# Patient Record
Sex: Female | Born: 1970 | Race: White | Hispanic: No | Marital: Married | Smoking: Never smoker
Health system: Southern US, Community
[De-identification: ages and names within clinical notes are randomized; demographics above are authoritative.]

## PROBLEM LIST (undated history)

## (undated) DIAGNOSIS — I10 Essential (primary) hypertension: Secondary | ICD-10-CM

## (undated) DIAGNOSIS — F32A Depression, unspecified: Secondary | ICD-10-CM

## (undated) DIAGNOSIS — E119 Type 2 diabetes mellitus without complications: Secondary | ICD-10-CM

## (undated) DIAGNOSIS — R112 Nausea with vomiting, unspecified: Secondary | ICD-10-CM

## (undated) DIAGNOSIS — Z9889 Other specified postprocedural states: Secondary | ICD-10-CM

## (undated) DIAGNOSIS — K219 Gastro-esophageal reflux disease without esophagitis: Secondary | ICD-10-CM

## (undated) HISTORY — DX: Gastro-esophageal reflux disease without esophagitis: K21.9

## (undated) HISTORY — PX: DILATION AND CURETTAGE OF UTERUS: SHX78

## (undated) HISTORY — DX: Depression, unspecified: F32.A

## (undated) HISTORY — PX: CHOLECYSTECTOMY: SHX55

---

## 2010-05-22 HISTORY — PX: KNEE ARTHROSCOPY W/ MENISCAL REPAIR: SHX1877

## 2018-01-10 MED FILL — UNIFINE PENTIPS 32GX5/32: 32G X 4 MM | 90 days supply | Qty: 300 | Fill #0

## 2018-01-10 MED FILL — METOPROLOL TARTRATE 50 MG T: 50 | 90 days supply | Qty: 180 | Fill #0

## 2018-01-10 MED FILL — HYDROCHLOROTHIAZIDE 12.5 MG: 12.5 | 90 days supply | Qty: 90 | Fill #0

## 2018-01-10 MED FILL — TELMISARTAN 20 MG TABS: 20 | 90 days supply | Qty: 90 | Fill #0

## 2018-01-10 MED FILL — UNIFINE PENTIPS 32GX5/32": 32G X 4 MM | 90 days supply | Qty: 300 | Fill #0

## 2018-02-11 MED FILL — ATORVASTATIN CALCIUM 20 MG: 20 | 90 days supply | Qty: 90 | Fill #0

## 2018-02-25 MED FILL — TRULICITY 1.5 MG/0.5 ML PEN: 1.5 | 28 days supply | Qty: 2 | Fill #0

## 2018-03-03 MED FILL — INVOKANA 300 MG TABLET: 300 | 90 days supply | Qty: 90 | Fill #0

## 2018-03-28 MED FILL — metFORMIN HCL 1000 MG TABS: 1000 | 90 days supply | Qty: 180 | Fill #0

## 2018-03-28 MED FILL — TRULICITY 1.5 MG/0.5 ML PEN: 1.5 | 28 days supply | Qty: 2 | Fill #1

## 2018-03-29 MED FILL — LANTUS SOLOSTAR 100 UNITS/M: 100 | 30 days supply | Qty: 30 | Fill #0

## 2018-03-29 MED FILL — GLIMEPIRIDE 4 MG TABLET: 4 | 90 days supply | Qty: 180 | Fill #0

## 2018-03-29 MED FILL — METOPROLOL TARTRATE 50 MG T: 50 | 30 days supply | Qty: 60 | Fill #0

## 2018-04-28 ENCOUNTER — Other Ambulatory Visit: Payer: Self-pay

## 2018-04-28 ENCOUNTER — Ambulatory Visit (INDEPENDENT_AMBULATORY_CARE_PROVIDER_SITE_OTHER): Payer: No Typology Code available for payment source | Admitting: Internal Medicine

## 2018-04-28 ENCOUNTER — Encounter: Payer: Self-pay | Admitting: Internal Medicine

## 2018-04-28 VITALS — BP 116/61 | HR 66 | Temp 98.2°F | Wt 245.0 lb

## 2018-04-28 DIAGNOSIS — E1159 Type 2 diabetes mellitus with other circulatory complications: Secondary | ICD-10-CM | POA: Insufficient documentation

## 2018-04-28 DIAGNOSIS — Z6839 Body mass index (BMI) 39.0-39.9, adult: Secondary | ICD-10-CM

## 2018-04-28 DIAGNOSIS — I10 Essential (primary) hypertension: Secondary | ICD-10-CM

## 2018-04-28 DIAGNOSIS — M7918 Myalgia, other site: Secondary | ICD-10-CM

## 2018-04-28 DIAGNOSIS — E785 Hyperlipidemia, unspecified: Secondary | ICD-10-CM | POA: Diagnosis not present

## 2018-04-28 DIAGNOSIS — E119 Type 2 diabetes mellitus without complications: Secondary | ICD-10-CM

## 2018-04-28 DIAGNOSIS — E1142 Type 2 diabetes mellitus with diabetic polyneuropathy: Secondary | ICD-10-CM | POA: Diagnosis not present

## 2018-04-28 DIAGNOSIS — F411 Generalized anxiety disorder: Secondary | ICD-10-CM

## 2018-04-28 DIAGNOSIS — Z0000 Encounter for general adult medical examination without abnormal findings: Secondary | ICD-10-CM

## 2018-04-28 DIAGNOSIS — E1149 Type 2 diabetes mellitus with other diabetic neurological complication: Secondary | ICD-10-CM | POA: Insufficient documentation

## 2018-04-28 DIAGNOSIS — I152 Hypertension secondary to endocrine disorders: Secondary | ICD-10-CM | POA: Insufficient documentation

## 2018-04-28 DIAGNOSIS — E538 Deficiency of other specified B group vitamins: Secondary | ICD-10-CM

## 2018-04-28 DIAGNOSIS — K219 Gastro-esophageal reflux disease without esophagitis: Secondary | ICD-10-CM

## 2018-04-28 DIAGNOSIS — Z794 Long term (current) use of insulin: Secondary | ICD-10-CM

## 2018-04-28 DIAGNOSIS — J45909 Unspecified asthma, uncomplicated: Secondary | ICD-10-CM

## 2018-04-28 DIAGNOSIS — Z975 Presence of (intrauterine) contraceptive device: Secondary | ICD-10-CM

## 2018-04-28 DIAGNOSIS — E1169 Type 2 diabetes mellitus with other specified complication: Secondary | ICD-10-CM | POA: Insufficient documentation

## 2018-04-28 LAB — POCT GLYCOSYLATED HEMOGLOBIN (HGB A1C): Hemoglobin A1C: 6.7 % — AB (ref 4.0–5.6)

## 2018-04-28 LAB — GLUCOSE, CAPILLARY: Glucose-Capillary: 93 mg/dL (ref 70–99)

## 2018-04-28 MED ORDER — MELOXICAM 15 MG PO TABS: 15.0000 mg | ORAL_TABLET | Freq: Every day | ORAL | 1 refills | Status: DC | PRN

## 2018-04-28 MED ORDER — ATORVASTATIN CALCIUM 20 MG PO TABS: 20.0000 mg | ORAL_TABLET | Freq: Every day | ORAL | 3 refills | Status: DC

## 2018-04-28 MED ORDER — METOPROLOL TARTRATE 50 MG PO TABS: 50.0000 mg | ORAL_TABLET | Freq: Two times a day (BID) | ORAL | 3 refills | Status: DC

## 2018-04-28 MED ORDER — OLMESARTAN MEDOXOMIL 20 MG PO TABS: 20.0000 mg | ORAL_TABLET | Freq: Every day | ORAL | 3 refills | Status: DC

## 2018-04-28 MED ORDER — GLIMEPIRIDE 4 MG PO TABS: 4.0000 mg | ORAL_TABLET | Freq: Every day | ORAL | 3 refills | Status: DC

## 2018-04-28 MED ORDER — INSULIN GLARGINE 100 UNIT/ML SOLOSTAR PEN: PEN_INJECTOR | SUBCUTANEOUS | 3 refills | Status: DC

## 2018-04-28 MED ORDER — HYDROCHLOROTHIAZIDE 25 MG PO TABS: 25.0000 mg | ORAL_TABLET | Freq: Every day | ORAL | 3 refills | Status: DC

## 2018-04-28 MED ORDER — ALBUTEROL SULFATE 108 (90 BASE) MCG/ACT IN AEPB: 2.0000 | INHALATION_SPRAY | RESPIRATORY_TRACT | 4 refills | Status: DC | PRN

## 2018-04-28 MED ORDER — DULAGLUTIDE 1.5 MG/0.5ML ~~LOC~~ SOAJ: 1.5000 mg | SUBCUTANEOUS | 3 refills | Status: DC

## 2018-04-28 MED ORDER — BETAMETHASONE DIPROPIONATE 0.05 % EX CREA: TOPICAL_CREAM | Freq: Two times a day (BID) | CUTANEOUS | 0 refills | Status: DC

## 2018-04-28 MED ORDER — OMEPRAZOLE 20 MG PO CPDR: 20.0000 mg | DELAYED_RELEASE_CAPSULE | Freq: Every day | ORAL | 3 refills | Status: DC

## 2018-04-28 MED ORDER — METFORMIN HCL 1000 MG PO TABS: 1000.0000 mg | ORAL_TABLET | Freq: Two times a day (BID) | ORAL | 3 refills | Status: DC

## 2018-04-28 MED FILL — ATORVASTATIN CALCIUM 20 MG: 20 | 90 days supply | Qty: 90 | Fill #0

## 2018-04-28 MED FILL — OLMESARTAN MEDOXOMIL 20 MG: 20 | 90 days supply | Qty: 90 | Fill #0

## 2018-04-28 MED FILL — OMEPRAZOLE 20 MG CPDR: 20 | 90 days supply | Qty: 90 | Fill #0

## 2018-04-28 MED FILL — VENTOLIN HFA 90 MCG INHALER: 108 (90 BAS | 30 days supply | Qty: 18 | Fill #0

## 2018-04-28 MED FILL — HYDROCHLOROTHIAZIDE 25 MG T: 25 | 90 days supply | Qty: 90 | Fill #0

## 2018-04-28 MED FILL — BETAMETHASONE DP 0.05% CRM: 0.05 | 10 days supply | Qty: 30 | Fill #0

## 2018-04-28 MED FILL — METOPROLOL TARTRATE 50 MG T: 50 | 90 days supply | Qty: 180 | Fill #0

## 2018-04-28 MED FILL — TRULICITY 1.5 MG/0.5 ML PEN: 1.5 | 84 days supply | Qty: 6 | Fill #0

## 2018-04-28 MED FILL — LANTUS SOLOSTAR 100 UNITS/M: 100 | 28 days supply | Qty: 30 | Fill #0

## 2018-04-28 MED FILL — MELOXICAM 15 MG TABLET: 15 | 90 days supply | Qty: 90 | Fill #0
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
## 2018-04-28 NOTE — Assessment & Plan Note (Signed)
This problem is chronic and stable.  She is having no issues with the IUD but would like to get back into gynecological care for routine care.  PLAN : refer to gyn

## 2018-04-28 NOTE — Assessment & Plan Note (Signed)
This problem is chronic and stable.  Her B12 level was 227 in March 2019.  Subsequently, she started taking oral over-the-counter B12 supplements.  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  PLAN : Continue B12 supplements Check B12 level next appointment

## 2018-04-28 NOTE — Assessment & Plan Note (Signed)
This problem is chronic and stable.  She does not have a history of asthma or COPD.  However, she states anytime she gets a URI that it goes into her lower pulmonary tract and causes a pneumonia.  The last time she thinks was about 5 years ago but she has had other episodes of pneumonia.  She likes to keep the albuterol inhaler to help whenever she gets a URI to help with the wheezing and the symptoms.  PLAN : albuterol MDI PRN

## 2018-04-28 NOTE — Assessment & Plan Note (Signed)
This problem is chronic and stable.  She is taking omeprazole 20 mg daily over-the-counter and request that it be switched to prescription.  She is having no side effects to this medication and it is controlling her symptoms well.  PLAN : omeprazole 20 QD

## 2018-04-28 NOTE — Assessment & Plan Note (Addendum)
This problem is chronic and stable.  Her BMI is about 39.  Associated comorbidities include diabetes, hypertension, and hyperlipidemia.  She did mention that she knows and wants to lose weight but we were not able to get into the specifics today.  PLAN : follow

## 2018-04-28 NOTE — Patient Instructions (Addendum)
1. Think about continuous glucose monitoring 2. I referred you to Gynecology and optometry 3. I will refill your medicines this afternoon 4. Let me know when you need a xanax refill 5. Use the steroid cream only on the lesion - not on normal skin - for 2 weeks 6. Let me know if you need a new meter

## 2018-04-29 DIAGNOSIS — M7918 Myalgia, other site: Secondary | ICD-10-CM | POA: Insufficient documentation

## 2018-04-29 DIAGNOSIS — Z0000 Encounter for general adult medical examination without abnormal findings: Secondary | ICD-10-CM | POA: Insufficient documentation

## 2018-04-29 NOTE — Assessment & Plan Note (Signed)
This problem is chronic and stable.  She is on atorvastatin 20 in the morning, a moderate intensity statin for primary prevention.  Her 10-year cardiovascular risk is 7.26%, just below the cutoff of 7.5 for a high intensity statin.  She is having no myalgias to her atorvastatin.  PLAN:  Cont current meds

## 2018-04-29 NOTE — Assessment & Plan Note (Signed)
This problem is chronic and stable.  She is on olmesartan 20 daily, HCTZ 25 daily, and metoprolol 50 twice daily.  Her blood pressure is at goal.  She has no side effects to these medications.  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  I would like to discuss changing her metoprolol to daily dosing to simplify dosing regimen.  PLAN:  Cont current meds   BP Readings from Last 3 Encounters:  04/28/18 116/61

## 2018-04-29 NOTE — Assessment & Plan Note (Signed)
This problem is chronic and stable.  She uses Mobic 15 mg as needed for joint pain and requesting refill.  She is on a PPI and her creatinine is normal.  She had right knee arthroscopic for a meniscus repair in the past but now it is her left knee that is hurting.  She also has other arthralgias and myalgias and uses a heating pad to her lower back at time which helps with the back pain.  She has been getting massage therapy which helps with the myalgias, arthralgias, neuropathy, and stress and requests a prescription so that she can use her HSA to cover the cost of the sessions which I am providing.  PLAN : mobic prn Prescription for massage therapy

## 2018-04-29 NOTE — Assessment & Plan Note (Signed)
She is gotten 1 mammogram 2 years ago that required additional detail images but overall was normal.  She is being referred to gynecology.  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  She has injection site reactions across her abdomen and I am going to use a medium potency topical corticosteroid to treat those.

## 2018-04-29 NOTE — Assessment & Plan Note (Signed)
This problem is chronic and stable.  Her previous provider prescribed alprazolam 0.5 and she still has 15 pills left over.  Her anxiety has recently increased due to several reasons.  Her anxiety has primarily affected her sleep.  She uses the alprazolam very infrequently.  She has started counseling and has found that useful.  We discussed that I would be willing to refill the alprazolam over the phone if she needs a refill before her next appointment.  There is no need to get a contract or UDS at this point unless the usage increases.  She had tried Effexor in the past but states that it caused symptomatic elevated serotonin levels which she described as being manic and having increased brain activity.  I will need to keep this in mind if treatment needs to be escalated.  I did enter it into the allergy section.  PLAN : follow alprazolam usage I will refill alprazolam 0.5 if needed before her next appointment

## 2018-04-29 NOTE — Assessment & Plan Note (Signed)
This problem is chronic and stable.  Her regimen includes Trulicity (GLP) 1.5 but she has been out of this 2 months, metformin 1000 BID, and glimepiride 4 mg BID.  She also uses Lantus 50 QHS.  Her endocrinologist had her take an additional 50 units in the morning if her CBG was greater than 150.  She checks her CBGs infrequently and they range from 128 - 163.  These are a mix of fasting and postprandial.  Her A1c today is 6.7 and she was thrilled with the results.  She notices slight hypoglycemic symptoms anytime her CBG is less than 90.  We did not make any changes to her regimen today but I would consider stopping the glimepiride due to the associated weight gain and the fact that she already needs a higher dose of insulin.  I could substitute an SGLT2 but she does not have a history of frequent UTIs / genital infections.  I also do not like as needed Lantus especially I had a high dose.  We discussed CGM as I think that would help to fine-tune her regimen and she is going to think about it.  PLAN : Optometry referral Discuss stopping glimepiride at next appointment Discuss treatment options that do not include PRN morning insulin Consider SGLT2 Follow-up in 3 months

## 2018-05-06 ENCOUNTER — Ambulatory Visit (INDEPENDENT_AMBULATORY_CARE_PROVIDER_SITE_OTHER): Payer: Self-pay | Admitting: Nurse Practitioner

## 2018-05-06 VITALS — BP 110/75 | HR 75 | Temp 98.3°F | Resp 16 | Wt 244.0 lb

## 2018-05-06 DIAGNOSIS — J029 Acute pharyngitis, unspecified: Secondary | ICD-10-CM

## 2018-05-06 DIAGNOSIS — H938X3 Other specified disorders of ear, bilateral: Secondary | ICD-10-CM

## 2018-05-06 MED ORDER — FLUTICASONE PROPIONATE 50 MCG/ACT NA SUSP: 2.0000 | Freq: Every day | NASAL | 0 refills | Status: DC

## 2018-05-06 NOTE — Patient Instructions (Signed)
Sore Throat -Use medication as prescribed. -Ibuprofen or Tylenol for pain, fever, or general discomfort. -Increase fluids. -Get plenty of rest. -Take Vitamin C or zinc to build immune system. -Warm saltwater gargles 3-4 times daily to help with throat discomfort. -May use a teaspoon of honey (use sparingly to prevent hyperglycemia) or over-the-counter cough drops to help with sore throat. -Follow-up if symptoms worsen.   A sore throat is pain, burning, irritation, or scratchiness in the throat. When you have a sore throat, you may feel pain or tenderness in your throat when you swallow or talk. Many things can cause a sore throat, including:  An infection.  Seasonal allergies.  Dryness in the air.  Irritants, such as smoke or pollution.  Radiation treatment to the area.  Gastroesophageal reflux disease (GERD).  A tumor. A sore throat is often the first sign of another sickness. It may happen with other symptoms, such as coughing, sneezing, fever, and swollen neck glands. Most sore throats go away without medical treatment. Follow these instructions at home:      Take over-the-counter medicines only as told by your health care provider. ? If your child has a sore throat, do not give your child aspirin because of the association with Reye syndrome.  Drink enough fluids to keep your urine pale yellow.  Rest as needed.  To help with pain, try: ? Sipping warm liquids, such as broth, herbal tea, or warm water. ? Eating or drinking cold or frozen liquids, such as frozen ice pops. ? Gargling with a salt-water mixture 3-4 times a day or as needed. To make a salt-water mixture, completely dissolve -1 tsp (3-6 g) of salt in 1 cup (237 mL) of warm water. ? Sucking on hard candy or throat lozenges. ? Putting a cool-mist humidifier in your bedroom at night to moisten the air. ? Sitting in the bathroom with the door closed for 5-10 minutes while you run hot water in the shower.  Do  not use any products that contain nicotine or tobacco, such as cigarettes, e-cigarettes, and chewing tobacco. If you need help quitting, ask your health care provider.  Wash your hands well and often with soap and water. If soap and water are not available, use hand sanitizer. Contact a health care provider if:  You have a fever for more than 2-3 days.  You have symptoms that last (are persistent) for more than 2-3 days.  Your throat does not get better within 7 days.  You have a fever and your symptoms suddenly get worse.  Your child who is 3 months to 84 years old has a temperature of 102.61F (39C) or higher. Get help right away if:  You have difficulty breathing.  You cannot swallow fluids, soft foods, or your saliva.  You have increased swelling in your throat or neck.  You have persistent nausea and vomiting. Summary  A sore throat is pain, burning, irritation, or scratchiness in the throat. Many things can cause a sore throat.  Take over-the-counter medicines only as told by your health care provider. Do not give your child aspirin.  Drink plenty of fluids, and rest as needed.  Contact a health care provider if your symptoms worsen or your sore throat does not get better within 7 days. This information is not intended to replace advice given to you by your health care provider. Make sure you discuss any questions you have with your health care provider. Document Released: 04/16/2004 Document Revised: 08/09/2017 Document Reviewed: 08/09/2017 Elsevier  Interactive Patient Education  2019 [employer]  Ear Fullness  Ear fullness and pressure can be caused by a condition in which a blockage develops in the narrow passage that connects the middle ear to the back of the nose (eustachian tube). The eustachian tube regulates air pressure in the middle ear by letting air move between the ear and nose. It also helps to drain fluid from the middle ear space. Eustachian tube  dysfunction can affect one or both ears. When the eustachian tube does not function properly, air pressure, fluid, or both can build up in the middle ear. What are the causes? This condition occurs when the eustachian tube becomes blocked or cannot open normally. Common causes of this condition include:  Ear infections.  Colds and other infections that affect the nose, mouth, and throat (upper respiratory tract).  Allergies.  Irritation from cigarette smoke.  Irritation from stomach acid coming up into the esophagus (gastroesophageal reflux). The esophagus is the tube that carries food from the mouth to the stomach.  Sudden changes in air pressure, such as from descending in an airplane or scuba diving.  Abnormal growths in the nose or throat, such as: ? Growths that line the nose (nasal polyps). ? Abnormal growth of cells (tumors). ? Enlarged tissue at the back of the throat (adenoids). What increases the risk? You are more likely to develop this condition if:  You smoke.  You are overweight.  You are a child who has: ? Certain birth defects of the mouth, such as cleft palate. ? Large tonsils or adenoids. What are the signs or symptoms? Common symptoms of this condition include:  A feeling of fullness in the ear.  Ear pain.  Clicking or popping noises in the ear.  Ringing in the ear.  Hearing loss.  Loss of balance.  Dizziness. Symptoms may get worse when the air pressure around you changes, such as when you travel to an area of high elevation, fly on an airplane, or go scuba diving. How is this diagnosed? This condition may be diagnosed based on:  Your symptoms.  A physical exam of your ears, nose, and throat.  Tests, such as those that measure: ? The movement of your eardrum (tympanogram). ? Your hearing (audiometry). How is this treated? Treatment depends on the cause and severity of your condition.  In mild cases, you may relieve your symptoms by  moving air into your ears. This is called "popping the ears."  In more severe cases, or if you have symptoms of fluid in your ears, treatment may include: ? Medicines to relieve congestion (decongestants). ? Medicines that treat allergies (antihistamines). ? Nasal sprays or ear drops that contain medicines that reduce swelling (steroids). ? A procedure to drain the fluid in your eardrum (myringotomy). In this procedure, a small tube is placed in the eardrum to:  Drain the fluid.  Restore the air in the middle ear space. ? A procedure to insert a balloon device through the nose to inflate the opening of the eustachian tube (balloon dilation). Follow these instructions at home: Lifestyle  Do not do any of the following until your health care provider approves: ? Travel to high altitudes. ? Fly in airplanes. ? Work in a Estate agentpressurized cabin or room. ? Scuba dive.  Do not use any products that contain nicotine or tobacco, such as cigarettes and e-cigarettes. If you need help quitting, ask your health care provider.  Keep your ears dry. Wear fitted earplugs during showering and  bathing. Dry your ears completely after. General instructions  Take over-the-counter and prescription medicines only as told by your health care provider.  Use techniques to help pop your ears as recommended by your health care provider. These may include: ? Chewing gum. ? Yawning. ? Frequent, forceful swallowing. ? Closing your mouth, holding your nose closed, and gently blowing as if you are trying to blow air out of your nose.  Keep all follow-up visits as told by your health care provider. This is important. Contact a health care provider if:  Your symptoms do not go away after treatment.  Your symptoms come back after treatment.  You are unable to pop your ears.  You have: ? A fever. ? Pain in your ear. ? Pain in your head or neck. ? Fluid draining from your ear.  Your hearing suddenly  changes.  You become very dizzy.  You lose your balance. Summary  Eustachian tube dysfunction refers to a condition in which a blockage develops in the eustachian tube.  It can be caused by ear infections, allergies, inhaled irritants, or abnormal growths in the nose or throat.  Symptoms include ear pain, hearing loss, or ringing in the ears.  Mild cases are treated with maneuvers to unblock the ears, such as yawning or ear popping.  Severe cases are treated with medicines. Surgery may also be done (rare). This information is not intended to replace advice given to you by your health care provider. Make sure you discuss any questions you have with your health care provider. Document Released: 04/05/2015 Document Revised: 06/29/2017 Document Reviewed: 06/29/2017 Elsevier Interactive Patient Education  2019 [employer].

## 2018-05-06 NOTE — Progress Notes (Signed)
Subjective:     Kelly Gallagher is a 48 y.o. female who presents for evaluation of sore throat. Associated symptoms include sore throat and "ears popped earlier today" . Onset of symptoms was approximately 30 min to one hour ago, and have been unchanged since that time. She is drinking plenty of fluids. She has not had a recent close exposure to someone with proven streptococcal pharyngitis.  The following portions of the patient's history were reviewed and updated as appropriate: allergies, current medications and past medical history.  Review of Systems Constitutional: negative Eyes: negative Ears, nose, mouth, throat, and face: positive for sore throat and ear fullness, bilaterally, negative for ear drainage, earaches, hoarseness and nasal congestion Respiratory: negative Cardiovascular: negative Gastrointestinal: negative Neurological: negative Allergic/Immunologic: negative    Objective:    BP 110/75 (BP Location: Right Arm, Patient Position: Sitting, Cuff Size: Large)   Pulse 75   Temp 98.3 F (36.8 C) (Oral)   Resp 16   Wt 244 lb (110.7 kg)   SpO2 97%  Physical Exam Vitals signs reviewed.  Constitutional:      General: She is not in acute distress. HENT:     Head: Normocephalic.     Right Ear: Tympanic membrane and ear canal normal. No middle ear effusion. Tympanic membrane is not erythematous.     Left Ear: Ear canal normal.  No middle ear effusion. Tympanic membrane is not erythematous.     Nose: No congestion or rhinorrhea.     Right Sinus: Maxillary sinus tenderness (mild) and frontal sinus tenderness (mild) present.     Left Sinus: Maxillary sinus tenderness (mild) and frontal sinus tenderness (mild) present.     Mouth/Throat:     Mouth: Mucous membranes are moist.     Pharynx: Posterior oropharyngeal erythema present. No pharyngeal swelling, oropharyngeal exudate or uvula swelling.     Tonsils: No tonsillar exudate or tonsillar abscesses. Swelling: 0 on the right. 0  on the left.  Eyes:     Conjunctiva/sclera: Conjunctivae normal.     Pupils: Pupils are equal, round, and reactive to light.  Neck:     Musculoskeletal: Normal range of motion and neck supple.  Cardiovascular:     Rate and Rhythm: Normal rate and regular rhythm.     Heart sounds: Normal heart sounds.  Pulmonary:     Effort: Pulmonary effort is normal.     Breath sounds: Normal breath sounds.  Abdominal:     General: Bowel sounds are normal.     Palpations: Abdomen is soft.     Tenderness: There is no abdominal tenderness.  Lymphadenopathy:     Cervical: No cervical adenopathy.  Skin:    General: Skin is warm and dry.     Capillary Refill: Capillary refill takes less than 2 seconds.  Neurological:     General: No focal deficit present.     Mental Status: She is alert and oriented to person, place, and time.  Psychiatric:        Mood and Affect: Mood normal.        Behavior: Behavior normal.    Laboratory Strep test not done. Results:n/a.    Assessment:  Sore Throat Bilateral Ear Fullness   Plan:   Exam findings, diagnosis etiology and medication use and indications reviewed with patient. Follow- Up and discharge instructions provided. No emergent/urgent issues found on exam.  Based on the patient's clinical presentation, symptoms, duration of symptoms, and physical exam, patient's findings are congruent with early stages of  a viral etiology.  Discussed with patient how viruses work.  Informed her that right now her physical exam is unremarkable, however she could worsen over the next 48 to 72 hours.  Informed patient that if symptoms worsen or more symptoms develop, will like her to return to our office for possible testing to include strep or influenza.  Will provide the patient with symptomatic treatment for her mild sinus pressure/tenderness and ear fullness which will include fluticasone.  Discussed with patient to use sparingly and to also be aware of her blood sugars when  on this medication.  Patient will also increase fluids, get plenty rest, also recommended vitamin C or zinc to help build immunity.  Patient education was provided. Patient verbalized understanding of information provided and agrees with plan of care (POC), all questions answered. The patient is advised to call or return to clinic if condition does not see an improvement in symptoms, or to seek the care of the closest emergency department if condition worsens with the above plan.   1. Sore throat  -Use medication as prescribed. -Ibuprofen or Tylenol for pain, fever, or general discomfort. -Increase fluids. -Get plenty of rest. -Take Vitamin C or zinc to build immune system. -Warm saltwater gargles 3-4 times daily to help with throat discomfort. -May use a teaspoon of honey (use sparingly to prevent hyperglycemia) or over-the-counter cough drops to help with sore throat. -Follow-up if symptoms worsen.   2. Ear fullness, bilateral  - fluticasone (FLONASE) 50 MCG/ACT nasal spray; Place 2 sprays into both nostrils daily for 10 days.  Dispense: 16 g; Refill: 0

## 2018-06-03 ENCOUNTER — Other Ambulatory Visit: Payer: Self-pay

## 2018-06-03 MED ORDER — INVOKANA 300 MG PO TABS: 300.0000 mg | ORAL_TABLET | Freq: Every day | ORAL | 3 refills | Status: DC

## 2018-06-03 MED FILL — INVOKANA 300 MG TABLET: 300 | 90 days supply | Qty: 90 | Fill #0

## 2018-06-03 NOTE — Telephone Encounter (Signed)
INVOKANA 300 MG TABS tablet, refill request @  [facility] [place], [place] - [address] Cinda Quest [phone] (Phone) [phone] (Fax)   Pt would like this med by today, states she will be out of town @ 2:00pm.

## 2018-06-04 ENCOUNTER — Other Ambulatory Visit: Payer: Self-pay | Admitting: Nurse Practitioner

## 2018-06-04 DIAGNOSIS — H938X3 Other specified disorders of ear, bilateral: Secondary | ICD-10-CM

## 2018-06-10 ENCOUNTER — Encounter: Payer: No Typology Code available for payment source | Admitting: Family Medicine

## 2018-07-07 MED FILL — LANTUS SOLOSTAR 100 UNITS/M: 100 | 28 days supply | Qty: 30 | Fill #1

## 2018-07-08 MED FILL — UNIFINE PENTIPS 32GX5/32: 32G X 4 MM | 90 days supply | Qty: 300 | Fill #0

## 2018-07-08 MED FILL — UNIFINE PENTIPS 32GX5/32": 32G X 4 MM | 90 days supply | Qty: 300 | Fill #0

## 2018-07-15 ENCOUNTER — Encounter: Payer: No Typology Code available for payment source | Admitting: Family Medicine

## 2018-07-18 MED FILL — METOPROLOL TARTRATE 50 MG T: 50 | 90 days supply | Qty: 180 | Fill #1

## 2018-07-18 MED FILL — GLIMEPIRIDE 4 MG TABLET: 4 | 90 days supply | Qty: 90 | Fill #0

## 2018-07-18 MED FILL — HYDROCHLOROTHIAZIDE 25 MG T: 25 | 90 days supply | Qty: 90 | Fill #1

## 2018-07-18 MED FILL — TRULICITY 1.5 MG/0.5 ML PEN: 1.5 | 84 days supply | Qty: 6 | Fill #1

## 2018-07-18 MED FILL — OMEPRAZOLE 20 MG CPDR: 20 | 90 days supply | Qty: 90 | Fill #1

## 2018-07-18 MED FILL — ATORVASTATIN 20 MG TABLET: 20 | 90 days supply | Qty: 90 | Fill #1

## 2018-07-18 MED FILL — MELOXICAM 15 MG TABLET: 15 | 90 days supply | Qty: 90 | Fill #1

## 2018-07-18 MED FILL — OLMESARTAN MEDOXOMIL 20 MG: 20 | 30 days supply | Qty: 30 | Fill #1

## 2018-07-27 ENCOUNTER — Encounter: Payer: Self-pay | Admitting: *Deleted

## 2018-08-02 ENCOUNTER — Other Ambulatory Visit: Payer: Self-pay | Admitting: Internal Medicine

## 2018-08-02 ENCOUNTER — Encounter: Payer: Self-pay | Admitting: Nurse Practitioner

## 2018-08-02 ENCOUNTER — Encounter: Payer: Self-pay | Admitting: Internal Medicine

## 2018-08-02 MED ORDER — GLIMEPIRIDE 4 MG PO TABS: 4.0000 mg | ORAL_TABLET | Freq: Two times a day (BID) | ORAL | 3 refills | Status: DC

## 2018-08-04 MED FILL — LANTUS SOLOSTAR 100 UNITS/M: 100 | 28 days supply | Qty: 30 | Fill #2

## 2018-08-05 MED FILL — metFORMIN HCL 1000 MG TABS: 1000 | 90 days supply | Qty: 180 | Fill #0

## 2018-08-17 MED FILL — OLMESARTAN MEDOXOMIL 20 MG: 20 | 30 days supply | Qty: 30 | Fill #2

## 2018-08-24 ENCOUNTER — Ambulatory Visit: Payer: No Typology Code available for payment source | Admitting: Obstetrics & Gynecology

## 2018-08-24 ENCOUNTER — Ambulatory Visit (INDEPENDENT_AMBULATORY_CARE_PROVIDER_SITE_OTHER): Payer: No Typology Code available for payment source | Admitting: Obstetrics & Gynecology

## 2018-08-24 ENCOUNTER — Encounter: Payer: Self-pay | Admitting: Obstetrics & Gynecology

## 2018-08-24 ENCOUNTER — Other Ambulatory Visit: Payer: Self-pay

## 2018-08-24 VITALS — BP 112/70 | HR 65 | Ht 65.0 in | Wt 246.0 lb

## 2018-08-24 DIAGNOSIS — Z01419 Encounter for gynecological examination (general) (routine) without abnormal findings: Secondary | ICD-10-CM | POA: Diagnosis not present

## 2018-08-24 DIAGNOSIS — N952 Postmenopausal atrophic vaginitis: Secondary | ICD-10-CM

## 2018-08-24 NOTE — Progress Notes (Signed)
Subjective:     Kelly Gallagher is a 48 y.o. female here for a routine exam.  Current complaints: Pt reports pain with intercourse. Requires lubrication which spouse does not prefer. The sx are getting worse. She has not attempted tx outside of lubrication.       Gynecologic History No LMP recorded. Contraception: IUD Last Pap:due Last mammogram: 2016. Results were: normal  Obstetric History OB History  Gravida Para Term Preterm AB Living  1       1    SAB TAB Ectopic Multiple Live Births  1            # Outcome Date GA Lbr Len/2nd Weight Sex Delivery Anes PTL Lv  1 SAB             Obstetric Comments  "chemical pregnancy"     The following portions of the patient's history were reviewed and updated as appropriate: allergies, current medications, past family history, past medical history, past social history, past surgical history and problem list.  Review of Systems Pertinent items are noted in HPI.    Objective:  BP 112/70   Pulse 65   Ht 5\' 5"  (1.651 m)   Wt 246 lb (111.6 kg)   BMI 40.94 kg/m  General Appearance:    Alert, cooperative, no distress, appears stated age  Head:    Normocephalic, without obvious abnormality, atraumatic  Eyes:    conjunctiva/corneas clear, EOM's intact, both eyes  Ears:    Normal external ear canals, both ears  Nose:   Nares normal, septum midline, mucosa normal, no drainage    or sinus tenderness  Throat:   Lips, mucosa, and tongue normal; teeth and gums normal  Neck:   Supple, symmetrical, trachea midline, no adenopathy;    thyroid:  no enlargement/tenderness/nodules  Back:     Symmetric, no curvature, ROM normal, no CVA tenderness  Lungs:      respirations unlabored  Chest Wall:    No tenderness or deformity   Heart:    Regular rate and rhythm  Breast Exam:    No tenderness, masses, or nipple abnormality  Abdomen:     Soft, non-tender, bowel sounds active all four quadrants,    no masses, no organomegaly  Genitalia:    Normal female  without lesion, discharge or tenderness; no significant atrophy noted. Decreased lubrication       Extremities:   Extremities normal, atraumatic, no cyanosis or edema  Pulses:   2+ and symmetric all extremities  Skin:   Skin color, texture, turgor normal, no rashes or lesions      Assessment:    Healthy female exam.   Atrophic vaginitis.    Plan:  F/u PAP Screening mammogram  F/u in 1 year or sooner prn Coconut oil to vaginal introitus bid  F/u via virtual visit in 3 months to discuss progress with atrophy   Myrella Fahs L. Harraway-Smith, M.D., [place] [place]

## 2018-08-24 NOTE — Progress Notes (Signed)
Has had Mirena IUD since March 2016. Armandina Stammer RN

## 2018-08-25 ENCOUNTER — Encounter: Payer: Self-pay | Admitting: Obstetrics & Gynecology

## 2018-08-26 LAB — CYTOLOGY - PAP
Diagnosis: NEGATIVE
HPV: NOT DETECTED

## 2018-08-29 NOTE — Addendum Note (Signed)
Addended by: Hulan Fray on: 08/29/2018 08:47 AM   Modules accepted: Orders

## 2018-09-05 MED FILL — INVOKANA 300 MG TABLET: 300 | 90 days supply | Qty: 90 | Fill #1

## 2018-09-08 ENCOUNTER — Encounter: Payer: Self-pay | Admitting: Internal Medicine

## 2018-09-08 ENCOUNTER — Ambulatory Visit: Payer: No Typology Code available for payment source | Admitting: Pharmacist

## 2018-09-08 ENCOUNTER — Telehealth: Payer: Self-pay | Admitting: *Deleted

## 2018-09-08 ENCOUNTER — Other Ambulatory Visit: Payer: Self-pay

## 2018-09-08 ENCOUNTER — Ambulatory Visit (INDEPENDENT_AMBULATORY_CARE_PROVIDER_SITE_OTHER): Payer: No Typology Code available for payment source | Admitting: Internal Medicine

## 2018-09-08 VITALS — BP 105/65 | HR 58 | Temp 98.8°F | Ht 65.0 in | Wt 247.1 lb

## 2018-09-08 DIAGNOSIS — E119 Type 2 diabetes mellitus without complications: Secondary | ICD-10-CM

## 2018-09-08 DIAGNOSIS — M7918 Myalgia, other site: Secondary | ICD-10-CM | POA: Diagnosis not present

## 2018-09-08 DIAGNOSIS — G479 Sleep disorder, unspecified: Secondary | ICD-10-CM

## 2018-09-08 DIAGNOSIS — E785 Hyperlipidemia, unspecified: Secondary | ICD-10-CM

## 2018-09-08 DIAGNOSIS — I10 Essential (primary) hypertension: Secondary | ICD-10-CM | POA: Diagnosis not present

## 2018-09-08 DIAGNOSIS — Z794 Long term (current) use of insulin: Secondary | ICD-10-CM

## 2018-09-08 DIAGNOSIS — G8929 Other chronic pain: Secondary | ICD-10-CM | POA: Diagnosis not present

## 2018-09-08 DIAGNOSIS — E1142 Type 2 diabetes mellitus with diabetic polyneuropathy: Secondary | ICD-10-CM

## 2018-09-08 DIAGNOSIS — E538 Deficiency of other specified B group vitamins: Secondary | ICD-10-CM

## 2018-09-08 DIAGNOSIS — Z0000 Encounter for general adult medical examination without abnormal findings: Secondary | ICD-10-CM

## 2018-09-08 DIAGNOSIS — Z791 Long term (current) use of non-steroidal anti-inflammatories (NSAID): Secondary | ICD-10-CM

## 2018-09-08 DIAGNOSIS — E1149 Type 2 diabetes mellitus with other diabetic neurological complication: Secondary | ICD-10-CM

## 2018-09-08 DIAGNOSIS — R42 Dizziness and giddiness: Secondary | ICD-10-CM

## 2018-09-08 DIAGNOSIS — F411 Generalized anxiety disorder: Secondary | ICD-10-CM

## 2018-09-08 DIAGNOSIS — K219 Gastro-esophageal reflux disease without esophagitis: Secondary | ICD-10-CM

## 2018-09-08 DIAGNOSIS — Z79899 Other long term (current) drug therapy: Secondary | ICD-10-CM

## 2018-09-08 LAB — POCT GLYCOSYLATED HEMOGLOBIN (HGB A1C): Hemoglobin A1C: 7.5 % — AB (ref 4.0–5.6)

## 2018-09-08 LAB — GLUCOSE, CAPILLARY: Glucose-Capillary: 137 mg/dL — ABNORMAL HIGH (ref 70–99)

## 2018-09-08 MED ORDER — METOPROLOL SUCCINATE ER 100 MG PO TB24: 100.0000 mg | ORAL_TABLET | Freq: Every day | ORAL | 3 refills | Status: DC

## 2018-09-08 MED ORDER — METFORMIN HCL ER (OSM) 1000 MG PO TB24: 2000.0000 mg | ORAL_TABLET | Freq: Every day | ORAL | 3 refills | Status: DC

## 2018-09-08 MED FILL — METOPROLOL SUCCINATE ER 100: 100 | 90 days supply | Qty: 90 | Fill #0

## 2018-09-08 NOTE — Telephone Encounter (Signed)
I called [facility] in [place] stated pt only received a flu vaccine and will fax record to the office.

## 2018-09-08 NOTE — Progress Notes (Signed)
Documentation for Freestyle Libre Pro Continuous glucose monitoring Freestyle Libre Pro CGM sensor placed today. Patient was educated about wearing sensor, keeping food, activity and medication log and when to call office. Patient was educated about how to care for the sensor and not to have an MRI, CT or Diathermy while wearing the sensor. Follow up was arranged with the patient for 1 week.   Lot #: [id number] Serial #: [id number] Expiration Date: 11/21/2018  Kelly Gallagher, PharmD 09/08/2018 10:31 AM.

## 2018-09-08 NOTE — Progress Notes (Signed)
Patient was seen today in a co-visit. See documentation under Dr. Zenovia Jarred visit for details.

## 2018-09-08 NOTE — Progress Notes (Signed)
   Subjective:    Patient ID: Kelly Gallagher, female    DOB: October 11, 1970, 48 y.o.   MRN: [id number]  HPI  Kelly Gallagher is here for insurance prevenative exam. Please see the A&P for the status of the pt's chronic medical problems.  ROS : per ROS section and in problem oriented charting. All other systems are negative.  PMHx, Soc hx, and / or Fam hx : Her sister, who has lupus, lives with her and recently became ill with a concern that it was COVID.  Kelly Gallagher was busy caring for her sister, trying to keep her father seperate to decrease risk to him.  Her sister eventually tested negative for COVID.  But this was a very stressful period for her.  Review of Systems  Constitutional: Negative for appetite change and unexpected weight change.  HENT: Negative for rhinorrhea and sneezing.   Eyes: Negative for pain and itching.  Respiratory: Negative for cough and shortness of breath.   Cardiovascular: Negative for chest pain and leg swelling.  Gastrointestinal: Negative for abdominal pain and constipation.  Genitourinary: Negative for dysuria and hematuria.  Musculoskeletal: Positive for arthralgias. Negative for myalgias.  Skin: Negative for wound.  Neurological: Positive for dizziness.  Hematological: Bruises/bleeds easily.  Psychiatric/Behavioral: Positive for sleep disturbance.       Objective:   Physical Exam Constitutional:      General: She is not in acute distress.    Appearance: Normal appearance. She is obese. She is not ill-appearing.  HENT:     Head: Normocephalic and atraumatic.     Right Ear: External ear normal.     Left Ear: External ear normal.     Nose: Nose normal. No congestion or rhinorrhea.  Eyes:     General: No scleral icterus.       Right eye: No discharge.        Left eye: No discharge.     Extraocular Movements: Extraocular movements intact.     Conjunctiva/sclera: Conjunctivae normal.  Neck:     Musculoskeletal: Normal range of motion and neck supple.   Cardiovascular:     Rate and Rhythm: Normal rate and regular rhythm.     Heart sounds: No murmur.  Pulmonary:     Effort: Pulmonary effort is normal. No respiratory distress.     Breath sounds: Normal breath sounds. No wheezing.  Abdominal:     General: Bowel sounds are normal.  Musculoskeletal: Normal range of motion.        General: No swelling or tenderness.     Comments: On the right and third finger DIP joint there is a 1 mm freely movable nodule in the skin  Skin:    General: Skin is warm and dry.     Comments: Scattered broken blood vessels lower extremities  Neurological:     General: No focal deficit present.     Mental Status: She is alert. Mental status is at baseline.  Psychiatric:        Mood and Affect: Mood normal.        Behavior: Behavior normal.        Thought Content: Thought content normal.        Judgment: Judgment normal.           Assessment & Plan:

## 2018-09-08 NOTE — Assessment & Plan Note (Addendum)
This problem is chronic and slightly worsened. A1C trend 6.7 - 7.5 today. She is on  Metformin 1000 BID Glimepiride 4 BID Lantus 50 BID Dulaglutide / Trulicity  Canagliflozin / Invokana  Her fasting CBGs are ranging mostly 150-200.  She has recently been under a lot of stress and additionally, her eating patterns changed a bit with stay-at-home recommendations.  Ideally, I would like to get her off glimepiride as I doubt it is helping much since she is requiring 100 units of Lantus a day.  We talked about options.  She is on maximum metformin, canagliflozin, and dulaglutide.  Her goal is to stay off prandial insulin which I agree with.  She had been previously doing much better in regards to her diabetes control so we elected to leave medicines as is and trial a continuous glucose monitor -the professional [employer].  She had been previously told that fasting hyperglycemia could be reactive to nocturnal hypoglycemia so this might be helpful to elucidate that phenomenon.  She will consider a personal CGM.  The only additional medicine I could think to add would be to Actos but will consider that in 3 months when she returns for her A1c.  PLAN : cont meds CGM RTC 3 months Change metformin to extended release 2000 QD if her insurance covers and there is a Scientist, research (life sciences) available

## 2018-09-08 NOTE — Patient Instructions (Addendum)
1. I will send in prescriptions for Metformin XR and Toprol later today. 2. We placed a continuous glucose monitor today. Please return in 7 days with Dr Maudie Mercury for [facility] #1.  3. See me in 3 months.   Please record the time, amount and what food drinks and activities you have while wearing the continuous glucose monitor(CGM) in the folder provided.  Bring the folder with you to follow up appointments  Do not have a CT or an MRI while wearing the CGM.   Please make an appointment for 1 week with me and a doctor for the first of two CGM downloads..   You will also return in 2 weeks to have your second download and the CGM removed.

## 2018-09-08 NOTE — Telephone Encounter (Signed)
-----   Message from Bartholomew Crews, MD sent at 09/08/2018  4:00 PM EDT ----- Holley Raring Would you pls request vaccine records [address] in [place]?Thanks

## 2018-09-09 ENCOUNTER — Encounter: Payer: Self-pay | Admitting: Internal Medicine

## 2018-09-09 NOTE — Assessment & Plan Note (Signed)
This problem is chronic and stable.  She remains on atorvastatin 20 mg a moderate intensity statin for primary prevention.  I have calculated her 10-year cardiovascular risk at her last appointment which is 7.26.  She is right at the cutoff for a high intensity statin recommendation.  PLAN : Consider changing to high intensity at a future appointment

## 2018-09-09 NOTE — Assessment & Plan Note (Signed)
This problem is chronic and stable.  She remains on omeprazole 20 without any side effects.  Occasionally, she might miss a dose and does fairly okay but prefers to remain on it to prevent symptoms.  PLAN:  Cont current meds

## 2018-09-09 NOTE — Assessment & Plan Note (Signed)
This problem is chronic and controlled.  Her stress level has recently increased due to the stay at home orders, illness in her sister, and trying to make sure her father does not get infected.  During the evenings sometimes she is exhausted and will get 10 to 11 hours of sleep at night.  Her energy level is okay during the day except an afternoon slump.  We have talked via [place] but shortly thereafter, things started to settle down and she did not feel the need to progress to pharmaceuticals or counseling.  She has been doing counseling but after a couple of wonderful sessions, the counselor she worked with left and she would have had to start the whole intake process over again.  We discussed that our clinic has [place] and also [facility] for counseling without a co-pay.  She will let me know if she wants referral to counseling.  She remains on Xanax 0.5 as needed.  30 pills usually last about 3 months but she has had a slight increase in her usage over these difficult times.  PLAN:  Cont current meds

## 2018-09-09 NOTE — Assessment & Plan Note (Signed)
This problem is chronic and stable.  She takes Mobic 20 mg on most days at bedtime.  Most of her pain is in her knees hips and shoulder.  There is very little elbow pain.  There is less than 30 minutes of morning stiffness.  She has no hand pain or stiffness.  PLAN:  Cont current meds Follow renal function yearly

## 2018-09-09 NOTE — Assessment & Plan Note (Signed)
This problem is chronic and controlled.  She remains on olmesartan 20, HCTZ 25, metoprolol 50 twice daily.  We discussed switching from metoprolol tartrate to metoprolol succinate for once a day dosing and she agreed.  PLAN : Change to metoprolol succinate 100 daily Continue other medicines

## 2018-09-09 NOTE — Assessment & Plan Note (Signed)
This problem is chronic and stable.  She takes oral B12 supplementation and at some point, we will need to check a B12 level to ensure adequate supplementation.  There are no prior vitamin B-12 levels in epic.  PLAN:  Cont current meds

## 2018-09-09 NOTE — Assessment & Plan Note (Signed)
The injection site skin changes are a little better, the lesions are gone, but it remains a little itchy.  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  As it is causing her issues, we can observe it and only intervene if it causes issues.  She gets her vaccinations at [facility].  She states that she would have gotten a Pneumovax at [facility] on [address] at [employer].  [facility] called but she is only gotten an influenza vaccine there.  We discussed pneumonia vaccination at her next appointment.

## 2018-09-13 ENCOUNTER — Telehealth: Payer: Self-pay | Admitting: *Deleted

## 2018-09-13 NOTE — Telephone Encounter (Signed)
Information was sent to CoverMyMeds for PA for Metformin HCL ER 1000 mg.  Awaiting determination from [place].  Sander Nephew, RN 09/12/2018 2:58 PM.

## 2018-09-14 ENCOUNTER — Other Ambulatory Visit: Payer: Self-pay | Admitting: Internal Medicine

## 2018-09-14 MED ORDER — CANAGLIFLOZIN-METFORMIN HCL ER 150-1000 MG PO TB24: 2.0000 | ORAL_TABLET | Freq: Every day | ORAL | 5 refills | Status: DC

## 2018-09-15 ENCOUNTER — Other Ambulatory Visit: Payer: Self-pay

## 2018-09-15 ENCOUNTER — Encounter: Payer: Self-pay | Admitting: Internal Medicine

## 2018-09-15 ENCOUNTER — Ambulatory Visit: Payer: No Typology Code available for payment source | Admitting: Pharmacist

## 2018-09-15 DIAGNOSIS — E1142 Type 2 diabetes mellitus with diabetic polyneuropathy: Secondary | ICD-10-CM

## 2018-09-15 NOTE — Progress Notes (Signed)
Diabetes Management Follow Up Kelly Gallagher is a 48 y.o. female who reported to clinic for DM CGM management. Identity was verified using date of birth and address.  Diabetes medications: canagliflozin 300 mg daily, metformin 1000 mg BID, insulin glargine (Lantus 50 units BID), glimepiride 4 mg daily, and dulaglutide 1.5 mg weekly. Patient correctly reports DM regimen and adherence  Freestyle Libre Results Downloaded and Reviewed  Freestyle Libre sensor placed 09/08/2018  Week #1 of monitoring, sensor fell off after 3 days.  3-day BG average 136, within target range 90% of the time  She had 2 episodes of hypoglycemia, one at 12am and one at 12pm. Patient recalls the hypoglycemia incident at 12pm on 09/09/2018, states she discussed with PCP possibility of switching from glimepiride to pioglitazone---will follow up with PCP.  She also had 2 episodes of BG in the 200s, but none > 300s. Patient attributes this to dietary changes while being on vacation.   A/P Blood glucose control: good, will follow up with PCP regarding possible switch from glimepiride to pioglitazone, as well as possibly switching from dulaglutide to semaglutide for potential increased efficacy for weight lowering. I also discussed with patient the option to combine canagliflozin with metformin.  A new CGM sensor was placed today and replacement sensor was ordered.  Documentation for Freestyle Libre Pro Continuous glucose monitoring Freestyle Libre Pro CGM sensor placed today. Patient was educated about wearing sensor, keeping food, activity and medication log and when to call office. Patient was educated about how to care for the sensor and not to have an MRI, CT or Diathermy while wearing the sensor. Follow up was arranged with the patient for 1 week.   Lot #: [id number] Serial #: [id number] Expiration Date: 01/21/2019  Flossie Dibble, PharmD 09/15/2018 9:39 AM.  Durward Fortes to contact clinic or seek medical attention if  concerns or symptoms arise. Will notify PCP of findings  The patient verbalized understanding of information provided by repeating back concepts discussed.  Follow-up 09/22/2018  Flossie Dibble

## 2018-09-15 NOTE — Addendum Note (Signed)
Addended by: Forde Dandy on: 09/15/2018 09:52 AM   Modules accepted: Orders

## 2018-09-16 MED FILL — OLMESARTAN MEDOXOMIL 20 MG: 20 | 30 days supply | Qty: 30 | Fill #3

## 2018-09-19 ENCOUNTER — Other Ambulatory Visit: Payer: Self-pay | Admitting: Internal Medicine

## 2018-09-19 DIAGNOSIS — E1142 Type 2 diabetes mellitus with diabetic polyneuropathy: Secondary | ICD-10-CM

## 2018-09-19 MED ORDER — CANAGLIFLOZIN 300 MG PO TABS: 300.0000 mg | ORAL_TABLET | Freq: Every day | ORAL | 3 refills | Status: DC

## 2018-09-19 MED ORDER — METFORMIN HCL 1000 MG PO TABS: 1000.0000 mg | ORAL_TABLET | Freq: Two times a day (BID) | ORAL | 3 refills | Status: DC

## 2018-09-19 NOTE — Progress Notes (Signed)
Hi Dr Lynnae January,  I will just stay on the regular metformin 1000 mg twice a day. Thank you for all your work trying to get this taken care of.     I appreciate you,

## 2018-09-20 MED ORDER — FREESTYLE LIBRE 14 DAY SENSOR MISC: 1.0000 "application " | 11 refills | Status: DC

## 2018-09-20 MED ORDER — FREESTYLE LIBRE 14 DAY READER DEVI: 1.0000 | Freq: Once | 0 refills | Status: AC

## 2018-09-20 NOTE — Addendum Note (Signed)
Addended by: Forde Dandy on: 09/20/2018 04:49 PM   Modules accepted: Orders

## 2018-09-21 ENCOUNTER — Telehealth: Payer: Self-pay | Admitting: *Deleted

## 2018-09-21 NOTE — Telephone Encounter (Addendum)
Information was called to [place] for PA for Freestyle Libre 14 day Reader.  Awaiting decision within 24-72 hours.  Sander Nephew, RN 09/21/2018 10:41 AM.  Joylene Igo from [employer] 14 Day Reader approved for 1 per year.  10/28/2018 thru 8/6/200.  FrrStyle Libre 14 Day Sensor approved 12 refills per year. 2 per 28 days.  An additional authorization was approved FreeStyle Libre 10 day sensors Reference # [id number] and is effective for 12 fills from 10/28/2018 to 10/27/2019 and limited to 3 sensors per 30 days from 10/28/2018 to 10/27/2018.  The healthcare provider must submit an additional PA request upon expiration of this approval.  Sander Nephew, RN 11/01/2018 9:19 AM.

## 2018-09-22 ENCOUNTER — Ambulatory Visit: Payer: No Typology Code available for payment source | Admitting: Pharmacist

## 2018-09-22 ENCOUNTER — Ambulatory Visit: Payer: No Typology Code available for payment source | Admitting: Internal Medicine

## 2018-09-22 ENCOUNTER — Other Ambulatory Visit: Payer: Self-pay

## 2018-09-22 DIAGNOSIS — E1142 Type 2 diabetes mellitus with diabetic polyneuropathy: Secondary | ICD-10-CM

## 2018-09-22 MED ORDER — XULTOPHY 100-3.6 UNIT-MG/ML ~~LOC~~ SOPN: 50.0000 [IU] | PEN_INJECTOR | Freq: Every day | SUBCUTANEOUS | 11 refills | Status: DC

## 2018-09-22 MED ORDER — GLIMEPIRIDE 4 MG PO TABS: 4.0000 mg | ORAL_TABLET | Freq: Two times a day (BID) | ORAL | 3 refills | Status: DC

## 2018-09-22 MED ORDER — LANTUS SOLOSTAR 100 UNIT/ML ~~LOC~~ SOPN: 50.0000 [IU] | PEN_INJECTOR | Freq: Every evening | SUBCUTANEOUS | 3 refills | Status: DC

## 2018-09-22 MED FILL — XULTOPHY 100 UNIT-3.6MG/ML: 100-3.6 | 30 days supply | Qty: 15 | Fill #0

## 2018-09-22 MED FILL — LANTUS SOLOSTAR 100 UNITS/M: 100 | 30 days supply | Qty: 15 | Fill #0

## 2018-09-22 NOTE — Progress Notes (Addendum)
S: Kelly Gallagher is a 48 y.o. female reports to clinical pharmacist appointment for CGM DM management.  Allergies  Allergen Reactions  . Ace Inhibitors Anaphylaxis  . Effexor Xr [Venlafaxine Hcl]     Patient states caused symptomatic elevated serotonin levels    Current Outpatient Medications:  .  Albuterol Sulfate 108 (90 Base) MCG/ACT AEPB, Inhale 2 puffs into the lungs every 4 (four) hours as needed., Disp: 1 each, Rfl: 4 .  ALPRAZolam (XANAX) 0.5 MG tablet, Take by mouth., Disp: , Rfl:  .  atorvastatin (LIPITOR) 20 MG tablet, Take 1 tablet (20 mg total) by mouth daily., Disp: 90 tablet, Rfl: 3 .  betamethasone dipropionate (DIPROLENE) 0.05 % cream, Apply topically 2 (two) times daily., Disp: 30 g, Rfl: 0 .  canagliflozin (INVOKANA) 300 MG TABS tablet, Take 1 tablet (300 mg total) by mouth daily before breakfast., Disp: 90 tablet, Rfl: 3 .  Continuous Blood Gluc Sensor (FREESTYLE LIBRE 14 DAY SENSOR) MISC, 1 application by Does not apply route every 14 (fourteen) days., Disp: 2 each, Rfl: 11 .  fluticasone (FLONASE) 50 MCG/ACT nasal spray, Place 2 sprays into both nostrils daily for 10 days., Disp: 16 g, Rfl: 0 .  glimepiride (AMARYL) 4 MG tablet, Take 1 tablet (4 mg total) by mouth 2 (two) times a day., Disp: 180 tablet, Rfl: 3 .  hydrochlorothiazide (HYDRODIURIL) 25 MG tablet, Take 1 tablet (25 mg total) by mouth daily., Disp: 90 tablet, Rfl: 3 .  Insulin Degludec-Liraglutide (XULTOPHY) 100-3.6 UNIT-MG/ML SOPN, Inject 50 Units into the skin daily., Disp: 5 pen, Rfl: 11 .  Insulin Glargine (LANTUS SOLOSTAR) 100 UNIT/ML Solostar Pen, Inject 50 Units into the skin every evening., Disp: 5 pen, Rfl: 3 .  meloxicam (MOBIC) 15 MG tablet, Take 1 tablet (15 mg total) by mouth daily as needed for pain., Disp: 90 tablet, Rfl: 1 .  metFORMIN (GLUCOPHAGE) 1000 MG tablet, Take 1 tablet (1,000 mg total) by mouth 2 (two) times daily with a meal., Disp: 180 tablet, Rfl: 3 .  metoprolol succinate (TOPROL  XL) 100 MG 24 hr tablet, Take 1 tablet (100 mg total) by mouth daily. Take with or immediately following a meal., Disp: 90 tablet, Rfl: 3 .  Multiple Vitamin (MULTIVITAMIN) tablet, Take 1 tablet by mouth daily., Disp: , Rfl:  .  olmesartan (BENICAR) 20 MG tablet, Take 1 tablet (20 mg total) by mouth daily., Disp: 90 tablet, Rfl: 3 .  Omega-3 Fatty Acids (FISH OIL) 1000 MG CAPS, Take by mouth., Disp: , Rfl:  .  omeprazole (PRILOSEC) 20 MG capsule, Take 1 capsule (20 mg total) by mouth daily., Disp: 90 capsule, Rfl: 3 .  Probiotic Product (PROBIOTIC-10 PO), Take by mouth., Disp: , Rfl:  .  vitamin B-12 (CYANOCOBALAMIN) 1000 MCG tablet, Take by mouth., Disp: , Rfl:  No past medical history on file. Social History   Socioeconomic History  . Marital status: Married    Spouse name: Not on file  . Number of children: Not on file  . Years of education: Not on file  . Highest education level: Not on file  Occupational History  . Not on file  Social Needs  . Financial resource strain: Not very hard  . Food insecurity    Worry: Never true    Inability: Never true  . Transportation needs    Medical: No    Non-medical: No  Tobacco Use  . Smoking status: Never Smoker  . Smokeless tobacco: Never Used  . Tobacco  comment: lives with smokers   Substance and Sexual Activity  . Alcohol use: Not Currently    Comment: Rarely.  . Drug use: Not on file  . Sexual activity: Not on file  Lifestyle  . Physical activity    Days per week: Not on file    Minutes per session: Not on file  . Stress: Not on file  Relationships  . Social Musicianconnections    Talks on phone: Not on file    Gets together: Not on file    Attends religious service: Not on file    Active member of club or organization: Not on file    Attends meetings of clubs or organizations: Not on file    Relationship status: Not on file  Other Topics Concern  . Not on file  Social History Narrative  . Not on file   Family History   Problem Relation Age of Onset  . Cancer Father        Head and neck cancer. Has PEG  . Lupus Sister   . Breast cancer Other   . Breast cancer Other    O:    Component Value Date/Time   HGBA1C 7.5 (A) 09/08/2018 0856   Ht Readings from Last 2 Encounters:  09/08/18 5\' 5"  (1.651 m)  08/24/18 5\' 5"  (1.651 m)   Wt Readings from Last 2 Encounters:  09/08/18 247 lb 1.6 oz (112.1 kg)  08/24/18 246 lb (111.6 kg)   There is no height or weight on file to calculate BMI. BP Readings from Last 3 Encounters:  09/08/18 105/65  08/24/18 112/70  05/06/18 110/75   A/P: Freestyle Libre Results Downloaded and Reviewed  7-day BG average 149 (correlates with A1C of 7%), within target range 83% of the time  Above 180 mg/dL 16%17% of the time, highest around meals  Patient reports no symptoms of concern today. We reviewed hypoglycemia management. No major medication changes are recommended at this time. We discussed possibly switching from Trulicity to [facility] for more efficacy. Patient reports favorable response to Victoza in the past which has similar efficacy to Ozempic. Considered Xultophy which contains Victoza with basal insulin. This would also help with regimen simplification. Price check for Xultophy was  $14 copay. Discussed with patient and she agrees on this change.  Summary (contacted pharmacy to notify them of these changes) Stop Trulicity (dulaglutide) Start Xultophy (insulin glargine + liraglutide, which is Victoza) 50 units every morning Change Lantus (insulin glargine) from twice daily to once daily (now 50 units every evening) Continue Amaryl (glimepiride) 4 mg twice daily Continue Invokana (canagliflozin) 300 mg once daily  An after visit summary was provided and patient advised to follow up if any changes in condition or questions regarding medications arise.   The patient verbalized understanding of information provided by repeating back concepts discussed.

## 2018-09-28 MED FILL — GLIMEPIRIDE 4 MG TABS: 4 | 90 days supply | Qty: 180 | Fill #0

## 2018-09-29 ENCOUNTER — Other Ambulatory Visit: Payer: Self-pay

## 2018-09-29 ENCOUNTER — Ambulatory Visit: Payer: No Typology Code available for payment source | Admitting: Pharmacist

## 2018-09-29 DIAGNOSIS — E1142 Type 2 diabetes mellitus with diabetic polyneuropathy: Secondary | ICD-10-CM

## 2018-09-29 DIAGNOSIS — Z794 Long term (current) use of insulin: Secondary | ICD-10-CM

## 2018-09-30 NOTE — Progress Notes (Addendum)
S: Kelly Gallagher is a 48 y.o. female reports to clinical pharmacist appointment for CGM follow up.  Allergies  Allergen Reactions  . Ace Inhibitors Anaphylaxis  . Effexor Xr [Venlafaxine Hcl]     Patient states caused symptomatic elevated serotonin levels    Current Outpatient Medications:  .  Albuterol Sulfate 108 (90 Base) MCG/ACT AEPB, Inhale 2 puffs into the lungs every 4 (four) hours as needed., Disp: 1 each, Rfl: 4 .  ALPRAZolam (XANAX) 0.5 MG tablet, Take by mouth., Disp: , Rfl:  .  atorvastatin (LIPITOR) 20 MG tablet, Take 1 tablet (20 mg total) by mouth daily., Disp: 90 tablet, Rfl: 3 .  betamethasone dipropionate (DIPROLENE) 0.05 % cream, Apply topically 2 (two) times daily., Disp: 30 g, Rfl: 0 .  canagliflozin (INVOKANA) 300 MG TABS tablet, Take 1 tablet (300 mg total) by mouth daily before breakfast., Disp: 90 tablet, Rfl: 3 .  Continuous Blood Gluc Sensor (FREESTYLE LIBRE 14 DAY SENSOR) MISC, 1 application by Does not apply route every 14 (fourteen) days., Disp: 2 each, Rfl: 11 .  fluticasone (FLONASE) 50 MCG/ACT nasal spray, Place 2 sprays into both nostrils daily for 10 days., Disp: 16 g, Rfl: 0 .  glimepiride (AMARYL) 4 MG tablet, Take 1 tablet (4 mg total) by mouth 2 (two) times a day., Disp: 180 tablet, Rfl: 3 .  hydrochlorothiazide (HYDRODIURIL) 25 MG tablet, Take 1 tablet (25 mg total) by mouth daily., Disp: 90 tablet, Rfl: 3 .  Insulin Degludec-Liraglutide (XULTOPHY) 100-3.6 UNIT-MG/ML SOPN, Inject 50 Units into the skin daily., Disp: 5 pen, Rfl: 11 .  Insulin Glargine (LANTUS SOLOSTAR) 100 UNIT/ML Solostar Pen, Inject 50 Units into the skin every evening., Disp: 5 pen, Rfl: 3 .  meloxicam (MOBIC) 15 MG tablet, Take 1 tablet (15 mg total) by mouth daily as needed for pain., Disp: 90 tablet, Rfl: 1 .  metFORMIN (GLUCOPHAGE) 1000 MG tablet, Take 1 tablet (1,000 mg total) by mouth 2 (two) times daily with a meal., Disp: 180 tablet, Rfl: 3 .  metoprolol succinate (TOPROL XL)  100 MG 24 hr tablet, Take 1 tablet (100 mg total) by mouth daily. Take with or immediately following a meal., Disp: 90 tablet, Rfl: 3 .  Multiple Vitamin (MULTIVITAMIN) tablet, Take 1 tablet by mouth daily., Disp: , Rfl:  .  olmesartan (BENICAR) 20 MG tablet, Take 1 tablet (20 mg total) by mouth daily., Disp: 90 tablet, Rfl: 3 .  Omega-3 Fatty Acids (FISH OIL) 1000 MG CAPS, Take by mouth., Disp: , Rfl:  .  omeprazole (PRILOSEC) 20 MG capsule, Take 1 capsule (20 mg total) by mouth daily., Disp: 90 capsule, Rfl: 3 .  Probiotic Product (PROBIOTIC-10 PO), Take by mouth., Disp: , Rfl:  .  vitamin B-12 (CYANOCOBALAMIN) 1000 MCG tablet, Take by mouth., Disp: , Rfl:  No past medical history on file. Social History   Socioeconomic History  . Marital status: Married    Spouse name: Not on file  . Number of children: Not on file  . Years of education: Not on file  . Highest education level: Not on file  Occupational History  . Not on file  Social Needs  . Financial resource strain: Not very hard  . Food insecurity    Worry: Never true    Inability: Never true  . Transportation needs    Medical: No    Non-medical: No  Tobacco Use  . Smoking status: Never Smoker  . Smokeless tobacco: Never Used  . Tobacco  comment: lives with smokers   Substance and Sexual Activity  . Alcohol use: Not Currently    Comment: Rarely.  . Drug use: Not on file  . Sexual activity: Not on file  Lifestyle  . Physical activity    Days per week: Not on file    Minutes per session: Not on file  . Stress: Not on file  Relationships  . Social Herbalist on phone: Not on file    Gets together: Not on file    Attends religious service: Not on file    Active member of club or organization: Not on file    Attends meetings of clubs or organizations: Not on file    Relationship status: Not on file  Other Topics Concern  . Not on file  Social History Narrative  . Not on file   Family History  Problem  Relation Age of Onset  . Cancer Father        Head and neck cancer. Has PEG  . Lupus Sister   . Breast cancer Other   . Breast cancer Other    O:    Component Value Date/Time   HGBA1C 7.5 (A) 09/08/2018 0856   Ht Readings from Last 2 Encounters:  09/08/18 5\' 5"  (1.651 m)  08/24/18 5\' 5"  (1.651 m)   Wt Readings from Last 2 Encounters:  09/08/18 247 lb 1.6 oz (112.1 kg)  08/24/18 246 lb (111.6 kg)   There is no height or weight on file to calculate BMI. BP Readings from Last 3 Encounters:  09/08/18 105/65  08/24/18 112/70  05/06/18 110/75   A/P: Freestyle Libre Results Downloaded and Reviewed  Freestyle Libre sensor placed  Week #2 of monitoring  14-day BG average 154, within target range 82% of the time  Above 180 mg/dL 18% of the time, highest around evening  Below 70 mg/dL 0% of the time, lowest around afternoon  Patient states she has a few more doses of dulaglutide before switching to Xultophy (insulin degludec-liraglutide). Anticipate improved BG-lowering efficacy with this switch.  Of note, patient states she ran out of glimepiride for a few days. Average BG on those days was 153. Would consider discontinuing glimepiride due to the wearing off effect seen with sulfonylureas after 10+ years of therapy (patient reports taking for 10+ years). Will discuss with PCP but will not change until patient is able to obtain personal CGM and has been stable on the Xultophy. Patient can possibly discontinue or decrease glimepiride dose.  An after visit summary was provided and patient advised to follow up if any changes in condition or questions regarding medications arise. Plan to follow up with patient in 2 weeks remotely for further support once she obtains personal CGM.   The patient verbalized understanding of information provided by repeating back concepts discussed.

## 2018-10-12 ENCOUNTER — Ambulatory Visit
Admission: RE | Admit: 2018-10-12 | Discharge: 2018-10-12 | Disposition: A | Discharge status: Home / Self Care | Payer: No Typology Code available for payment source | Source: Ambulatory Visit | Attending: Obstetrics & Gynecology | Admitting: Obstetrics & Gynecology

## 2018-10-12 ENCOUNTER — Other Ambulatory Visit: Payer: Self-pay

## 2018-10-12 DIAGNOSIS — Z01419 Encounter for gynecological examination (general) (routine) without abnormal findings: Secondary | ICD-10-CM

## 2018-10-16 MED FILL — OMEPRAZOLE 20 MG CPDR: 20 | 90 days supply | Qty: 90 | Fill #2

## 2018-10-16 MED FILL — ATORVASTATIN 20 MG TABLET: 20 | 90 days supply | Qty: 90 | Fill #2

## 2018-10-16 MED FILL — HYDROCHLOROTHIAZIDE 25 MG T: 25 | 90 days supply | Qty: 90 | Fill #2

## 2018-10-16 MED FILL — OLMESARTAN MEDOXOMIL 20 MG: 20 | 30 days supply | Qty: 30 | Fill #4

## 2018-10-17 ENCOUNTER — Other Ambulatory Visit: Payer: Self-pay | Admitting: Internal Medicine

## 2018-10-17 MED FILL — XULTOPHY 100 UNIT-3.6MG/ML: 100-3.6 | 30 days supply | Qty: 15 | Fill #1

## 2018-10-17 MED FILL — LANTUS SOLOSTAR 100 UNITS/M: 100 | 30 days supply | Qty: 15 | Fill #1

## 2018-10-17 MED FILL — METOPROLOL TARTRATE 50 MG T: 50 | 90 days supply | Qty: 180 | Fill #2

## 2018-10-17 MED FILL — MELOXICAM 15 MG TABLET: 15 | 90 days supply | Qty: 90 | Fill #0

## 2018-10-27 ENCOUNTER — Ambulatory Visit: Payer: No Typology Code available for payment source | Admitting: Internal Medicine

## 2018-10-28 ENCOUNTER — Telehealth: Payer: Self-pay | Admitting: Pharmacist

## 2018-10-28 NOTE — Progress Notes (Signed)
Contacted patient for DM medication management follow up. Pt reports ave BG 150s, recent BG elevations due to stress and abscessed tooth. Patient reports no symptoms of concern today, no hypoglycemia, but she states she sometimes has symptoms of hypoglycemia.   Overall, reports doing well but awaiting PA approval of personal Freestyle Libre and further DM med management at follow up (if possible, d/c glimepiride due to diminished efficacy, can titrate insulin if needed).  No changes made today while awaiting Freestyle Libre. Will also plan to combine Invokana and Metformin when patient is due for next refill. Will follow up with patient in 2 weeks.

## 2018-11-01 ENCOUNTER — Other Ambulatory Visit: Payer: Self-pay | Admitting: Dietician

## 2018-11-01 ENCOUNTER — Telehealth: Payer: Self-pay | Admitting: *Deleted

## 2018-11-01 DIAGNOSIS — E1142 Type 2 diabetes mellitus with diabetic polyneuropathy: Secondary | ICD-10-CM

## 2018-11-01 MED ORDER — FREESTYLE LIBRE 2 SENSOR SYSTM MISC: 1.0000 | Freq: Four times a day (QID) | 11 refills | Status: DC

## 2018-11-01 MED ORDER — FREESTYLE LIBRE 2 READER SYSTM DEVI: 1.0000 | Freq: Four times a day (QID) | 0 refills | Status: DC

## 2018-11-01 NOTE — Telephone Encounter (Addendum)
Call to [place] [facility] for PA for Free Style Libre 2 .  Information was given to representative.  Awaiting decision within 24-48 hours.  Sander Nephew, RN 11/01/2018 4:21 PM.  Fax from [place] 2 Reader approved for 1 11/01/2018 to 10/31/2019.  Free [facility] [employer] 2 Sensors per 28 days for 12 fills.  Effective 11/01/2018 thru 10/31/2019.  Sander Nephew, RN 11/04/2018 9:10 AM.

## 2018-11-01 NOTE — Telephone Encounter (Signed)
Kelly Gallagher pharmacy calls and says they can order the [employer] 2, but her insurance is asking for a PA.

## 2018-11-01 NOTE — Telephone Encounter (Signed)
The PA was approved for the Freestyle Libre CGM reader and sensors. I called [facility] and they will need a new prescription to see if this will cover the Freestyle Libre 2 CGM that has alarms and alerts and is more accurate at lower values. Suggest having patient schedule an appointment and bring CGM reader and sensors in for training once she receives it.

## 2018-11-07 ENCOUNTER — Other Ambulatory Visit: Payer: Self-pay | Admitting: Dietician

## 2018-11-07 DIAGNOSIS — E1142 Type 2 diabetes mellitus with diabetic polyneuropathy: Secondary | ICD-10-CM

## 2018-11-07 MED FILL — FREESTYLE LIBRE 2 READER SY: 1 days supply | Qty: 1 | Fill #0

## 2018-11-07 MED FILL — FREESTYLE LIBRE 2 SENSOR SY: 28 days supply | Qty: 2 | Fill #0

## 2018-11-07 NOTE — Telephone Encounter (Signed)
Notified Ms. Liao that her Elenor Legato 2 CGM was approved and should be ready for pick up. She asked questions and decided because the reader of the [facility] 2 is more expensive that she'll use the Librelink app on her smartphone. She also set up an appointment for diabetes education and training for 11/21/18. request referral

## 2018-11-07 NOTE — Progress Notes (Signed)
Request referral for diabetes self management education and support per patient request.

## 2018-11-15 MED FILL — LANTUS SOLOSTAR 100 UNITS/M: 100 | 30 days supply | Qty: 15 | Fill #2

## 2018-11-15 MED FILL — OLMESARTAN MEDOXOMIL 20 MG: 20 | 30 days supply | Qty: 30 | Fill #5

## 2018-11-15 MED FILL — XULTOPHY 100 UNIT-3.6MG/ML: 100-3.6 | 30 days supply | Qty: 15 | Fill #2

## 2018-11-17 LAB — HM DIABETES EYE EXAM

## 2018-11-21 ENCOUNTER — Ambulatory Visit: Payer: No Typology Code available for payment source | Admitting: Dietician

## 2018-11-21 ENCOUNTER — Encounter: Payer: Self-pay | Admitting: *Deleted

## 2018-11-29 MED FILL — INVOKANA 300 MG TABLET: 300 | 90 days supply | Qty: 90 | Fill #2

## 2018-12-01 ENCOUNTER — Telehealth: Payer: Self-pay | Admitting: Pharmacist

## 2018-12-01 DIAGNOSIS — E1142 Type 2 diabetes mellitus with diabetic polyneuropathy: Secondary | ICD-10-CM

## 2018-12-01 DIAGNOSIS — Z794 Long term (current) use of insulin: Secondary | ICD-10-CM

## 2018-12-01 MED ORDER — INVOKAMET XR 150-1000 MG PO TB24: 1.0000 | ORAL_TABLET | Freq: Two times a day (BID) | ORAL | 3 refills | Status: DC

## 2018-12-01 MED FILL — UNIFINE PENTIPS 32GX5/32: 32G X 4 MM | 90 days supply | Qty: 300 | Fill #1

## 2018-12-01 MED FILL — INVOKAMET XR 150-1,000 MG T: 150-1000 | 90 days supply | Qty: 180 | Fill #0

## 2018-12-01 NOTE — Progress Notes (Addendum)
Spoke to patient for DM med management follow up. Recent changes to regimen include medication simplification to combination insulin/GLP-1a, and SGLT-2i/metformin. Glimepiride taper per PCP approval due to glycemic variability and polypharmacy per ADA guidelines. Patient is also on metoprolol which may mask symptoms of hypoglycemia.   Patient reports she will reduce glimepiride dose by 50% tomorrow and monitor closely using CGM with plan to eventually d/c glimepiride as tolerated (can titrate insulin or consider other agents such as pioglitazone if further medication is needed in the future).  Patient states she will be meeting with Debera Lat, CDE and dietician tomorrow to review BG and dietary recommendations as well to help minimize pharmacotherapy.  Advised patient to contact me if any questions arise, will continue to follow along in her care and provide support as needed. Patient verbalized understanding.

## 2018-12-02 ENCOUNTER — Ambulatory Visit: Payer: No Typology Code available for payment source | Admitting: Dietician

## 2018-12-02 ENCOUNTER — Other Ambulatory Visit: Payer: Self-pay

## 2018-12-02 NOTE — Telephone Encounter (Signed)
Wee might have talked about this - I cant remember. The # pills she has to take won't chenage but if she wants the combo, I have no objection. thanks!

## 2018-12-02 NOTE — Progress Notes (Signed)
Called patient to reschedule. Will see on same day she sees the doctor if not before. Debera Lat, RD 12/02/2018 9:35 AM.

## 2018-12-08 ENCOUNTER — Encounter: Payer: Self-pay | Admitting: Internal Medicine

## 2018-12-08 ENCOUNTER — Encounter: Payer: Self-pay | Admitting: Dietician

## 2018-12-08 ENCOUNTER — Ambulatory Visit (INDEPENDENT_AMBULATORY_CARE_PROVIDER_SITE_OTHER): Payer: No Typology Code available for payment source | Admitting: Dietician

## 2018-12-08 ENCOUNTER — Other Ambulatory Visit: Payer: Self-pay

## 2018-12-08 ENCOUNTER — Ambulatory Visit (INDEPENDENT_AMBULATORY_CARE_PROVIDER_SITE_OTHER): Payer: No Typology Code available for payment source | Admitting: Internal Medicine

## 2018-12-08 VITALS — BP 116/65 | HR 68 | Temp 98.7°F | Wt 250.0 lb

## 2018-12-08 DIAGNOSIS — Z794 Long term (current) use of insulin: Secondary | ICD-10-CM | POA: Diagnosis not present

## 2018-12-08 DIAGNOSIS — E1142 Type 2 diabetes mellitus with diabetic polyneuropathy: Secondary | ICD-10-CM

## 2018-12-08 DIAGNOSIS — Z6841 Body Mass Index (BMI) 40.0 and over, adult: Secondary | ICD-10-CM | POA: Diagnosis not present

## 2018-12-08 DIAGNOSIS — E1149 Type 2 diabetes mellitus with other diabetic neurological complication: Secondary | ICD-10-CM | POA: Diagnosis not present

## 2018-12-08 DIAGNOSIS — Z713 Dietary counseling and surveillance: Secondary | ICD-10-CM | POA: Diagnosis not present

## 2018-12-08 DIAGNOSIS — R252 Cramp and spasm: Secondary | ICD-10-CM

## 2018-12-08 DIAGNOSIS — Z23 Encounter for immunization: Secondary | ICD-10-CM | POA: Diagnosis not present

## 2018-12-08 DIAGNOSIS — I10 Essential (primary) hypertension: Secondary | ICD-10-CM

## 2018-12-08 DIAGNOSIS — Z0000 Encounter for general adult medical examination without abnormal findings: Secondary | ICD-10-CM

## 2018-12-08 DIAGNOSIS — Z79899 Other long term (current) drug therapy: Secondary | ICD-10-CM

## 2018-12-08 LAB — POCT GLYCOSYLATED HEMOGLOBIN (HGB A1C): Hemoglobin A1C: 7.9 % — AB (ref 4.0–5.6)

## 2018-12-08 LAB — GLUCOSE, CAPILLARY: Glucose-Capillary: 162 mg/dL — ABNORMAL HIGH (ref 70–99)

## 2018-12-08 NOTE — Assessment & Plan Note (Addendum)
This problem is chronic and stable.  She is on metformin 1000 twice daily for exercise, Lantus 50 QAM, Xultophy (lantus + lraglutide) 50 QPM, and canagliflozin 300 QD, glimepiride 2 mg BID which is being titrated down to off.  She now has a Optometrist and loves it.  Her 7-day average showed 80% and target and 20% high.  Her download showed that the highs are most frequently occurring about 9 AM ish.  There is also a lot of variability at this time.  She has no hypoglycemia and has her low alarm set 90 so that she can intervene if needed.  Our goal will be to decrease the hyperglycemia and variability around 9 AM and then shift her entire curve downwards to achieve better overall glycemic control.  She has started weight watchers and is also trying to walk.  Her new house does not have a fenced yard so she has to walk her dog which is increasing her exercise.  She is working with [name], our diabetes educator, to achieve these goals.  Her A1c today was 7.9, up from 7.5 3 months ago but I am confident this can be lowered.  PLAN:  Cont current meds except Titrate glimepiride to off Continue working with [name] Self enrolled in weight watchers Independently increased exercise regimen

## 2018-12-08 NOTE — Progress Notes (Signed)
   Subjective:    Patient ID: Kelly Gallagher, female    DOB: 01/16/1971, 48 y.o.   MRN: [id number]  HPI  Kelly Gallagher is here for DM F/U. Please see the A&P for the status of the pt's chronic medical problems.  ROS : per ROS section and in problem oriented charting. All other systems are negative.  PMHx, Soc hx, and / or Fam hx : Married, employed, sister and father live with her  Review of Systems  Gastrointestinal: Negative for abdominal pain and nausea.  Musculoskeletal:       + leg cramps  Neurological: Negative for dizziness and light-headedness.       Objective:   Physical Exam Constitutional:      General: She is not in acute distress.    Appearance: Normal appearance. She is not ill-appearing, toxic-appearing or diaphoretic.  HENT:     Head: Normocephalic and atraumatic.     Right Ear: External ear normal.     Left Ear: External ear normal.  Eyes:     Extraocular Movements: Extraocular movements intact.     Conjunctiva/sclera: Conjunctivae normal.  Neurological:     General: No focal deficit present.     Mental Status: She is alert. Mental status is at baseline.  Psychiatric:        Mood and Affect: Mood normal.        Behavior: Behavior normal.        Thought Content: Thought content normal.        Judgment: Judgment normal.           Assessment & Plan:

## 2018-12-08 NOTE — Patient Instructions (Addendum)
Hi Kelly Gallagher,   Good to meet with you!!!  I thought since you asked for an update about what has changed since 2007 as far as meal planning and I responded "the types of carbs, fats and protein recommended". I would try to explain what I meant by that.   It is no longer thought that a calorie is a calorie, all carbs fats or proteins are equal- some foods and the nutrients in them are thought to change what fatty acids are produced that then alters hormone expression inside of [place]... we know that diabetes is a disease where the hormones insulin, glucagon and GLP-1s are not working like they should.   Here is a summary of foods to try to add and foods to try to keep at a minimum:   Eat more... 1. Whole grains: whole wheat cereal, crackers, bread, pasta, old fashioned oats & brown rice, quinoa,bulgur,spelt, etc.. 2. Whole fruits-1-2 cups a day 3. Minimally processed Vegetables- 2-3 cups a day 4. Lowfat Protein: Chicken, [place], lean cuts of beef and pork, fish 2-3 times a week, tofu, tempeh, beans, go meatless at least one time per week 5. Nuts, Seeds and Beans- :add walnuts to cereal, peanut butter sandwich; beans instead of meat 6- Healthy Fats- nuts, seeds, chia seeds, flax seeds, avocados, olives, peanut butter,  almond butter...  Eat Less... 1. Saturated & Trans fats- limit sat fat to 15 grams per day, best for zero trans fats (found mostly in red meat, high fat dairy like milk, ice cream, coffee creamer, snack foods like chips, pork rind, fried foods) 2. Added Sugar: limit to 6 teaspoons added sugar per day 3. Sodium:Use spices instead of salt, avoid salty foods like soup, crackers, chips, lunch meat, sausage, hot dogs  Avoid Endocrine Disrupting chemicals as much as possible: cooking in plastics, use of plastics, non-stick pans, etc...   Happy to review your food records along with your CGM results or answer questions anytime.  [name] [phone]

## 2018-12-08 NOTE — Progress Notes (Signed)
Diabetes Self Management Training:  Appt start time: 0915 end time:  1015. Total time: 60 Visit # 1  12/08/2018 Kelly Gallagher [id number]  Assessment:  Primary concerns today: meal planning for weight loss and diabetes Kelly Gallagher is here for support of her efforts to use meal planning for weight loss and control of her diabetes. She asked for updates to her last information about diabetes meal planning that was the last time she saw a diabetes educator in 2007. She recently started Weight watchers which she has done in the past with successful weight loss. With this she has also started recording/self monitoring her food intake. She also participates in [facility] mostly using the medicine discounts it provides but getting feedback from the nurse who monitors the program. She wears a Garmin to monitor her activity and her average is about 5000 steps a day. She works full time, cares for family members and a dog, is married and plays music. She is working with a Social worker to learn to set healthy boundaries.   Preferred Learning Style: No preference indicated  Learning Readiness: Change in progress  ANTHROPOMETRICS:Estimated body mass index is 41.6 kg/m as calculated from the following:   Height as of 09/08/18: 5\' 5"  (1.651 m).   Weight as of an earlier encounter on 12/08/18: 250 lb (113.4 kg).  WEIGHT HISTORY: reports 85# weight loss  Wt Readings from Last 5 Encounters:  12/08/18 250 lb (113.4 kg)  09/08/18 247 lb 1.6 oz (112.1 kg)  08/24/18 246 lb (111.6 kg)  05/06/18 244 lb (110.7 kg)  04/28/18 245 lb (111.1 kg)    SLEEP:not addressed today  MEDICATIONS:  Current Outpatient Medications on File Prior to Visit  Medication Sig Dispense Refill  . Albuterol Sulfate 108 (90 Base) MCG/ACT AEPB Inhale 2 puffs into the lungs every 4 (four) hours as needed. 1 each 4  . ALPRAZolam (XANAX) 0.5 MG tablet Take by mouth.    Marland Kitchen atorvastatin (LIPITOR) 20 MG tablet Take 1 tablet (20 mg total) by mouth  daily. 90 tablet 3  . betamethasone dipropionate (DIPROLENE) 0.05 % cream Apply topically 2 (two) times daily. 30 g 0  . Canagliflozin-metFORMIN HCl ER (INVOKAMET XR) [phone] MG TB24 Take 1 tablet by mouth 2 (two) times daily with a meal. (Patient not taking: Reported on 12/08/2018) 180 tablet 3  . Continuous Blood Gluc Receiver (FREESTYLE LIBRE 2 READER SYSTM) DEVI 1 each by Does not apply route 4 (four) times daily. 1 each 0  . Continuous Blood Gluc Sensor (FREESTYLE LIBRE 2 SENSOR SYSTM) MISC 1 each by Does not apply route 4 (four) times daily. 2 each 11  . fluticasone (FLONASE) 50 MCG/ACT nasal spray Place 2 sprays into both nostrils daily for 10 days. 16 g 0  . glimepiride (AMARYL) 4 MG tablet Take 1 tablet (4 mg total) by mouth 2 (two) times a day. (Patient taking differently: Take 2 mg by mouth 2 (two) times a day. ) 180 tablet 3  . hydrochlorothiazide (HYDRODIURIL) 25 MG tablet Take 1 tablet (25 mg total) by mouth daily. 90 tablet 3  . Insulin Degludec-Liraglutide (XULTOPHY) 100-3.6 UNIT-MG/ML SOPN Inject 50 Units into the skin daily. 5 pen 11  . Insulin Glargine (LANTUS SOLOSTAR) 100 UNIT/ML Solostar Pen Inject 50 Units into the skin every evening. 5 pen 3  . meloxicam (MOBIC) 15 MG tablet TAKE 1 TABLET BY MOUTH ONCE A DAY AS NEEDED FOR PAIN 90 tablet 1  . metoprolol succinate (TOPROL XL) 100 MG 24 hr  tablet Take 1 tablet (100 mg total) by mouth daily. Take with or immediately following a meal. 90 tablet 3  . Multiple Vitamin (MULTIVITAMIN) tablet Take 1 tablet by mouth daily.    Marland Kitchen. olmesartan (BENICAR) 20 MG tablet Take 1 tablet (20 mg total) by mouth daily. 90 tablet 3  . Omega-3 Fatty Acids (FISH OIL) 1000 MG CAPS Take by mouth.    Marland Kitchen. omeprazole (PRILOSEC) 20 MG capsule Take 1 capsule (20 mg total) by mouth daily. 90 capsule 3  . Probiotic Product (PROBIOTIC-10 PO) Take by mouth.    . vitamin B-12 (CYANOCOBALAMIN) 1000 MCG tablet Take by mouth.     No current facility-administered  medications on file prior to visit.     BLOOD SUGAR: Lab Results  Component Value Date   HGBA1C 7.9 (A) 12/08/2018   HGBA1C 7.5 (A) 09/08/2018   HGBA1C 6.7 (A) 04/28/2018   CGM Results:  Average is  161  for 14 days, % time CGM is active is 95% Glucose management indicator 7.2 % Time in range (70-180 mg/dL): 76 % (Goal >16%>70%) Time High (181-250 mg/dL) 23 % (Goal < 10%25%) Time Very High (>250 mg/dl) 1 % (Goal < 5%) Time Low (54-69 mg/dL): 0 % (Goal is <9%<4%) Time Very Low (<54) 0%  (Goal <1%)  Coefficient of variation:18.1% (Goal is <36%)  DIETARY INTAKE: Usual eating pattern, everyday foods and avoided foods to be covered at future visit as appropriate.  Dining Out (times/week): reports seldom, mostly takes her food with her to work  Usual physical activity: walks dog, gets ~ 5000 steps a day  Estimated energy needs:2000- 2500 calories, so less should provide weight loss carbs suggested was a range from 40-50 for 3 meals and If she snacks 10-20 grams for a total of 120 grams to 210 grams per day mif[address]in st jeor:  Daily calorie needs based on activity level Activity Level    Calorie Sedentary: little or no exercise 2,130 Exercise 1-3 times/week  2,440 Exercise 4-5 times/week  2,600 Daily exercise or intense exercise 3-4 times/week 2,751 Intense exercise 6-7 times/week 3,062 Very intense exercise daily, or physical job 3,372 Exercise: 15-30 minutes of elevated heart rate activity. Intense exercise: 45-120 minutes of elevated heart rate activity. Very intense exercise: 2+ hours of elevated heart rate activity.  Progress Towards Goal(s):  In progress.   Diagnosis:  NB-2.1 Physical inactivity As related to competing values and possibly lack of kowledge about ther diabetes pathophysiology.  As evidenced by her report and questions.    Intervention:  Nutrition education about physical activity needed for weight loss and weight loss maintenance, diabetes meal planning reveiw Action  Goal:keep food records with weight watchers, considier sending to me with CGM fior feedback  Outcome goal: lower weight and blood sugar, improved nutritional and physical status Coordination of care: none   Teaching Method Utilized: Visual, Auditory,Hands on Handouts given during visit include:avs, diabetes meal planning Barriers to learning/adherence to lifestyle change: competing values Demonstrated degree of understanding via:  Teach Back   Monitoring:  Dietary intake, exercise, cgm, food records, and body weight prn.  Norm Parcelonna , RD 12/08/2018 3:19 PM.

## 2018-12-08 NOTE — Patient Instructions (Signed)
1. Stop metoprolol 2. Monitor your blood pressure, let me know if increasing (130/80) and we will increase yourolmarsartan 3. See me in 3 months

## 2018-12-09 ENCOUNTER — Ambulatory Visit (INDEPENDENT_AMBULATORY_CARE_PROVIDER_SITE_OTHER): Payer: No Typology Code available for payment source | Admitting: Obstetrics & Gynecology

## 2018-12-09 ENCOUNTER — Encounter: Payer: Self-pay | Admitting: Obstetrics & Gynecology

## 2018-12-09 DIAGNOSIS — N898 Other specified noninflammatory disorders of vagina: Secondary | ICD-10-CM | POA: Diagnosis not present

## 2018-12-09 MED ORDER — ESTRADIOL 0.1 MG/GM VA CREA: 1.0000 | TOPICAL_CREAM | VAGINAL | 3 refills | Status: DC

## 2018-12-09 NOTE — Progress Notes (Signed)
TELEHEALTH GYNECOLOGY VIRTUAL VIDEO VISIT ENCOUNTER NOTE  [facility]   I connected with Kelly AdaJerri L Wendland on 12/09/18 at  8:15 AM EDT by WebEx Video Encounter at home and verified that I am speaking with the correct person using two identifiers.   I discussed the limitations, risks, security and privacy concerns of performing an evaluation and management service virtually and the availability of in person appointments. I also discussed with the patient that there may be a patient responsible charge related to this service. The patient expressed understanding and agreed to proceed.   History:  Kelly Gallagher is a 48 y.o. [id number] female being evaluated today for vaginal dryness. Pt tried the coconut oil but she did not like it as she does not like the smell of coconut. She wants to consider other options. She reports that she feels like some of the dryness might bve from her plethora of meds and she is working with her primary care provider to decrease some of her meds. She denies any abnormal vaginal discharge, bleeding, pelvic pain or other concerns.       No past medical history on file. Past Surgical History:  Procedure Laterality Date  . DILATION AND CURETTAGE OF UTERUS     The following portions of the patient's history were reviewed and updated as appropriate: allergies, current medications, past family history, past medical history, past social history, past surgical history and problem list.   Health Maintenance:  Normal pap and negative HRHPV on 09/20/2018  Normal mammogram on 10/12/2018.   Review of Systems:  Pertinent items noted in HPI and remainder of comprehensive ROS otherwise negative.  Physical Exam:   General:  Alert, oriented and cooperative. Patient appears to be in no acute distress.  Mental Status: Normal mood and affect. Normal behavior. Normal judgment and thought content.   Respiratory: Normal respiratory  effort, no problems with respiration noted  Rest of physical exam deferred due to type of encounter  Labs and Imaging Results for orders placed or performed in visit on 12/08/18 (from the past 336 hour(s))  Glucose, capillary   Collection Time: 12/08/18  9:17 AM  Result Value Ref Range   Glucose-Capillary 162 (H) 70 - 99 mg/dL  Results for orders placed or performed in visit on 12/08/18 (from the past 336 hour(s))  POC Hbg A1C   Collection Time: 12/08/18  9:26 AM  Result Value Ref Range   Hemoglobin A1C 7.9 (A) 4.0 - 5.6 %   HbA1c POC (<> result, manual entry)     HbA1c, POC (prediabetic range)     HbA1c, POC (controlled diabetic range)     No results found.     Assessment and Plan:     Vaginal dryness- discussed options with pt. She wants to proceed with topical EES. We reviewed the risks vs benefits.   Estrace cream apply small amount daily for 2 weeks followed by 3-4 times per week.        I discussed the assessment and treatment plan with the patient. The patient was provided an opportunity to ask questions and all were answered. The patient agreed with the plan and demonstrated an understanding of the instructions.   The patient was advised to call back or seek an in-person evaluation/go to the ED if the symptoms worsen or if the condition fails to improve as anticipated.  I provided 10 minutes of face-to-face time during this encounter.   Willodean Rosenthalarolyn Harraway-Smith,  MD [facility] [place]

## 2018-12-09 NOTE — Assessment & Plan Note (Signed)
Received PCV 23 today Will get flu shot through her work

## 2018-12-09 NOTE — Assessment & Plan Note (Signed)
This problem is chronic and controlled.  She is on olmesartan 20, HCTZ 25, and metoprolol 50 twice daily.  Our goal has been to decrease the number of pills she has to take daily.  Olmesartan and HCTZ do not come in a combo pill with her current dosages.  We discussed decreasing her HCTZ to 12.5 to a lower a combo pill but she is concerned about increasing lower extremity edema with a lower diuretic dose.  Therefore, we are going to stop her metoprolol and follow her blood pressure since it is currently low normal.  She has no need for a beta-blocker as she has not had a heart attack and has no heart failure. She is able to check her blood pressure at home.  If it increases steadily above 130/80, I will start a combo pill of olmesartan 40 and HCTZ 25.    PLAN : On losartan 20, HCTZ 25 Stop metoprolol Monitor home blood pressure

## 2018-12-09 NOTE — Progress Notes (Signed)
Patient agrees to Web[facility] visit. Jennifer Howard RN  

## 2018-12-12 ENCOUNTER — Encounter: Payer: Self-pay | Admitting: Internal Medicine

## 2018-12-16 MED FILL — OLMESARTAN MEDOXOMIL 20 MG: 20 | 30 days supply | Qty: 30 | Fill #6

## 2018-12-16 MED FILL — XULTOPHY 100 UNIT-3.6MG/ML: 100-3.6 | 30 days supply | Qty: 15 | Fill #3

## 2018-12-16 MED FILL — LANTUS SOLOSTAR 100 UNITS/M: 100 | 30 days supply | Qty: 15 | Fill #3

## 2018-12-21 MED FILL — GLIMEPIRIDE 4 MG TABLET: 4 | 90 days supply | Qty: 180 | Fill #1

## 2018-12-23 ENCOUNTER — Encounter: Payer: Self-pay | Admitting: Internal Medicine

## 2018-12-30 ENCOUNTER — Telehealth: Payer: Self-pay | Admitting: Pharmacist

## 2018-12-31 MED FILL — ESTRADIOL 0.1 MG/GM CRM: 0.1 | 84 days supply | Qty: 43 | Fill #1

## 2019-01-03 ENCOUNTER — Other Ambulatory Visit: Payer: Self-pay | Admitting: Internal Medicine

## 2019-01-03 DIAGNOSIS — I10 Essential (primary) hypertension: Secondary | ICD-10-CM

## 2019-01-03 MED ORDER — BETAMETHASONE DIPROPIONATE 0.05 % EX CREA: TOPICAL_CREAM | Freq: Two times a day (BID) | CUTANEOUS | 0 refills | Status: DC

## 2019-01-03 MED FILL — BETAMETHASONE DIPROPIONATE: 0.05 | 20 days supply | Qty: 30 | Fill #0

## 2019-01-05 MED ORDER — OLMESARTAN MEDOXOMIL-HCTZ 20-12.5 MG PO TABS: 1.0000 | ORAL_TABLET | Freq: Every day | ORAL | 3 refills | Status: DC

## 2019-01-05 MED FILL — OLMESARTAN-HCTZ 20-12.5 MG: 20-12.5 | 90 days supply | Qty: 90 | Fill #0

## 2019-01-05 NOTE — Addendum Note (Signed)
Addended by: Forde Dandy on: 01/05/2019 10:15 AM   Modules accepted: Orders

## 2019-01-06 NOTE — Telephone Encounter (Signed)
Patient switched to combination olmesartan-HCTZ for simplification

## 2019-01-09 MED FILL — MELOXICAM 15 MG TABLET: 15 | 90 days supply | Qty: 90 | Fill #1

## 2019-01-14 ENCOUNTER — Other Ambulatory Visit: Payer: Self-pay | Admitting: Internal Medicine

## 2019-01-14 DIAGNOSIS — E1142 Type 2 diabetes mellitus with diabetic polyneuropathy: Secondary | ICD-10-CM

## 2019-01-14 MED FILL — OMEPRAZOLE 20 MG CAP: 20 | 90 days supply | Qty: 90 | Fill #3

## 2019-01-14 MED FILL — ATORVASTATIN 20 MG TABLET: 20 | 90 days supply | Qty: 90 | Fill #3

## 2019-01-14 MED FILL — XULTOPHY 100 UNIT-3.6MG/ML: 100-3.6 | 30 days supply | Qty: 15 | Fill #4

## 2019-01-15 MED FILL — METOPROLOL TARTRATE 50 MG T: 50 | 90 days supply | Qty: 180 | Fill #3

## 2019-01-16 MED FILL — LANTUS SOLOSTAR 100 UNITS/M: 100 | 30 days supply | Qty: 15 | Fill #0

## 2019-01-20 MED FILL — FREESTYLE LIBRE 2 SENSOR SY: 28 days supply | Qty: 2 | Fill #2

## 2019-02-09 MED FILL — LANTUS SOLOSTAR 100 UNITS/M: 100 | 30 days supply | Qty: 15 | Fill #1

## 2019-02-24 MED FILL — INVOKAMET XR 150-1,000 MG T: 150-1000 | 90 days supply | Qty: 180 | Fill #1

## 2019-02-24 MED FILL — FREESTYLE LIBRE 14 DAY SENS: 28 days supply | Qty: 2 | Fill #3

## 2019-03-10 MED FILL — METOPROLOL SUCCINATE ER 100: 100 | 90 days supply | Qty: 90 | Fill #1

## 2019-03-11 ENCOUNTER — Other Ambulatory Visit: Payer: Self-pay | Admitting: Internal Medicine

## 2019-03-11 DIAGNOSIS — Z794 Long term (current) use of insulin: Secondary | ICD-10-CM

## 2019-03-11 DIAGNOSIS — E1142 Type 2 diabetes mellitus with diabetic polyneuropathy: Secondary | ICD-10-CM

## 2019-03-13 NOTE — Telephone Encounter (Signed)
Confirmed with pharmacy-no additional refills on file.Kelly Hidden Cassady12/21/202011:40 AM

## 2019-03-14 MED FILL — LANTUS SOLOSTAR 100 UNITS/M: 100 | 90 days supply | Qty: 45 | Fill #0

## 2019-03-15 MED FILL — XULTOPHY 100 UNIT-3.6MG/ML: 100-3.6 | 30 days supply | Qty: 15 | Fill #6

## 2019-03-22 MED FILL — ESTRADIOL 0.1 MG/GM CRM: 0.1 | 84 days supply | Qty: 43 | Fill #2

## 2019-03-28 ENCOUNTER — Other Ambulatory Visit: Payer: Self-pay | Admitting: Internal Medicine

## 2019-03-29 MED FILL — UNIFINE PENTIPS 32GX5/32: 32G X 4 MM | 90 days supply | Qty: 200 | Fill #0

## 2019-03-29 MED FILL — UNIFINE PENTIPS 32GX5/32": 32G X 4 MM | 90 days supply | Qty: 200 | Fill #0

## 2019-03-30 MED FILL — OLMESARTAN-HCTZ 20-12.5 MG: 20-12.5 | 30 days supply | Qty: 30 | Fill #1

## 2019-04-04 ENCOUNTER — Emergency Department (HOSPITAL_COMMUNITY)
Admission: EM | Admit: 2019-04-04 | Discharge: 2019-04-05 | Discharge status: Against Medical Advice | Payer: No Typology Code available for payment source | Attending: Emergency Medicine | Admitting: Emergency Medicine

## 2019-04-04 ENCOUNTER — Encounter (HOSPITAL_COMMUNITY): Payer: Self-pay | Admitting: Emergency Medicine

## 2019-04-04 ENCOUNTER — Other Ambulatory Visit: Payer: Self-pay

## 2019-04-04 DIAGNOSIS — I10 Essential (primary) hypertension: Secondary | ICD-10-CM | POA: Diagnosis not present

## 2019-04-04 DIAGNOSIS — Z888 Allergy status to other drugs, medicaments and biological substances status: Secondary | ICD-10-CM | POA: Insufficient documentation

## 2019-04-04 DIAGNOSIS — S46912A Strain of unspecified muscle, fascia and tendon at shoulder and upper arm level, left arm, initial encounter: Secondary | ICD-10-CM | POA: Diagnosis not present

## 2019-04-04 DIAGNOSIS — E785 Hyperlipidemia, unspecified: Secondary | ICD-10-CM | POA: Diagnosis not present

## 2019-04-04 DIAGNOSIS — Z79899 Other long term (current) drug therapy: Secondary | ICD-10-CM | POA: Insufficient documentation

## 2019-04-04 DIAGNOSIS — X58XXXA Exposure to other specified factors, initial encounter: Secondary | ICD-10-CM | POA: Insufficient documentation

## 2019-04-04 DIAGNOSIS — E119 Type 2 diabetes mellitus without complications: Secondary | ICD-10-CM | POA: Insufficient documentation

## 2019-04-04 DIAGNOSIS — Z794 Long term (current) use of insulin: Secondary | ICD-10-CM | POA: Diagnosis not present

## 2019-04-04 DIAGNOSIS — Z5321 Procedure and treatment not carried out due to patient leaving prior to being seen by health care provider: Secondary | ICD-10-CM | POA: Insufficient documentation

## 2019-04-04 DIAGNOSIS — M25512 Pain in left shoulder: Secondary | ICD-10-CM | POA: Diagnosis present

## 2019-04-04 LAB — CBC WITH DIFFERENTIAL/PLATELET
Abs Immature Granulocytes: 0.03 10*3/uL (ref 0.00–0.07)
Basophils Absolute: 0 10*3/uL (ref 0.0–0.1)
Basophils Relative: 0 %
Eosinophils Absolute: 0.2 10*3/uL (ref 0.0–0.5)
Eosinophils Relative: 2 %
HCT: 47.6 % — ABNORMAL HIGH (ref 36.0–46.0)
Hemoglobin: 14.9 g/dL (ref 12.0–15.0)
Immature Granulocytes: 0 %
Lymphocytes Relative: 33 %
Lymphs Abs: 3.5 10*3/uL (ref 0.7–4.0)
MCH: 27.3 pg (ref 26.0–34.0)
MCHC: 31.3 g/dL (ref 30.0–36.0)
MCV: 87.3 fL (ref 80.0–100.0)
Monocytes Absolute: 0.7 10*3/uL (ref 0.1–1.0)
Monocytes Relative: 6 %
Neutro Abs: 6.1 10*3/uL (ref 1.7–7.7)
Neutrophils Relative %: 59 %
Platelets: 297 10*3/uL (ref 150–400)
RBC: 5.45 MIL/uL — ABNORMAL HIGH (ref 3.87–5.11)
RDW: 13 % (ref 11.5–15.5)
WBC: 10.5 10*3/uL (ref 4.0–10.5)
nRBC: 0 % (ref 0.0–0.2)

## 2019-04-04 LAB — BASIC METABOLIC PANEL
Anion gap: 10 (ref 5–15)
BUN: 18 mg/dL (ref 6–20)
CO2: 26 mmol/L (ref 22–32)
Calcium: 9.3 mg/dL (ref 8.9–10.3)
Chloride: 98 mmol/L (ref 98–111)
Creatinine, Ser: 0.74 mg/dL (ref 0.44–1.00)
GFR calc Af Amer: >60 mL/min (ref >60)
GFR calc non Af Amer: >60 mL/min (ref >60)
Glucose, Bld: 197 mg/dL — ABNORMAL HIGH (ref 70–99)
Potassium: 3.9 mmol/L (ref 3.5–5.1)
Sodium: 134 mmol/L — ABNORMAL LOW (ref 135–145)

## 2019-04-04 NOTE — ED Triage Notes (Signed)
Patient reports left shoulder pain radiating to left arm onset this morning , denies injury , respirations unlabored , no fever or chills .

## 2019-04-05 ENCOUNTER — Emergency Department (HOSPITAL_BASED_OUTPATIENT_CLINIC_OR_DEPARTMENT_OTHER)
Admission: EM | Admit: 2019-04-05 | Discharge: 2019-04-05 | Disposition: A | Discharge status: Home / Self Care | Payer: No Typology Code available for payment source | Source: Home / Self Care | Attending: Emergency Medicine | Admitting: Emergency Medicine

## 2019-04-05 ENCOUNTER — Other Ambulatory Visit: Payer: Self-pay

## 2019-04-05 ENCOUNTER — Emergency Department (HOSPITAL_BASED_OUTPATIENT_CLINIC_OR_DEPARTMENT_OTHER): Payer: No Typology Code available for payment source

## 2019-04-05 ENCOUNTER — Encounter (HOSPITAL_BASED_OUTPATIENT_CLINIC_OR_DEPARTMENT_OTHER): Payer: Self-pay | Admitting: Emergency Medicine

## 2019-04-05 DIAGNOSIS — Y939 Activity, unspecified: Secondary | ICD-10-CM | POA: Insufficient documentation

## 2019-04-05 DIAGNOSIS — T148XXA Other injury of unspecified body region, initial encounter: Secondary | ICD-10-CM

## 2019-04-05 DIAGNOSIS — E119 Type 2 diabetes mellitus without complications: Secondary | ICD-10-CM | POA: Insufficient documentation

## 2019-04-05 DIAGNOSIS — Y929 Unspecified place or not applicable: Secondary | ICD-10-CM | POA: Insufficient documentation

## 2019-04-05 DIAGNOSIS — X58XXXA Exposure to other specified factors, initial encounter: Secondary | ICD-10-CM | POA: Insufficient documentation

## 2019-04-05 DIAGNOSIS — S46912A Strain of unspecified muscle, fascia and tendon at shoulder and upper arm level, left arm, initial encounter: Secondary | ICD-10-CM | POA: Insufficient documentation

## 2019-04-05 DIAGNOSIS — Z79899 Other long term (current) drug therapy: Secondary | ICD-10-CM | POA: Insufficient documentation

## 2019-04-05 DIAGNOSIS — E785 Hyperlipidemia, unspecified: Secondary | ICD-10-CM | POA: Insufficient documentation

## 2019-04-05 DIAGNOSIS — Z794 Long term (current) use of insulin: Secondary | ICD-10-CM | POA: Insufficient documentation

## 2019-04-05 DIAGNOSIS — I10 Essential (primary) hypertension: Secondary | ICD-10-CM | POA: Insufficient documentation

## 2019-04-05 DIAGNOSIS — Z888 Allergy status to other drugs, medicaments and biological substances status: Secondary | ICD-10-CM | POA: Insufficient documentation

## 2019-04-05 DIAGNOSIS — Y999 Unspecified external cause status: Secondary | ICD-10-CM | POA: Insufficient documentation

## 2019-04-05 HISTORY — DX: Type 2 diabetes mellitus without complications: E11.9

## 2019-04-05 HISTORY — DX: Essential (primary) hypertension: I10

## 2019-04-05 LAB — TROPONIN I (HIGH SENSITIVITY): Troponin I (High Sensitivity): 2 ng/L (ref <18)

## 2019-04-05 MED ORDER — IBUPROFEN 800 MG PO TABS
800.0000 mg | ORAL_TABLET | Freq: Once | ORAL | Status: AC
  Administered 2019-04-05: 800 mg via ORAL
  Filled 2019-04-05: qty 1

## 2019-04-05 MED ORDER — ACETAMINOPHEN 500 MG PO TABS
1000.0000 mg | ORAL_TABLET | Freq: Once | ORAL | Status: AC
  Administered 2019-04-05: 1000 mg via ORAL
  Filled 2019-04-05: qty 2

## 2019-04-05 MED ORDER — NAPROXEN 500 MG PO TABS: 500.0000 mg | ORAL_TABLET | Freq: Two times a day (BID) | ORAL | 0 refills | Status: DC

## 2019-04-05 MED ORDER — LIDOCAINE 5 % EX PTCH: 1.0000 | MEDICATED_PATCH | CUTANEOUS | 0 refills | Status: DC

## 2019-04-05 MED FILL — NAPROXEN 500 MG TABS: 500 | 7 days supply | Qty: 14 | Fill #0

## 2019-04-05 MED FILL — LIDOCAINE PATCH 5%: 5 | 5 days supply | Qty: 10 | Fill #0

## 2019-04-05 NOTE — ED Provider Notes (Signed)
[facility] EMERGENCY DEPARTMENT Provider Note   CSN: [id number] Arrival date & time: 04/05/19  0109     History Chief Complaint  Patient presents with  . Hypertension  . Arm Pain    Kelly Gallagher is a 49 y.o. female.  The history is provided by the patient.  Hypertension This is a new problem. The current episode started 6 to 12 hours ago. The problem occurs constantly. The problem has been resolved. Pertinent negatives include no chest pain, no abdominal pain, no headaches and no shortness of breath. Nothing aggravates the symptoms. Nothing relieves the symptoms. She has tried nothing for the symptoms. The treatment provided significant relief.  Arm Pain This is a recurrent problem. The current episode started more than 2 days ago. The problem occurs constantly. The problem has not changed since onset.Pertinent negatives include no chest pain, no abdominal pain, no headaches and no shortness of breath. Nothing aggravates the symptoms. Nothing relieves the symptoms. She has tried nothing for the symptoms. The treatment provided no relief.  Kelly Gallagher is a 40 YOF who presents with several days of left upper extremity pain.  She also noted elevated blood pressures today.  She presented for an evaluation of HTN and LUE pain. States she was thinking about it and she injured her LUE during a move in September but does not recall recent trauma.  No CP, no DOE, no SOB.  No n/v/d.  No weakness nor numbness. No HA.       Past Medical History:  Diagnosis Date  . Diabetes mellitus without complication (HCC)   . Hypertension     Patient Active Problem List   Diagnosis Date Noted  . Musculoskeletal pain 04/29/2018  . Healthcare maintenance 04/29/2018  . DM (diabetes mellitus) type II controlled, 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HCC) 04/28/2018  . Hypertension 04/28/2018  . Generalized anxiety disorder 04/28/2018  . Hyperlipidemia 04/28/2018  . Morbid obesity (HCC) 04/28/2018  . IUD  (intrauterine device) in place 04/28/2018  . GERD (gastroesophageal reflux disease) 04/28/2018  . Reactive airway disease 04/28/2018  . Vitamin B12 deficiency 04/28/2018    Past Surgical History:  Procedure Laterality Date  . CHOLECYSTECTOMY    . DILATION AND CURETTAGE OF UTERUS       OB History    Gravida  1   Para      Term      Preterm      AB  1   Living        SAB  1   TAB      Ectopic      Multiple      Live Births           Obstetric Comments  "chemical pregnancy"        Family History  Problem Relation Age of Onset  . Cancer Father        Head and neck cancer. Has PEG  . Lupus Sister   . Breast cancer Other   . Breast cancer Other     Social History   Tobacco Use  . Smoking status: Never Smoker  . Smokeless tobacco: Never Used  . Tobacco comment: lives with smokers   Substance Use Topics  . Alcohol use: Not Currently    Comment: Rarely.  . Drug use: Not on file    Home Medications Prior to Admission medications   Medication Sig Start Date End Date Taking? Authorizing Provider  Albuterol Sulfate 108 (90 Base) MCG/ACT AEPB Inhale 2 puffs  into the lungs every 4 (four) hours as needed. 04/28/18   Bartholomew Crews, MD  ALPRAZolam Duanne Moron) 0.5 MG tablet Take by mouth. 12/30/15   [provider]  atorvastatin (LIPITOR) 20 MG tablet Take 1 tablet (20 mg total) by mouth daily. 04/28/18   Bartholomew Crews, MD  betamethasone dipropionate 0.05 % cream Apply topically 2 (two) times daily. 01/03/19   Bartholomew Crews, MD  Canagliflozin-metFORMIN HCl ER (INVOKAMET XR) [phone] MG TB24 Take 1 tablet by mouth 2 (two) times daily with a meal. 12/01/18   Bartholomew Crews, MD  Continuous Blood Gluc Receiver (FREESTYLE LIBRE 2 READER SYSTM) DEVI 1 each by Does not apply route 4 (four) times daily. 11/01/18   Bartholomew Crews, MD  Continuous Blood Gluc Sensor (FREESTYLE LIBRE 2 SENSOR SYSTM) MISC 1 each by Does not apply route 4 (four)  times daily. 11/01/18   Bartholomew Crews, MD  estradiol (ESTRACE VAGINAL) 0.1 MG/GM vaginal cream Place 1 Applicatorful vaginally 3 (three) times a week. Initially use nightly for 2 weeks. 12/09/18   Lavonia Drafts, MD  fluticasone (FLONASE) 50 MCG/ACT nasal spray Place 2 sprays into both nostrils daily for 10 days. 05/06/18 05/16/18  Kara Dies, NP  Insulin Degludec-Liraglutide (XULTOPHY) 100-3.6 UNIT-MG/ML SOPN Inject 50 Units into the skin daily. 09/22/18   Bartholomew Crews, MD  Insulin Pen Needle (UNIFINE PENTIPS) 32G X 4 MM MISC Use to inject insulin into the skin 2 times daily 03/29/19   Bartholomew Crews, MD  LANTUS SOLOSTAR 100 UNIT/ML Solostar Pen INJECT 50 UNITS INTO THE SKIN EVERY MORNING 03/14/19   Bartholomew Crews, MD  lidocaine (LIDODERM) 5 % Place 1 patch onto the skin daily. Remove & Discard patch within 12 hours or as directed by MD 04/05/19   Randal Buba, Lucy Boardman, MD  meloxicam (MOBIC) 15 MG tablet TAKE 1 TABLET BY MOUTH ONCE A DAY AS NEEDED FOR PAIN 10/17/18   Bartholomew Crews, MD  metoprolol succinate (TOPROL XL) 100 MG 24 hr tablet Take 1 tablet (100 mg total) by mouth daily. Take with or immediately following a meal. Patient not taking: Reported on 12/09/2018 09/08/18 09/08/19  Bartholomew Crews, MD  Multiple Vitamin (MULTIVITAMIN) tablet Take 1 tablet by mouth daily.    [provider]  naproxen (NAPROSYN) 500 MG tablet Take 1 tablet (500 mg total) by mouth 2 (two) times daily with a meal. 04/05/19   Doni Bacha, MD  olmesartan-hydrochlorothiazide (BENICAR HCT) 20-12.5 MG tablet Take 1 tablet by mouth daily. 01/05/19   Bartholomew Crews, MD  Omega-3 Fatty Acids (FISH OIL) 1000 MG CAPS Take by mouth.    [provider]  omeprazole (PRILOSEC) 20 MG capsule Take 1 capsule (20 mg total) by mouth daily. 04/28/18   Bartholomew Crews, MD  Probiotic Product (PROBIOTIC-10 PO) Take by mouth.    [provider]  vitamin B-12  (CYANOCOBALAMIN) 1000 MCG tablet Take by mouth.    [provider]    Allergies    Ace inhibitors and Effexor xr [venlafaxine hcl]  Review of Systems   Review of Systems  Constitutional: Negative for fever.  HENT: Negative for congestion.   Eyes: Negative for visual disturbance.  Respiratory: Negative for cough and shortness of breath.   Cardiovascular: Negative for chest pain.  Gastrointestinal: Negative for abdominal pain.  Genitourinary: Negative for difficulty urinating.  Musculoskeletal: Positive for arthralgias.  Neurological: Negative for weakness, numbness and headaches.  Psychiatric/Behavioral: Negative for agitation.  All  other systems reviewed and are negative.   Physical Exam Updated Vital Signs BP 119/76   Pulse 66   Temp 98 F (36.7 C)   Resp 16   Ht 5\' 5"  (1.651 m)   Wt 113.4 kg   SpO2 98%   BMI 41.60 kg/m   Physical Exam Vitals and nursing note reviewed.  Constitutional:      General: She is not in acute distress.    Appearance: Normal appearance. She is not diaphoretic.  HENT:     Head: Normocephalic and atraumatic.     Nose: Nose normal.  Eyes:     Extraocular Movements: Extraocular movements intact.     Conjunctiva/sclera: Conjunctivae normal.     Pupils: Pupils are equal, round, and reactive to light.  Cardiovascular:     Rate and Rhythm: Normal rate and regular rhythm.     Pulses: Normal pulses.     Heart sounds: Normal heart sounds.  Pulmonary:     Effort: Pulmonary effort is normal.     Breath sounds: Normal breath sounds.  Abdominal:     General: Abdomen is flat. Bowel sounds are normal.     Tenderness: There is no abdominal tenderness. There is no guarding or rebound.  Musculoskeletal:        General: Normal range of motion.     Left shoulder: Tenderness present. No swelling, deformity, effusion, laceration, bony tenderness or crepitus. Normal range of motion. Normal strength. Normal pulse.     Left upper arm: Tenderness  present. No swelling, edema, deformity, lacerations or bony tenderness.     Left elbow: Normal.     Left forearm: Normal.     Left wrist: Normal. No snuff box tenderness.     Left hand: Normal.       Arms:     Cervical back: Normal range of motion and neck supple.  Skin:    General: Skin is warm and dry.     Capillary Refill: Capillary refill takes less than 2 seconds.  Neurological:     General: No focal deficit present.     Mental Status: She is alert and oriented to person, place, and time.     Deep Tendon Reflexes: Reflexes normal.  Psychiatric:        Mood and Affect: Mood normal.        Behavior: Behavior normal.     ED Results / Procedures / Treatments   Labs (all labs ordered are listed, but only abnormal results are displayed) Results for orders placed or performed during the hospital encounter of 04/05/19  Troponin I (High Sensitivity)  Result Value Ref Range   Troponin I (High Sensitivity) 2 <18 ng/L   DG Chest Portable 1 View  Result Date: 04/05/2019 CLINICAL DATA:  49 year old female with pain radiating to the left neck and arm. Hypertensive. EXAM: PORTABLE CHEST 1 VIEW COMPARISON:  None. FINDINGS: Portable AP upright view at 0133 hours. Normal cardiac size and mediastinal contours. Visualized tracheal air column is within normal limits. Normal lung volumes. Allowing for portable technique the lungs are clear. No pneumothorax. No acute osseous abnormality identified, mild asymmetric degeneration at the left shoulder. IMPRESSION: Negative portable chest. Electronically Signed   By: 51 M.D.   On: 04/05/2019 01:48    EKG  Date: 04/05/2019  Rate: 76  Rhythm: normal sinus rhythm  QRS Axis: normal  Intervals: normal  ST/T Wave abnormalities: normal  Conduction Disutrbances: none  Narrative Interpretation: unremarkable     Radiology DG  Chest Portable 1 View  Result Date: 04/05/2019 CLINICAL DATA:  49 year old female with pain radiating to the left neck and  arm. Hypertensive. EXAM: PORTABLE CHEST 1 VIEW COMPARISON:  None. FINDINGS: Portable AP upright view at 0133 hours. Normal cardiac size and mediastinal contours. Visualized tracheal air column is within normal limits. Normal lung volumes. Allowing for portable technique the lungs are clear. No pneumothorax. No acute osseous abnormality identified, mild asymmetric degeneration at the left shoulder. IMPRESSION: Negative portable chest. Electronically Signed   By: Odessa Fleming M.D.   On: 04/05/2019 01:48    Procedures Procedures (including critical care time)  Medications Ordered in ED Medications  ibuprofen (ADVIL) tablet 800 mg (800 mg Oral Given 04/05/19 0201)  acetaminophen (TYLENOL) tablet 1,000 mg (1,000 mg Oral Given 04/05/19 0201)    ED Course  I have reviewed the triage vital signs and the nursing notes.  Pertinent labs & imaging results that were available during my care of the patient were reviewed by me and considered in my medical decision making (see chart for details).    Additional labs were drawn at [facility] prior to patient's arrival at [facility]  These were reviewed and are normal. The patient's history and exam as not consistent with cardiac etiology for pain.  They are consistent with muscle strain.  Given the ongoing nature of the LUE pain and elevated BP and that it is reproducible without chest pain nor exertional symptoms one troponin with normal EKG is sufficient to exclude ACS. The patient's heart score is one and the patient is very low risk for MACE.  I suspect that the patient's BP was elevated due to pain and stress related to that pain.  Her BP is normal in the ED without any intervention.  I have spoken to the patient at length regarding the plan of care and I have reassured the patient that he labs, imaging and EKG are normal.  We discussed gentle range of motion exercises for the left shoulder.  I have advised NSAIDs, lidoderm, and heat therapy using thermacare heating wraps.     Kelly Gallagher was evaluated in Emergency Department on 04/05/2019 for the symptoms described in the history of present illness. She was evaluated in the context of the global COVID-19 pandemic, which necessitated consideration that the patient might be at risk for infection with the SARS-CoV-2 virus that causes COVID-19. Institutional protocols and algorithms that pertain to the evaluation of patients at risk for COVID-19 are in a state of rapid change based on information released by regulatory bodies including the CDC and federal and state organizations. These policies and algorithms were followed during the patient's care in the ED.  Final Clinical Impression(s) / ED Diagnoses Final diagnoses:  Muscle strain   Return for intractable cough, coughing up blood,fevers >100.4 unrelieved by medication, shortness of breath, intractable vomiting, chest pain, shortness of breath, weakness,numbness, changes in speech, facial asymmetry,abdominal pain, passing out,Inability to tolerate liquids or food, cough, altered mental status or any concerns. No signs of systemic illness or infection. The patient is nontoxic-appearing on exam and vital signs are within normal limits.   I have reviewed the triage vital signs and the nursing notes. Pertinent labs &imaging results that were available during my care of the patient were reviewed by me and considered in my medical decision making (see chart for details).  After history, exam, and medical workup I feel the patient has been appropriately medically screened and is safe for discharge home. Pertinent diagnoses  were discussed with the patient. Patient was given return    Rx / DC Orders ED Discharge Orders         Ordered    naproxen (NAPROSYN) 500 MG tablet  2 times daily with meals     04/05/19 0252    lidocaine (LIDODERM) 5 %  Every 24 hours     04/05/19 0252           Kaytlan Behrman, MD 04/05/19 [id number]

## 2019-04-05 NOTE — ED Triage Notes (Signed)
Pt c/o pain radiating down left arm that worsens with movement and also has pain to left side of neck. Pt reports blood pressure elevated tonight.

## 2019-04-10 ENCOUNTER — Encounter: Payer: Self-pay | Admitting: Internal Medicine

## 2019-04-11 ENCOUNTER — Ambulatory Visit: Payer: No Typology Code available for payment source

## 2019-04-14 ENCOUNTER — Other Ambulatory Visit: Payer: Self-pay | Admitting: Internal Medicine

## 2019-04-14 MED FILL — ATORVASTATIN 20 MG TABLET: 20 | 90 days supply | Qty: 90 | Fill #0

## 2019-04-14 MED FILL — XULTOPHY 100 UNIT-3.6MG/ML: 100-3.6 | 30 days supply | Qty: 15 | Fill #7

## 2019-04-14 MED FILL — OMEPRAZOLE 20 MG CAP: 20 | 90 days supply | Qty: 90 | Fill #0

## 2019-04-16 MED FILL — FREESTYLE LIBRE 2 SENSOR SY: 28 days supply | Qty: 2 | Fill #4

## 2019-04-25 ENCOUNTER — Encounter: Payer: Self-pay | Admitting: Internal Medicine

## 2019-04-25 ENCOUNTER — Ambulatory Visit (INDEPENDENT_AMBULATORY_CARE_PROVIDER_SITE_OTHER): Payer: No Typology Code available for payment source | Admitting: Internal Medicine

## 2019-04-25 ENCOUNTER — Other Ambulatory Visit (HOSPITAL_COMMUNITY)
Admission: RE | Admit: 2019-04-25 | Discharge: 2019-04-25 | Disposition: A | Discharge status: Home / Self Care | Payer: No Typology Code available for payment source | Source: Ambulatory Visit | Attending: Internal Medicine | Admitting: Internal Medicine

## 2019-04-25 VITALS — BP 120/80 | HR 65 | Temp 98.0°F | Ht 65.0 in | Wt 248.4 lb

## 2019-04-25 DIAGNOSIS — B373 Candidiasis of vulva and vagina: Secondary | ICD-10-CM | POA: Diagnosis not present

## 2019-04-25 DIAGNOSIS — R300 Dysuria: Secondary | ICD-10-CM | POA: Diagnosis present

## 2019-04-25 DIAGNOSIS — I10 Essential (primary) hypertension: Secondary | ICD-10-CM | POA: Diagnosis not present

## 2019-04-25 DIAGNOSIS — E119 Type 2 diabetes mellitus without complications: Secondary | ICD-10-CM | POA: Diagnosis not present

## 2019-04-25 DIAGNOSIS — B3731 Acute candidiasis of vulva and vagina: Secondary | ICD-10-CM | POA: Insufficient documentation

## 2019-04-25 LAB — POCT URINALYSIS DIPSTICK
Bilirubin, UA: NEGATIVE
Blood, UA: NEGATIVE
Glucose, UA: POSITIVE — AB
Ketones, UA: 15
Leukocytes, UA: NEGATIVE
Nitrite, UA: NEGATIVE
Protein, UA: NEGATIVE
Spec Grav, UA: 1.025 (ref 1.010–1.025)
Urobilinogen, UA: 0.2 E.U./dL
pH, UA: 5 (ref 5.0–8.0)

## 2019-04-25 MED ORDER — FLUCONAZOLE 100 MG PO TABS: 150.0000 mg | ORAL_TABLET | Freq: Every day | ORAL | 2 refills | Status: AC

## 2019-04-25 MED FILL — FLUCONAZOLE 150 MG TABS: 150 | 18 days supply | Qty: 6 | Fill #0

## 2019-04-25 NOTE — Assessment & Plan Note (Addendum)
Vaginal discharge: She complains of a 1 week history of dysuria, urinary frequency as well as minimal thin white vaginal discharge.  She denies fevers, chills or any other systemic findings.  She states that her symptoms have been getting progressively worse and prompted her to report to the clinic today.  A pelvic exam was performed, vulva examination was unremarkable however there was visualized minimal thin white vaginal discharge.  Urinalysis performed in the clinic reviewed glucosuria greater than 1000 mg/dL, ketones 15 mg/dL, negative nitrites and leukocytes.  Assessment: Most likely vaginal candidiasis from SGLT2 inhibitor use  Plan: -Diflucan 150 mg today and repeat in 3 days. -Return to clinic as needed -Follow-up cervicovaginal axillary results  ADDENDUM:  Candida Glabrata Positive

## 2019-04-25 NOTE — Progress Notes (Signed)
   CC: Dysuria, vaginal discharge  HPI:  [name] is a 49 y.o. with medical history of diabetes mellitus and hypertension here for evaluation of dysuria and vaginal discharge.  Please see problem based charting for further details.    Past Medical History:  Diagnosis Date  . Diabetes mellitus without complication (HCC)   . Hypertension    Review of Systems:  As per HPI  Physical Exam:  Vitals:   04/25/19 1545  Weight: 248 lb 6.4 oz (112.7 kg)  Height: 5\' 5"  (1.651 m)   Physical Exam  Constitutional: She is well-developed, well-nourished, and in no distress.  Genitourinary:    Vulva and cervix normal.     Cervix is not fixed.  Cervix exhibits no motion tenderness, no lesion and no tenderness.    No vulval lesion, erythema, tenderness, rash or exudate noted.     Vaginal discharge present.     No vaginal rugosity, mucosa abnormailty or exudate.     No lesions in the vagina.     Thin  odorless and white found.     Assessment & Plan:   See Encounters Tab for problem based charting.  Patient discussed with Dr. 

## 2019-04-25 NOTE — Patient Instructions (Signed)
Kelly Gallagher,   Thanks for coming in to see [place]. It looks like you have yeast infection. I will prescribe diflucan.   Take 150 mg today and another 150mg  in 3 days.   I also gave you 2 refills because the invokamet does put you at risk for recurrent yeast infections.   Take Care! Dr.  Please call the internal medicine center clinic if you have any questions or concerns, we may be able to help and keep you from a long and expensive emergency room wait. Our clinic and after hours phone number is [phone], the best time to call is Monday through Friday 9 am to 4 pm but there is always someone available 24/7 if you have an emergency. If you need medication refills please notify your pharmacy one week in advance and they will send Friday a request.

## 2019-04-25 NOTE — Addendum Note (Signed)
Addended by: Yvette Rack on: 04/25/2019 04:28 PM   Modules accepted: Orders

## 2019-04-26 ENCOUNTER — Other Ambulatory Visit: Payer: Self-pay | Admitting: Internal Medicine

## 2019-04-26 LAB — CERVICOVAGINAL ANCILLARY ONLY
Bacterial Vaginitis (gardnerella): NEGATIVE
Candida Glabrata: POSITIVE — AB
Candida Vaginitis: NEGATIVE
Comment: NEGATIVE
Comment: NEGATIVE
Comment: NEGATIVE
Comment: NEGATIVE
Trichomonas: NEGATIVE

## 2019-04-29 MED FILL — OLMESARTAN-HCTZ 20-12.5 MG: 20-12.5 | 30 days supply | Qty: 30 | Fill #2

## 2019-04-30 NOTE — Progress Notes (Signed)
Internal Medicine Clinic Attending  Case discussed with Dr. Agyei at the time of the visit.  We reviewed the resident's history and exam and pertinent patient test results.  I agree with the assessment, diagnosis, and plan of care documented in the resident's note.    

## 2019-05-09 ENCOUNTER — Encounter: Payer: Self-pay | Admitting: Internal Medicine

## 2019-05-10 ENCOUNTER — Other Ambulatory Visit: Payer: Self-pay | Admitting: Internal Medicine

## 2019-05-10 MED FILL — FREESTYLE LIBRE 2 SENSOR SY: 28 days supply | Qty: 2 | Fill #5

## 2019-05-10 MED FILL — MELOXICAM 15 MG TABLET: 15 | 90 days supply | Qty: 90 | Fill #0

## 2019-05-12 ENCOUNTER — Other Ambulatory Visit: Payer: Self-pay | Admitting: *Deleted

## 2019-05-12 DIAGNOSIS — E1142 Type 2 diabetes mellitus with diabetic polyneuropathy: Secondary | ICD-10-CM

## 2019-05-12 DIAGNOSIS — Z794 Long term (current) use of insulin: Secondary | ICD-10-CM

## 2019-05-12 MED ORDER — LANTUS SOLOSTAR 100 UNIT/ML ~~LOC~~ SOPN: 50.0000 [IU] | PEN_INJECTOR | SUBCUTANEOUS | 3 refills | Status: DC

## 2019-05-15 MED FILL — XULTOPHY 100 UNIT-3.6MG/ML: 100-3.6 | 30 days supply | Qty: 15 | Fill #8

## 2019-05-24 MED FILL — INVOKAMET XR 150-1,000 MG T: 150-1000 | 90 days supply | Qty: 180 | Fill #2

## 2019-05-25 MED FILL — LANTUS SOLOSTAR 100 UNITS/M: 100 | 30 days supply | Qty: 15 | Fill #0

## 2019-05-29 MED FILL — OLMESARTAN-HCTZ 20-12.5 MG: 20-12.5 | 30 days supply | Qty: 30 | Fill #3

## 2019-06-01 ENCOUNTER — Encounter: Payer: No Typology Code available for payment source | Admitting: Internal Medicine

## 2019-06-08 ENCOUNTER — Encounter: Payer: No Typology Code available for payment source | Admitting: Internal Medicine

## 2019-06-08 MED FILL — FREESTYLE LIBRE 2 SENSOR SY: 28 days supply | Qty: 2 | Fill #6

## 2019-06-13 MED FILL — XULTOPHY 100 UNIT-3.6MG/ML: 100-3.6 | 30 days supply | Qty: 15 | Fill #9

## 2019-06-14 MED FILL — ESTRADIOL 0.1 MG/GM CRM: 0.1 | 84 days supply | Qty: 43 | Fill #3

## 2019-06-15 ENCOUNTER — Encounter: Payer: Self-pay | Admitting: Internal Medicine

## 2019-06-15 ENCOUNTER — Other Ambulatory Visit: Payer: Self-pay

## 2019-06-15 ENCOUNTER — Ambulatory Visit (INDEPENDENT_AMBULATORY_CARE_PROVIDER_SITE_OTHER): Payer: No Typology Code available for payment source | Admitting: Internal Medicine

## 2019-06-15 VITALS — BP 116/63 | HR 68 | Temp 98.4°F | Wt 244.6 lb

## 2019-06-15 DIAGNOSIS — E1149 Type 2 diabetes mellitus with other diabetic neurological complication: Secondary | ICD-10-CM | POA: Diagnosis not present

## 2019-06-15 DIAGNOSIS — Z79899 Other long term (current) drug therapy: Secondary | ICD-10-CM

## 2019-06-15 DIAGNOSIS — F411 Generalized anxiety disorder: Secondary | ICD-10-CM | POA: Diagnosis not present

## 2019-06-15 DIAGNOSIS — Z794 Long term (current) use of insulin: Secondary | ICD-10-CM

## 2019-06-15 DIAGNOSIS — M25512 Pain in left shoulder: Secondary | ICD-10-CM

## 2019-06-15 DIAGNOSIS — I10 Essential (primary) hypertension: Secondary | ICD-10-CM

## 2019-06-15 DIAGNOSIS — E785 Hyperlipidemia, unspecified: Secondary | ICD-10-CM

## 2019-06-15 DIAGNOSIS — E1142 Type 2 diabetes mellitus with diabetic polyneuropathy: Secondary | ICD-10-CM | POA: Diagnosis not present

## 2019-06-15 DIAGNOSIS — F419 Anxiety disorder, unspecified: Secondary | ICD-10-CM

## 2019-06-15 LAB — POCT GLYCOSYLATED HEMOGLOBIN (HGB A1C): Hemoglobin A1C: 7.9 % — AB (ref 4.0–5.6)

## 2019-06-15 LAB — GLUCOSE, CAPILLARY: Glucose-Capillary: 160 mg/dL — ABNORMAL HIGH (ref 70–99)

## 2019-06-15 MED ORDER — DULOXETINE HCL 60 MG PO CPEP: 60.0000 mg | ORAL_CAPSULE | Freq: Every day | ORAL | 2 refills | Status: DC

## 2019-06-15 MED ORDER — LANTUS SOLOSTAR 100 UNIT/ML ~~LOC~~ SOPN: 75.0000 [IU] | PEN_INJECTOR | SUBCUTANEOUS | 5 refills | Status: DC

## 2019-06-15 MED ORDER — DULOXETINE HCL 30 MG PO CPEP: 30.0000 mg | ORAL_CAPSULE | Freq: Every day | ORAL | 0 refills | Status: DC

## 2019-06-15 MED ORDER — ALPRAZOLAM 0.5 MG PO TABS: 0.5000 mg | ORAL_TABLET | Freq: Every day | ORAL | 1 refills | Status: DC | PRN

## 2019-06-15 MED FILL — DULoxetine HCL 30 MG CPEP: 30 | 7 days supply | Qty: 7 | Fill #0

## 2019-06-15 MED FILL — DULOXETINE HCL 60 MG CPEP: 60 | 30 days supply | Qty: 30 | Fill #0

## 2019-06-15 MED FILL — ALPRAZolam 0.5 MG TABS: 0.5 | 30 days supply | Qty: 30 | Fill #0

## 2019-06-15 NOTE — Progress Notes (Signed)
   Subjective:    Patient ID: Kelly Gallagher, female    DOB: 12-18-1970, 49 y.o.   MRN: [id number]  HPI  PAUL TORPEY is here for DM FU. Please see the A&P for the status of the pt's chronic medical problems.  ROS : per ROS section and in problem oriented charting. All other systems are negative.  PMHx, Soc hx, and / or Fam hx : Married.   Review of Systems  Cardiovascular: Positive for palpitations.  Psychiatric/Behavioral: Positive for sleep disturbance.       Increased stress Energy level okay Cannot calm down Sensation of throat closing, cannot read, and palpitations       Objective:   Physical Exam Constitutional:      General: She is not in acute distress.    Appearance: Normal appearance. She is not ill-appearing, toxic-appearing or diaphoretic.  HENT:     Head: Normocephalic and atraumatic.     Right Ear: External ear normal.     Left Ear: External ear normal.  Eyes:     General: No scleral icterus.       Right eye: No discharge.        Left eye: No discharge.  Skin:    General: Skin is warm and dry.  Neurological:     General: No focal deficit present.     Mental Status: She is alert. Mental status is at baseline.  Psychiatric:        Mood and Affect: Mood normal.        Behavior: Behavior normal.        Thought Content: Thought content normal.        Judgment: Judgment normal.           Assessment & Plan:

## 2019-06-15 NOTE — Patient Instructions (Signed)
1. I will complete refills this afternoon 2. Take the Cymbalta at night tome, 30 mg for one week then 60 mg 3. Increase Lantus by 2 units every three days until CBG's are at goals 4.  I will complete referrals later today

## 2019-06-18 MED FILL — LANTUS SOLOSTAR 100 UNITS/M: 100 | 28 days supply | Qty: 21 | Fill #0

## 2019-06-19 ENCOUNTER — Other Ambulatory Visit: Payer: Self-pay | Admitting: Internal Medicine

## 2019-06-19 NOTE — Assessment & Plan Note (Signed)
This problem is chronic and uncontrolled.  Her A1c trend has been 7.5 - 7.9 - 7.9 today.  She was able to successfully titrate the glimepiride off.  She remains on Metformin thousand twice daily, Lantus 50 in the morning, Lantus liraglutide combination 50 units in the evening, and canagliflozin 300 twice a day.  She has a Actor and we downloaded it today.  I personally viewed her download and interpreted the results.  The download covered a 14-day time span.  Her average glucose was 165.  71% of her readings were in target, she had no lows.  28% were high and 1% were very high.  Her hyperglycemia is frequently after breakfast and then also in the afternoon.  We discussed options including adding prandial insulin, targeting her stress which can increase sugar, and increasing Lantus to shift her curve downwards.  Together we chose option #3 and she is going to increase her Lantus by 2 units every 3 days until she notices most of her sugars are within target.  Her meter alerts her to hypoglycemia but I do not she has a high risk of getting hypoglycemia.  I also discussed our management plans with Lupita Leash, our diabetes educator, who agreed with the plan and will follow up with Ms. Caya telephonically to see how she is doing with the insulin titration.  PLAN:  Cont current meds Increase morning Lantus by 2 units every 3 days until majority of sugars are at goal

## 2019-06-19 NOTE — Assessment & Plan Note (Signed)
This problem is chronic and controlled.  We switched her metoprolol to succinate at her last appointment 100 mg and she remains on HCTZ 25 and olmesartan 40.  She has no side effects to these medications and her blood pressure is at goal.  PLAN:  Cont current meds   BP Readings from Last 3 Encounters:  06/15/19 116/63  04/25/19 120/80  04/05/19 119/76

## 2019-06-19 NOTE — Assessment & Plan Note (Signed)
This problem is chronic and stable.  She is on atorvastatin 20 mg a moderate intensity statin for primary prevention.  Her 10-year cardiovascular risk was 7.26.  We discussed increasing to a high intensity statin but elected to delay that as we are starting new medicines and titrating up other medicines.

## 2019-06-19 NOTE — Assessment & Plan Note (Signed)
This problem is chronic and uncontrolled.  At her last appointment, she discussed how the pandemic was increasing her stress.  Her stress level is still high and it is affecting her sleep.  She cannot calm down and that is resulting in poor sleep and not sleeping long enough.  Melatonin helps but in high doses.  Benadryl helps but leaves her hung over.  She gets episodes where she feels like her throat is closing, she cannot breathe, and she gets palpitations and she knows this is all due to stress and anxiety.  Her Xanax helps but she tries to use it infrequently.  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  She also journals to help with management of her symptoms.  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  She also assists with her father who lives with them.  We discussed treatment options and elected to do both counseling and pharmaceutical.  She wants a referral to [place] but prefers in person sessions.  We also went over the side effect profile of pharmaceutical antianxiety medicines and decided on Cymbalta, starting at 30 mg for a week and then increasing to 60 mg.  She had been on Effexor in the past which caused significant side effects.  PLAN : Referral to [facility] Duloxetine 30 mg for a week and then increasing to 60 mg

## 2019-06-21 MED FILL — UNIFINE PENTIPS 32GX5/32: 32G X 4 MM | 90 days supply | Qty: 200 | Fill #1

## 2019-06-26 ENCOUNTER — Ambulatory Visit: Payer: No Typology Code available for payment source | Admitting: Family Medicine

## 2019-06-26 ENCOUNTER — Other Ambulatory Visit: Payer: Self-pay

## 2019-06-26 ENCOUNTER — Encounter: Payer: Self-pay | Admitting: Family Medicine

## 2019-06-26 VITALS — BP 110/70 | Ht 65.0 in | Wt 240.0 lb

## 2019-06-26 DIAGNOSIS — M7542 Impingement syndrome of left shoulder: Secondary | ICD-10-CM | POA: Diagnosis not present

## 2019-06-26 NOTE — Progress Notes (Signed)
    SUBJECTIVE:   CHIEF COMPLAINT / HPI:   Left shoulder pain Patient reports that a few months ago, she gradually began to have left shoulder pain.  She cannot identify a trigger or change in activity that could have caused this pain.  She says that she noticed pain whenever she would reach overhead, forward, or behind her.  She went to the emergency department after she had numbness and tingling radiating from the left side of her neck to her left hand and forearm and was told that she likely has a trapezius strain.  She then followed up with her PCP, who referred her to [place].  She has tried courses of meloxicam and has just started Cymbalta.  She has not yet felt any relief and feels that her pain has worsened during the past few months.  She denies any current numbness and tingling of the left upper extremity.  PERTINENT  PMH / PSH: 2 diabetes mellitus, obesity, neurolyse anxiety disorder, reactive airway disease  OBJECTIVE:   BP 110/70   Ht 5\' 5"  (1.651 m)   Wt 240 lb (108.9 kg)   BMI 39.94 kg/m   General: well appearing, appears stated age, pleasant Shoulder, left: No evidence of bony deformity, asymmetry, or muscle atrophy; No tenderness over long head of biceps (bicipital groove). No TTP at [facility] joint. Restricted active and passive range of motion on extension and flexion, full ROM on internal and external rotation, Thumb unable to reach to T12 without significant tenderness. Strength 5/5 on internal rotation, abduction, adduction, flexion, and extension, 4/5 on shoulder abduction past 90 degrees and external rotation. No abnormal scapular function observed. Sensation intact. Peripheral pulses intact.  Special Tests:   - Empty can: POS   - Hawkins: POS   - Neer test: NEG   - Yergason's: NEG   - Speeds test: POS  ASSESSMENT/PLAN:   Impingement syndrome of left shoulder History and exam today are consistent with impingement syndrome of the left shoulder.  Symmetrical passive ROM on  external rotation makes frozen shoulder highly unlikely at this time.  Options were discussed with the patient, including home exercises, physical therapy, NSAIDs, and corticosteroid injection.  Advised against corticosteroid injection due to patient's diabetes, and patient was agreeable.  We will start with home exercise program and try Voltaren gel instead of meloxicam.  Exercises were demonstrated for the patient, and she was given a handout describing them.  If patient continues to have pain several weeks after doing home exercises regularly, we can refer her to formal PT.  Patient will follow up with [facility] in 4 weeks if needed.     [place], MD [facility]

## 2019-06-26 NOTE — Assessment & Plan Note (Addendum)
History and exam today are consistent with impingement syndrome of the left shoulder.  Symmetrical passive ROM on external rotation makes frozen shoulder highly unlikely at this time.  Options were discussed with the patient, including home exercises, physical therapy, NSAIDs, and corticosteroid injection.  Advised against corticosteroid injection due to patient's diabetes, and patient was agreeable.  We will start with home exercise program and try Voltaren gel instead of meloxicam.  Exercises were demonstrated for the patient, and she was given a handout describing them.  If patient continues to have pain several weeks after doing home exercises regularly, we can refer her to formal PT.  Patient will follow up with [place] in 4 weeks if needed.

## 2019-06-26 NOTE — Patient Instructions (Signed)
You have rotator cuff impingement Try to avoid painful activities (overhead activities, lifting with extended arm) as much as possible. Stop the meloxicam. Voltaren gel up to 4 times a day topically for pain and inflammation. If voltaren gel doesn't help enough, consider aleve 2 tabs twice a day with food for pain and inflammation. Can take tylenol in addition to this. Subacromial injection may be beneficial to help with pain and to decrease inflammation. Consider physical therapy with transition to home exercise program. Do home exercise program with theraband and scapular stabilization exercises daily 3 sets of 10 once a day. If not improving at follow-up we will consider further imaging, injection, physical therapy, and/or nitro patches. Follow up with me in 6 weeks but call me sooner if you want to do physical therapy.

## 2019-06-28 MED FILL — OLMESARTAN-HCTZ 20-12.5 MG: 20-12.5 | 30 days supply | Qty: 30 | Fill #4

## 2019-07-05 MED FILL — FREESTYLE LIBRE 2 SENSOR SY: 28 days supply | Qty: 2 | Fill #7

## 2019-07-07 MED FILL — OMEPRAZOLE 20 MG CAP: 20 | 90 days supply | Qty: 90 | Fill #1

## 2019-07-07 MED FILL — ATORVASTATIN 20 MG TABLET: 20 | 90 days supply | Qty: 90 | Fill #1

## 2019-07-10 ENCOUNTER — Telehealth: Payer: Self-pay | Admitting: Licensed Clinical Social Worker

## 2019-07-10 MED FILL — DULOXETINE HCL 60 MG CPEP: 60 | 30 days supply | Qty: 30 | Fill #1

## 2019-07-10 NOTE — Telephone Encounter (Signed)
Patient was called to discuss referral for services. Patient requested to be called back in one week to discuss scheduling.

## 2019-07-11 ENCOUNTER — Other Ambulatory Visit: Payer: Self-pay | Admitting: Internal Medicine

## 2019-07-11 ENCOUNTER — Telehealth: Payer: Self-pay | Admitting: *Deleted

## 2019-07-11 MED ORDER — BASAGLAR KWIKPEN 100 UNIT/ML ~~LOC~~ SOPN: 75.0000 [IU] | PEN_INJECTOR | Freq: Every day | SUBCUTANEOUS | 3 refills | Status: DC

## 2019-07-11 MED FILL — BASAGLAR 100 UNIT/ML KWIKPE: 100 | 40 days supply | Qty: 30 | Fill #0

## 2019-07-11 NOTE — Telephone Encounter (Signed)
Yes thanks 

## 2019-07-11 NOTE — Telephone Encounter (Signed)
Fax from [facility] - wants to know if Lantus glargine solostar pen can be changed to [employer] which is covered by pt's insurance? Please send new rx Thanks

## 2019-07-13 MED FILL — XULTOPHY 100 UNIT-3.6MG/ML: 100-3.6 | 30 days supply | Qty: 15 | Fill #10

## 2019-07-13 MED FILL — ALPRAZolam 0.5 MG TABS: 0.5 | 30 days supply | Qty: 30 | Fill #1

## 2019-07-18 ENCOUNTER — Other Ambulatory Visit: Payer: Self-pay | Admitting: Internal Medicine

## 2019-07-18 ENCOUNTER — Encounter: Payer: Self-pay | Admitting: Internal Medicine

## 2019-07-19 ENCOUNTER — Other Ambulatory Visit: Payer: Self-pay | Admitting: *Deleted

## 2019-07-19 MED ORDER — BETAMETHASONE DIPROPIONATE 0.05 % EX CREA: TOPICAL_CREAM | Freq: Two times a day (BID) | CUTANEOUS | 0 refills | Status: DC

## 2019-07-19 MED FILL — BETAMETHASONE DP 0.05% CRM: 0.05 | 15 days supply | Qty: 30 | Fill #0

## 2019-07-28 MED FILL — OLMESARTAN-HCTZ 20-12.5 MG: 20-12.5 | 30 days supply | Qty: 30 | Fill #5

## 2019-08-01 ENCOUNTER — Ambulatory Visit (INDEPENDENT_AMBULATORY_CARE_PROVIDER_SITE_OTHER): Payer: No Typology Code available for payment source | Admitting: Licensed Clinical Social Worker

## 2019-08-01 DIAGNOSIS — F411 Generalized anxiety disorder: Secondary | ICD-10-CM

## 2019-08-03 ENCOUNTER — Encounter: Payer: No Typology Code available for payment source | Admitting: Internal Medicine

## 2019-08-07 ENCOUNTER — Other Ambulatory Visit: Payer: Self-pay | Admitting: Family Medicine

## 2019-08-07 ENCOUNTER — Ambulatory Visit: Payer: Self-pay

## 2019-08-07 ENCOUNTER — Other Ambulatory Visit: Payer: Self-pay

## 2019-08-07 ENCOUNTER — Ambulatory Visit: Payer: No Typology Code available for payment source | Admitting: Family Medicine

## 2019-08-07 VITALS — BP 112/72 | Ht 65.0 in | Wt 240.0 lb

## 2019-08-07 DIAGNOSIS — M7542 Impingement syndrome of left shoulder: Secondary | ICD-10-CM

## 2019-08-07 MED ORDER — NITROGLYCERIN 0.2 MG/HR TD PT24: MEDICATED_PATCH | TRANSDERMAL | 1 refills | Status: DC

## 2019-08-07 MED FILL — NITROGLYCERIN 0.2 MG/HR PTC: 0.2 | 84 days supply | Qty: 21 | Fill #0

## 2019-08-07 NOTE — Patient Instructions (Addendum)
Your ultrasound is reassuring. You have rotator cuff tendinopathy. Continue with your home exercises. Send me a message when you find out the cost of therapy as I think physical therapy would help you a lot. Start nitro patches 1/4th patch to affected area, change daily. Icing as needed 15 minutes at a time. Voltaren gel up to 4 times a day topically for pain and inflammation. Follow up with me in 6 weeks.

## 2019-08-07 NOTE — Progress Notes (Signed)
    SUBJECTIVE:   CHIEF COMPLAINT / HPI:   Ms. Hulon presents for follow-up for left shoulder pain.  She has been doing home exercises and taking naproxen/diclofenac gel..  She reports some improvement, but feels that she has not made much progress.  Discussed treatment modalities.  Patient said that she likely cannot afford physical therapy but would look into it.  She currently does not want to do surgery.  OBJECTIVE:   BP 112/72   Ht 5\' 5"  (1.651 m)   Wt 240 lb (108.9 kg)   BMI 39.94 kg/m   Left shoulder Inspection: No swelling or bony abnormality Palpation: Mildly tender to palpation over the [facility] joint and anterior shoulder ROM: Slightly limited range of motion above 110 degrees of flexion, normal external/internal rotation.  Normal full passive ROM Strength: 5/5 strength throughout Stability: No sulcus sign Special tests: Positive empty can, pain with external rotation, positive [facility], negative crossarm, negative speeds, negative Yergason's Vascular studies:NVI  MSK u/s left shoulder:  Technically difficult study Biceps tendon: Visualized in long and short axis - appears thin with hypoechoic change.  No tenosynovitis Subscapularis: intact without apparent tears.  Mild hypoechoic change Pec major tendon: intact without tear AC joint with moderate arthropathy but no effusion Infraspinatus: no obvious tear but hypoechoic change Supraspinatus: no full thickness tear visualized.  Hypoechoic change within tendon.  No bursitis  ASSESSMENT/PLAN:   Rotator cuff tendinopathy As evidenced on ultrasound.  No definitive full-thickness tear. -Continue NSAIDs and diclofenac gel -1/4 Nitropatch every 24 hours - discussed risks of headache, skin irritation -Follow-up in 6 weeks Leanord Asal, MD [facility]

## 2019-08-08 ENCOUNTER — Encounter: Payer: Self-pay | Admitting: Family Medicine

## 2019-08-08 MED FILL — DULOXETINE HCL 60 MG CPEP: 60 | 30 days supply | Qty: 30 | Fill #2

## 2019-08-11 NOTE — BH Specialist Note (Signed)
Integrated Behavioral Health Initial Visit  MRN: [id number] Name: Kelly Gallagher  Number of Integrated Behavioral Health Clinician visits:: 1/6 Session Start time: 1;05  Session End time: 1:50 Total time: 45 minutes  Type of Service: Integrated Behavioral Health- Individual Interpretor:No.        SUBJECTIVE: Kelly Gallagher is a 49 y.o. female  whom attended the session individually. Patient was referred by Dr. Rogelia Boga for anxiety. Patient reports the following symptoms/concerns: difficulty with setting boundaries with family members, anxiety, and mild sadness. Duration of problem: increased over the past year; Severity of problem: mild  OBJECTIVE: Mood: Netural and Affect: Appropriate Risk of harm to self or others: No plan to harm self or others  LIFE CONTEXT: Family and Social: Patient reported that her father and adult sister live with her husband and herself. Patient identified that her family living with her is a trigger for her anxiety. Patient would like to gain back some autonomy and independence from her extended family members.  School/Work: Patient works. Self-Care: Goal to improve boundaries with others.   GOALS ADDRESSED: Patient will: 1. Reduce symptoms of: anxiety, depression and stress 2. Increase knowledge and/or ability of: coping skills, healthy habits and stress reduction  3. Demonstrate ability to: Increase healthy adjustment to current life circumstances and Increase adequate support systems for patient/family  INTERVENTIONS: Interventions utilized: Motivational Interviewing, Brief CBT and Supportive Counseling  Standardized Assessments completed: assessed for SI, HI, and self-harm.  ASSESSMENT: Patient currently experiencing mild to moderate levels of anxiety. Patient also reports periods of feeling down and overwhelmed. Patient started taking Cymbalta around six weeks ago, and reported this has provided an improvement with reducing her ruminations.  Patient is also getting around 5-7 hours of sleep since beginning the medication. Patient reported this is an improvement in her sleep hygiene.   Patient identified that she wants to begin saying "no" when needed. Patient identified that she struggles with conflict, and would like to be able to have more assertive dialogue as needed. Patient reported that she would like to begin "freeing herself of responsibilities that are not her own".    Patient may benefit from counseling.  PLAN: 1. Follow up with behavioral health clinician on : two to three weeks.   Lysle Rubens, [facility], LCAS

## 2019-08-12 ENCOUNTER — Other Ambulatory Visit: Payer: Self-pay | Admitting: Internal Medicine

## 2019-08-12 MED FILL — XULTOPHY 100 UNIT-3.6MG/ML: 100-3.6 | 30 days supply | Qty: 15 | Fill #11

## 2019-08-14 MED FILL — ALPRAZolam 0.5 MG TABS: 0.5 | 30 days supply | Qty: 30 | Fill #0

## 2019-08-15 ENCOUNTER — Encounter: Payer: Self-pay | Admitting: Licensed Clinical Social Worker

## 2019-08-22 MED FILL — INVOKAMET XR 150-1,000 MG T: 150-1000 | 90 days supply | Qty: 180 | Fill #3

## 2019-08-24 ENCOUNTER — Encounter: Payer: Self-pay | Admitting: Internal Medicine

## 2019-08-25 MED FILL — METOPROLOL SUCCINATE ER 100: 100 | 90 days supply | Qty: 90 | Fill #2

## 2019-08-29 ENCOUNTER — Other Ambulatory Visit: Payer: Self-pay | Admitting: Internal Medicine

## 2019-08-29 ENCOUNTER — Ambulatory Visit: Payer: No Typology Code available for payment source | Admitting: Licensed Clinical Social Worker

## 2019-08-29 DIAGNOSIS — Z1231 Encounter for screening mammogram for malignant neoplasm of breast: Secondary | ICD-10-CM

## 2019-09-04 MED FILL — FREESTYLE LIBRE 2 SENSOR SY: 28 days supply | Qty: 2 | Fill #9

## 2019-09-07 ENCOUNTER — Other Ambulatory Visit: Payer: Self-pay | Admitting: Internal Medicine

## 2019-09-07 ENCOUNTER — Other Ambulatory Visit: Payer: Self-pay

## 2019-09-07 DIAGNOSIS — N898 Other specified noninflammatory disorders of vagina: Secondary | ICD-10-CM

## 2019-09-07 MED ORDER — ESTRADIOL 0.1 MG/GM VA CREA: 1.0000 | TOPICAL_CREAM | VAGINAL | 3 refills | Status: DC

## 2019-09-07 MED FILL — DULOXETINE HCL 60 MG CPEP: 60 | 30 days supply | Qty: 30 | Fill #0

## 2019-09-07 MED FILL — ESTRADIOL 0.1 MG/GM CRM: 0.1 | 10 days supply | Qty: 43 | Fill #0

## 2019-09-11 ENCOUNTER — Other Ambulatory Visit: Payer: Self-pay | Admitting: Internal Medicine

## 2019-09-11 ENCOUNTER — Encounter: Payer: Self-pay | Admitting: Internal Medicine

## 2019-09-11 ENCOUNTER — Encounter: Payer: Self-pay | Admitting: *Deleted

## 2019-09-11 DIAGNOSIS — E1142 Type 2 diabetes mellitus with diabetic polyneuropathy: Secondary | ICD-10-CM

## 2019-09-11 MED FILL — ALPRAZolam 0.5 MG TABS: 0.5 | 30 days supply | Qty: 30 | Fill #1

## 2019-09-13 ENCOUNTER — Other Ambulatory Visit: Payer: Self-pay | Admitting: Internal Medicine

## 2019-09-13 DIAGNOSIS — E1142 Type 2 diabetes mellitus with diabetic polyneuropathy: Secondary | ICD-10-CM

## 2019-09-14 ENCOUNTER — Ambulatory Visit (INDEPENDENT_AMBULATORY_CARE_PROVIDER_SITE_OTHER): Payer: No Typology Code available for payment source | Admitting: Internal Medicine

## 2019-09-14 ENCOUNTER — Other Ambulatory Visit: Payer: Self-pay

## 2019-09-14 DIAGNOSIS — E1142 Type 2 diabetes mellitus with diabetic polyneuropathy: Secondary | ICD-10-CM | POA: Diagnosis not present

## 2019-09-14 DIAGNOSIS — Z794 Long term (current) use of insulin: Secondary | ICD-10-CM

## 2019-09-14 MED ORDER — UNIFINE PENTIPS 32G X 4 MM MISC: 1 refills | Status: DC

## 2019-09-14 MED ORDER — BETAMETHASONE DIPROPIONATE 0.05 % EX CREA: TOPICAL_CREAM | Freq: Two times a day (BID) | CUTANEOUS | 0 refills | Status: DC

## 2019-09-14 MED ORDER — BASAGLAR KWIKPEN 100 UNIT/ML ~~LOC~~ SOPN: PEN_INJECTOR | SUBCUTANEOUS | 3 refills | Status: DC

## 2019-09-14 MED ORDER — FREESTYLE LIBRE 2 READER SYSTM DEVI: 1.0000 | Freq: Four times a day (QID) | 0 refills | Status: DC

## 2019-09-14 MED ORDER — VICTOZA 18 MG/3ML ~~LOC~~ SOPN: 1.8000 mg | PEN_INJECTOR | Freq: Every day | SUBCUTANEOUS | 5 refills | Status: DC

## 2019-09-14 MED FILL — FREESTYLE LIBRE 2 READER SY: 20 days supply | Qty: 1 | Fill #0

## 2019-09-14 MED FILL — BASAGLAR 100 UNIT/ML KWIKPE: 100 | 30 days supply | Qty: 30 | Fill #0

## 2019-09-14 MED FILL — BETAMETHASONE DP 0.05% CRM: 0.05 | 10 days supply | Qty: 30 | Fill #0

## 2019-09-14 NOTE — Progress Notes (Signed)
  [facility] Internal Medicine Residency Telephone Encounter Continuity Care Appointment  HPI:   This telephone encounter was created for [name] on 09/14/2019 for the following purpose/cc diabetes medication change.  Patient had been on xultophy and the saving card had ran out.   She reports taking Basagalr 60 units in the morning, Xultophy 50 units in PM and the Invokamet. Denies any issues taking the medications. Reports that her CBGs have been around 111-132. Her Xultophy savings card ran out and she was not able to get another one. Discussed separating the GLP-1 and insulin in the afternoon. She is agreeable to this.    Past Medical History:  Past Medical History:  Diagnosis Date  . Diabetes mellitus without complication (HCC)   . Hypertension       ROS:  Constitutional: Negative for chills and fever.  Respiratory: Negative for shortness of breath.   Cardiovascular: Negative for chest pain and leg swelling.  Gastrointestinal: Negative for abdominal pain, nausea and vomiting.   Neurological: Negative for dizziness and headaches.    Assessment / Plan / Recommendations:   Please see A&P under problem oriented charting for assessment of the patient's acute and chronic medical conditions.   As always, pt is advised that if symptoms worsen or new symptoms arise, they should go to an urgent care facility or to to ER for further evaluation.   Consent and Medical Decision Making:   Patient discussed with Dr. Antony Contras  This is a telephone encounter between Kelly Gallagher and Kelly Gallagher on 09/14/2019 for diabetes management. The visit was conducted with the patient located at home and Kelly Gallagher at [facility]. The patient's identity was confirmed using their DOB and current address. The patient has consented to being evaluated through a telephone encounter and understands the associated risks (an examination cannot be done and the patient may need to come in for an  appointment) / benefits (allows the patient to remain at home, decreasing exposure to coronavirus). I personally spent 10 minutes on medical discussion.

## 2019-09-15 MED FILL — ESTRADIOL 0.1 MG/GM CREA: 0.1 | 25 days supply | Qty: 43 | Fill #1

## 2019-09-15 NOTE — Progress Notes (Signed)
Internal Medicine Clinic Attending  Case discussed with Dr. Krienke at the time of the visit.  We reviewed the resident's history and exam and pertinent patient test results.  I agree with the assessment, diagnosis, and plan of care documented in the resident's note.    

## 2019-09-15 NOTE — Assessment & Plan Note (Signed)
She reports taking Basagalr 60 units in the morning, Xultophy 50 units in PM and the Invokamet. Denies any issues taking the medications. Reports that her CBGs have been around 111-132. Last A1c was 7.9. Her Xultophy savings card ran out and she was not able to get another one. Discussed seperating the GLP-1 and insulin in the afternoon. She is agreeable to this.   Continue Basaglar 60 units in AM Discontinue Xultophy Start basaglar 40 units in PM (20% reduction from the xultophy dose) Start victoza 1.8 mg daily Continue Invokamet RTC to see PCP on next available appointment

## 2019-09-18 ENCOUNTER — Encounter: Payer: Self-pay | Admitting: Family Medicine

## 2019-09-18 ENCOUNTER — Ambulatory Visit: Payer: No Typology Code available for payment source | Admitting: Family Medicine

## 2019-09-18 ENCOUNTER — Other Ambulatory Visit: Payer: Self-pay

## 2019-09-18 DIAGNOSIS — M67912 Unspecified disorder of synovium and tendon, left shoulder: Secondary | ICD-10-CM | POA: Insufficient documentation

## 2019-09-18 NOTE — Patient Instructions (Signed)
You have rotator cuff tendinopathy. Continue with your home exercises. Consider increasing nitro patches to 1/2 patch and change daily. Icing as needed 15 minutes at a time. Voltaren gel up to 4 times a day topically for pain and inflammation if needed. Call me if you want [place] to order physical therapy otherwise follow up in 6 weeks.

## 2019-09-18 NOTE — Progress Notes (Signed)
   Kelly Gallagher is a 49 y.o. female who presents to [facility] today for the following:  Left shoulder pain  Recently seen on 5/17 for rotator cuff tendinopathy.  At that time ultrasound was obtained showing tendinopathy without definitive full-thickness tear.  Advised to continue NSAIDs and voltaren gel.  Advised to use nitroglycerin patches and follow-up in 6 weeks.  Patient reports that since last follow-up she is doing much better.  States that she is about 25% back to baseline.  States that she has significantly improved overhead and outward arm motion.  Is using the nitroglycerin and Voltaren gel daily which seems to help.  Is also using the band stretching exercises which helps.  She states sometimes she feels pain around the [facility] joint but usually when that happens she puts nitroglycerin patch directly on the area which helps.  PMH reviewed. HTN, T2DM, obesity ROS as above. Medications reviewed.  Exam:  BP 108/62   Ht 5\' 5"  (1.651 m)   Wt 240 lb (108.9 kg)   BMI 39.94 kg/m  Gen: Well NAD  MSK: Shoulder: Inspection reveals no obvious deformity, atrophy, or asymmetry. No bruising. No swelling Palpation is normal with no TTP over [facility] joint or bicipital groove. Limited ROM in flexion, abduction, external rotation. Full passive ROM  NV intact distally Normal scapular function observed. Special Tests:  - Impingement: Pos Hawkins, Pos neers, Pos empty can sign. - Supraspinatous: Pos empty can.  5/5 strength with resisted flexion at 20 degrees - Infraspinatous/Teres Minor: 5/5 strength with ER - Subscapularis: 5/5 strength with IR - Labrum: Negative Obriens  - No painful arc and no drop arm sign  Assessment and Plan: 1) Tendinopathy of left rotator cuff Patient with improved pain in left shoulder and improve range of motion.  Advised to continue nitroglycerin patches, can increase to half a patch a day.  Follow-up in 6 weeks.  Can consider physical therapy if no improvement.   [facility], PGY-3 [facility] Family Medicine Resident  09/18/2019 4:16 PM

## 2019-09-18 NOTE — Assessment & Plan Note (Signed)
Patient with improved pain in left shoulder and improve range of motion.  Advised to continue nitroglycerin patches, can increase to half a patch a day.  Follow-up in 6 weeks.  Can consider physical therapy if no improvement.

## 2019-09-19 ENCOUNTER — Ambulatory Visit (INDEPENDENT_AMBULATORY_CARE_PROVIDER_SITE_OTHER): Payer: No Typology Code available for payment source | Admitting: Licensed Clinical Social Worker

## 2019-09-19 ENCOUNTER — Encounter: Payer: Self-pay | Admitting: Licensed Clinical Social Worker

## 2019-09-19 ENCOUNTER — Other Ambulatory Visit: Payer: Self-pay | Admitting: Internal Medicine

## 2019-09-19 DIAGNOSIS — F411 Generalized anxiety disorder: Secondary | ICD-10-CM

## 2019-09-19 MED FILL — UNIFINE PENTIPS 32GX5/32: 32G X 4 MM | 90 days supply | Qty: 200 | Fill #0

## 2019-09-19 NOTE — Telephone Encounter (Signed)
Refill for betamethasone declined. This was just filled 5 days ago. I am not even sure what he is using this for. Please have him schedule an appointment if he needs further refills.

## 2019-09-19 NOTE — BH Specialist Note (Signed)
Integrated Behavioral Health Follow Up Visit  MRN: [id number] Name: Kelly Gallagher  Number of Integrated Behavioral Health Clinician visits: 2/6 Session Start time: 8:45  Session End time: 9:25 Total time: 40  minutes  Type of Service: Integrated Behavioral Health- Individual Interpretor:No.  SUBJECTIVE: Kelly Gallagher is a 49 y.o. female  whom attended the session indvidiually. Patient was referred by Dr. Rogelia Boga for anxiety. Patient reports the following symptoms/concerns:  difficulty with setting boundaries with family members, anxiety, and mild sadness. Mild sleep disturbances and panic symptoms. Duration of problem: increased over the past year; Severity of problem: moderate  OBJECTIVE: Mood: Anxious and Affect: Appropriate Risk of harm to self or others: No plan to harm self or others  LIFE CONTEXT: Family and Social: No changes in the patient's living situation. Patient reported that she is noticing memory deficiencies with her father, and that is raising concerns for the patient.  School/Work: Positive work environment. 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  Self-Care: Needs improvement. Patient identified journaling as a healthy coping skill. Patient acknowledged that she can benefit from taking more time for herself.  Life Changes: None reported.   GOALS ADDRESSED: Patient will: 1.  Reduce symptoms of: anxiety, depression, insomnia and stress  2.  Increase knowledge and/or ability of: coping skills, healthy habits, self-management skills and stress reduction  3.  Demonstrate ability to: Increase healthy adjustment to current life circumstances and Increase adequate support systems for patient/family  INTERVENTIONS: Interventions utilized:  Motivational Interviewing, Mindfulness or Management consultant, Brief CBT and Supportive Counseling Standardized Assessments completed: Not Needed  ASSESSMENT: Patient currently experiencing  moderate levels of generalized anxiety. Since our previous session, the patient has added "talk space" to her list of providers, and she is engaging in counseling through this platform as well. Patient reported this is a helpful tool to reduce her anxiety. Patient is currently working to identify areas of her life that need acceptance, and room for change. Patient acknowledged that boundaries are necessary, and she must begin putting herself as a priority.   Patient may benefit from counseling.  PLAN: 1. Follow up with behavioral health clinician on : one month per patient's request.   Lysle Rubens, [facility], LCAS

## 2019-09-26 ENCOUNTER — Telehealth: Payer: Self-pay | Admitting: Internal Medicine

## 2019-09-26 MED FILL — OLMESARTAN-HCTZ 20-12.5 MG: 20-12.5 | 30 days supply | Qty: 30 | Fill #7

## 2019-09-26 NOTE — Telephone Encounter (Signed)
Pls contact pt regarding medicine [phone] after 12

## 2019-09-27 ENCOUNTER — Encounter: Payer: Self-pay | Admitting: Internal Medicine

## 2019-09-28 ENCOUNTER — Ambulatory Visit
Admission: RE | Admit: 2019-09-28 | Discharge: 2019-09-28 | Disposition: A | Discharge status: Home / Self Care | Payer: No Typology Code available for payment source | Source: Ambulatory Visit | Attending: Sports Medicine | Admitting: Sports Medicine

## 2019-09-28 ENCOUNTER — Ambulatory Visit: Payer: No Typology Code available for payment source | Admitting: Sports Medicine

## 2019-09-28 ENCOUNTER — Other Ambulatory Visit: Payer: Self-pay

## 2019-09-28 VITALS — BP 110/62 | Ht 65.0 in | Wt 240.0 lb

## 2019-09-28 DIAGNOSIS — S060X9A Concussion with loss of consciousness of unspecified duration, initial encounter: Secondary | ICD-10-CM

## 2019-09-28 DIAGNOSIS — M67912 Unspecified disorder of synovium and tendon, left shoulder: Secondary | ICD-10-CM | POA: Diagnosis not present

## 2019-09-28 MED FILL — FREESTYLE LIBRE 2 SENSOR SY: 28 days supply | Qty: 2 | Fill #10

## 2019-09-28 NOTE — Progress Notes (Addendum)
   Subjective:    Patient ID: Kelly Gallagher, female    DOB: Jun 25, 1970, 49 y.o.   MRN: [id number]  HPI chief complaint: Left shoulder pain  Very pleasant 49 year old female comes in today complaining of left shoulder pain that began on Saturday when she fell out of her bed landing on her left shoulder and striking her head against the night table.  She has a history of left shoulder rotator cuff tendinopathy diagnosed via ultrasound.  She was slowly improving until her injury 2 days ago.  Now her pain is worse.  She has difficulty lifting her left arm.  She is also complaining of some nausea and dizziness since striking her head on the nightstand.  She is also complaining of a headache.  Some problems with balance as well.  She does think that she blacked out for a couple of seconds after striking her head.  She discontinued her topical nitroglycerin patches when she developed her headache.    Review of Systems    As above Objective:   Physical Exam  Well-developed, well-nourished.  No acute distress.  HEENT: Patient has ecchymosis around the left eye.  Orbit is intact.  Extraocular movement is intact.  No palpable crepitus.  Left shoulder: Patient has limited range of motion secondary to pain.  4/5 strength with both resisted supraspinatus and external rotation.  Neurovascularly intact distally.  Concussion testing was deferred due to patient's nausea.  X-rays of the left shoulder show nothing acute      Assessment & Plan:   Left shoulder pain-rule out rotator cuff tear Probable concussion  MRI of the left shoulder specifically to rule out a rotator cuff tear which may need operative repair.  Although extensive concussion testing was not done due to her symptoms in the office today, she has a concussion based on history and subjective findings.  I've given her a note excusing her from work for the next week and she is instructed to avoid computer and television screen time as much as  possible to allow her concussion to resolve.  She will follow up with me in the office in 1 week for reevaluation.  I explained to both her and her husband that concussions on average take 2 weeks to resolve.  She will limit her physical activity but she understands that I do not want her to be completely inactive.  Call with questions or concerns prior to her follow-up visit.

## 2019-10-02 MED FILL — DULOXETINE HCL 60 MG CPEP: 60 | 30 days supply | Qty: 30 | Fill #1

## 2019-10-03 ENCOUNTER — Encounter: Payer: Self-pay | Admitting: Internal Medicine

## 2019-10-05 ENCOUNTER — Other Ambulatory Visit: Payer: Self-pay

## 2019-10-05 ENCOUNTER — Ambulatory Visit: Payer: No Typology Code available for payment source | Admitting: Sports Medicine

## 2019-10-05 VITALS — BP 110/78 | Ht 65.0 in | Wt 240.0 lb

## 2019-10-05 DIAGNOSIS — M25512 Pain in left shoulder: Secondary | ICD-10-CM

## 2019-10-05 DIAGNOSIS — S060X0D Concussion without loss of consciousness, subsequent encounter: Secondary | ICD-10-CM

## 2019-10-05 MED FILL — OMEPRAZOLE 20 MG CAP: 20 | 90 days supply | Qty: 90 | Fill #2

## 2019-10-05 MED FILL — ATORVASTATIN 20 MG TABLET: 20 | 90 days supply | Qty: 90 | Fill #2

## 2019-10-05 NOTE — Progress Notes (Addendum)
Office Visit Note   Patient: Kelly Gallagher           Date of Birth: 11/14/1970           MRN: [id number] Visit Date: 10/05/2019 Requested by: Reymundo Poll, MD [address] [place],  [place] [address] PCP: Reymundo Poll, MD  Subjective: No chief complaint on file.   HPI: Patient is a 49 year old female presenting to clinic 1 week following a fall from bed from which she sustained a concussion as well as a direct blow to her left shoulder.  Since her initial appointment patient states that overall her concussion symptoms have improved.  She does state that she continues to get near daily headaches, however they tend to happen after she has been walking her dog or otherwise been "pushing herself too hard" throughout the day.  She also feels as though her emotions have been somewhat more labile than usual, stating that she has had several outbursts of crying as well as being angry with her family than she typically gets.  In addition she states she will occasionally have trouble falling asleep at night due to the left shoulder pain.  She is pending an MRI for her left shoulder which is scheduled for 1 August which was the soonest available.  Otherwise, patient remains out of work and is trying to adhere to a less active lifestyle as recommended.  She continues with low impact pool exercises, which she states do not aggravate her headache or her other symptoms.  Overall, she does feel as though she is slowly improving.  Of note, patient is concerned that her average daily blood sugars have somewhat increased since her initial fall.  She sent a message to her PCM about this concern, however has not yet heard a response.  She follows her blood sugars with a continuous glucose monitor, and states that her blood sugars have increased on average of about 100 since her concussion.  She is concerned that this could be indicative of deeper brain damage and is seeking reassurance.  She denies any changes to  her diet or diabetes regimen in this time.              ROS:   All other systems were reviewed and are negative.   Objective: Vital Signs: BP 110/78   Ht 5\' 5"  (1.651 m)   Wt 240 lb (108.9 kg)   BMI 39.94 kg/m   Physical Exam:  General:  Alert and oriented, in no acute distress. Pulm:  Breathing unlabored. Psy:  Normal mood, congruent affect. Skin:  Improving ecchymosis beneath her left eye. No obvious bruising over L shoulder. No obvious deformity appreciated. No rashes.   Neuro: CN II-XII intact bilaterally. PERRLA, OMI. Gait with normal balance.  MSK: LEFT SHOULDER with no obvious deformity. Pain with Abduction, Active ROM limited to 90* 2/2 pain. Significant pain/weakness with empty can. Pain with O'Brien/speeds. Pain with hawkins. TTP along [place] [facility], subacromial area, trapezius, supraspinatus region.   Imaging: No results found.  Assessment & Plan: 49 year old female presenting to clinic 1 week following a fall from her bed from which she sustained a question and direct blow to her left shoulder.  Overall patient states that she has been slowly improving though daily headaches do remain.  We discussed continuing with light exercise that does not cause her headaches to worsen, such as her pool exercise which she states she tolerates very well.  Given her continued headaches, suspect she would benefit  from another week of rest before returning to work.  She will return to clinic in 1 week for reassessment, if at that time her symptoms have not resolved recommend neurocognitive rehab.  Patient is agreeable to this plan.  Patient did express some concerns about her blood sugars, doubt this is related to her concussive incident, possibly due to decreased activity in the setting of injury.  Recommend she follow-up with her PCM for further guidance.  Patient expresses agreement.  Per her shoulder, patient is pending an MRI for possible rotator cuff injury in setting of chronic shoulder  impingement.  We will await the results of this study to guide further management.  Patient had no further questions or concerns.     Patient seen and evaluated with the sports medicine fellow.  I agree with the above plan of care.  Patient is slightly improved from her concussion.  I did discuss the possibility of neurocognitive PT but she would like to wait on that for now.  She will follow up with me again next week and will remain out of work in the interim.        PMFS History: Patient Active Problem List   Diagnosis Date Noted  . Tendinopathy of left rotator cuff 09/18/2019  . Impingement syndrome of left shoulder 06/26/2019  . Vaginal candidiasis 04/25/2019  . Musculoskeletal pain 04/29/2018  . Healthcare maintenance 04/29/2018  . DM (diabetes mellitus) type II controlled, 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HCC) 04/28/2018  . Hypertension 04/28/2018  . Generalized anxiety disorder 04/28/2018  . Hyperlipidemia 04/28/2018  . Morbid obesity (HCC) 04/28/2018  . IUD (intrauterine device) in place 04/28/2018  . GERD (gastroesophageal reflux disease) 04/28/2018  . Reactive airway disease 04/28/2018  . Vitamin B12 deficiency 04/28/2018   Past Medical History:  Diagnosis Date  . Diabetes mellitus without complication (HCC)   . Hypertension     Family History  Problem Relation Age of Onset  . Cancer Father        Head and neck cancer. Has PEG  . Lupus Sister   . Breast cancer Other   . Breast cancer Other     Past Surgical History:  Procedure Laterality Date  . CHOLECYSTECTOMY    . DILATION AND CURETTAGE OF UTERUS     Social History   Occupational History  . Not on file  Tobacco Use  . Smoking status: Never Smoker  . Smokeless tobacco: Never Used  . Tobacco comment: lives with smokers   Vaping Use  . Vaping Use: Never used  Substance and Sexual Activity  . Alcohol use: Not Currently    Comment: Rarely.  . Drug use: Not on file  . Sexual activity: Not on file

## 2019-10-06 ENCOUNTER — Telehealth: Payer: Self-pay

## 2019-10-06 NOTE — Telephone Encounter (Signed)
RTC from patient, states she missed the call from [facility].  She was notified of Dr. Jule Ser instructions she should f/u in clinic to address her concerns. Pt agreeable and transferred to front desk for scheduling.  Appt was made for 10/09/19 w/ Dr. Chesley Mires. SChaplin, RN,BSN

## 2019-10-06 NOTE — Telephone Encounter (Signed)
Hi Cristan Scherzer,   I am sorry I have been in the hospital with my daughter this week and fell behind with my inbox. I will be back at work on Monday. Can you please let Mr. Rask know I have been out of the office and ask him to schedule an [facility] visit for these concerns?   Thank you!   Dr. Antony Contras    Message text   You routed conversation to Reymundo Poll, MD 2 days ago  Dorann Lodge, MD 3 days ago  JD Hello Dr Antony Contras and /or others, In speaking with an RN at [facility], she suggested I follow up with you on the following regarding my injury on 7/3.   Due to questionable loss in f consciousness resulting from above mentioned fall, there has been no CT or MRI done. My blood sugars have been elevated despite med adherence. For examples attached here is a picture of my daily graph from my Freestyle [place] 2 monitor.  Please advise. I do have a follow up appointment with Dr Margaretha Sheffield on Thursday at 8:45.   Thank you.  Augusto Gamble

## 2019-10-06 NOTE — Telephone Encounter (Signed)
TC to patient to relay Dr. Jule Ser instructions that patient needs to be seen in clinic.  Phone rings once and stops, RN unable to get in touch with patient.  2 attempts made, will send patient a message in my chart. SChaplin, RN,BSN

## 2019-10-09 ENCOUNTER — Other Ambulatory Visit: Payer: Self-pay

## 2019-10-09 ENCOUNTER — Telehealth: Payer: Self-pay | Admitting: *Deleted

## 2019-10-09 ENCOUNTER — Encounter: Payer: Self-pay | Admitting: Internal Medicine

## 2019-10-09 ENCOUNTER — Ambulatory Visit (INDEPENDENT_AMBULATORY_CARE_PROVIDER_SITE_OTHER): Payer: No Typology Code available for payment source | Admitting: Internal Medicine

## 2019-10-09 DIAGNOSIS — E1142 Type 2 diabetes mellitus with diabetic polyneuropathy: Secondary | ICD-10-CM

## 2019-10-09 DIAGNOSIS — Z794 Long term (current) use of insulin: Secondary | ICD-10-CM

## 2019-10-09 DIAGNOSIS — E1149 Type 2 diabetes mellitus with other diabetic neurological complication: Secondary | ICD-10-CM | POA: Diagnosis not present

## 2019-10-09 MED ORDER — FREESTYLE LIBRE 2 SENSOR MISC: 1.0000 "application " | Freq: Four times a day (QID) | 2 refills | Status: DC

## 2019-10-09 MED ORDER — OZEMPIC (0.25 OR 0.5 MG/DOSE) 2 MG/1.5ML ~~LOC~~ SOPN: 0.2500 mg | PEN_INJECTOR | SUBCUTANEOUS | 0 refills | Status: DC

## 2019-10-09 MED ORDER — OZEMPIC (0.25 OR 0.5 MG/DOSE) 2 MG/1.5ML ~~LOC~~ SOPN: 0.5000 mg | PEN_INJECTOR | SUBCUTANEOUS | 2 refills | Status: DC

## 2019-10-09 MED FILL — BASAGLAR 100 UNIT/ML KWIKPE: 100 | 30 days supply | Qty: 30 | Fill #1

## 2019-10-09 MED FILL — OZEMPIC 0.25 OR 0.5 MG/DOSE: 2 | 44 days supply | Qty: 2 | Fill #0

## 2019-10-09 NOTE — Patient Instructions (Signed)
Ms. Daniel, It was nice meeting you! Today we discussed your blood sugars which are most likely elevated due to medication error like you mentioned.   For the insurance issues, I have sent in Ozempic to take instead of Victoza. You will start out with 0.25 mg weekly for 4 weeks. Then increase to 0.5 mg weekly.   I hope you continue to recover from your concussion symptoms.   Take care, Dr. Chesley Mires

## 2019-10-09 NOTE — Assessment & Plan Note (Signed)
Patient presents due to concern for more elevated blood sugars than normal over the past 2 weeks. She discovered today that she had only been taking half of the prescribed dose of her Invokamet. She normally puts her medications in pill boxes, but was recently concussed 2 weeks ago from a fall out of bed and mistakenly did not put the correct dose of her Invokamet in the pill box.  On review of her CGM, her average has still been around 180, and she primarily stayed below 250 apart from a couple of readings. She has not had any symptoms, other than headaches and intermittent nausea related to her concussion. She also requests that we transition her to Ozempic instead of Victoza for insurance approval.   Continue current diabetes regimen, except GLP1 change. Will discontinue Victoza. New prescription for Ozempic 0.25 mg q weekly for 4 weeks, followed by 0.5 mg weekly thereafter.

## 2019-10-09 NOTE — Telephone Encounter (Signed)
Pharmacist from [facility] called to clarify Rxs sent for Ozempic. Per Dr. Chesley Mires, patient is to take 0.25 mg for 4 weeks then increase to 0.5 mg. Pharmacist notified. Kinnie Feil, BSN, RN-BC

## 2019-10-09 NOTE — Progress Notes (Signed)
Acute Office Visit  Subjective:    Patient ID: Kelly Gallagher, female    DOB: 03-10-1971, 49 y.o.   MRN: [id number]  Chief Complaint  Patient presents with  . Hyperglycemia  . Medication Problem    HPI Patient is in today for concern of recent hyperglycemia over the last 2 weeks. Please see problem based charting for further details.   Past Medical History:  Diagnosis Date  . Diabetes mellitus without complication (HCC)   . Hypertension     Past Surgical History:  Procedure Laterality Date  . CHOLECYSTECTOMY    . DILATION AND CURETTAGE OF UTERUS      Family History  Problem Relation Age of Onset  . Cancer Father        Head and neck cancer. Has PEG  . Lupus Sister   . Breast cancer Other   . Breast cancer Other     Social History   Socioeconomic History  . Marital status: Married    Spouse name: Not on file  . Number of children: Not on file  . Years of education: Not on file  . Highest education level: Not on file  Occupational History  . Not on file  Tobacco Use  . Smoking status: Never Smoker  . Smokeless tobacco: Never Used  . Tobacco comment: lives with smokers   Vaping Use  . Vaping Use: Never used  Substance and Sexual Activity  . Alcohol use: Not Currently    Comment: Rarely.  . Drug use: Not on file  . Sexual activity: Not on file  Other Topics Concern  . Not on file  Social History Narrative  . Not on file   Social Determinants of Health   Financial Resource Strain:   . Difficulty of Paying Living Expenses:   Food Insecurity:   . Worried About Programme researcher, broadcasting/film/video in the Last Year:   . Barista in the Last Year:   Transportation Needs:   . Freight forwarder (Medical):   Marland Kitchen Lack of Transportation (Non-Medical):   Physical Activity:   . Days of Exercise per Week:   . Minutes of Exercise per Session:   Stress:   . Feeling of Stress :   Social Connections:   . Frequency of Communication with Friends and Family:   .  Frequency of Social Gatherings with Friends and Family:   . Attends Religious Services:   . Active Member of Clubs or Organizations:   . Attends Banker Meetings:   Marland Kitchen Marital Status:   Intimate Partner Violence:   . Fear of Current or Ex-Partner:   . Emotionally Abused:   Marland Kitchen Physically Abused:   . Sexually Abused:     Outpatient Medications Prior to Visit  Medication Sig Dispense Refill  . Albuterol Sulfate 108 (90 Base) MCG/ACT AEPB Inhale 2 puffs into the lungs every 4 (four) hours as needed. 1 each 4  . ALPRAZolam (XANAX) 0.5 MG tablet TAKE 1 TABLET BY MOUTH DAILY AS NEEDED FOR ANXIETY. 30 tablet 1  . atorvastatin (LIPITOR) 20 MG tablet TAKE 1 TABLET BY MOUTH ONCE A DAY 90 tablet 3  . betamethasone dipropionate 0.05 % cream Apply topically 2 (two) times daily. 30 g 0  . Canagliflozin-metFORMIN HCl ER (INVOKAMET XR) [phone] MG TB24 Take 1 tablet by mouth 2 (two) times daily with a meal. 180 tablet 3  . Continuous Blood Gluc Receiver (FREESTYLE LIBRE 2 READER SYSTM) DEVI 1 each by Does not  apply route 4 (four) times daily. 1 each 0  . DULoxetine (CYMBALTA) 60 MG capsule Take 1 capsule (60 mg total) by mouth daily. 30 capsule 2  . estradiol (ESTRACE VAGINAL) 0.1 MG/GM vaginal cream Place 1 Applicatorful vaginally 3 (three) times a week. Initially use nightly for 2 weeks. 42.5 g 3  . fluticasone (FLONASE) 50 MCG/ACT nasal spray Place 2 sprays into both nostrils daily for 10 days. 16 g 0  . Insulin Glargine (BASAGLAR KWIKPEN) 100 UNIT/ML Inject 0.6 mLs (60 Units total) into the skin daily AND 0.4 mLs (40 Units total) at bedtime. 30 mL 3  . Insulin Pen Needle (UNIFINE PENTIPS) 32G X 4 MM MISC Use to inject insulin into the skin 2 times daily 200 each 1  . lidocaine (LIDODERM) 5 % Place 1 patch onto the skin daily. Remove & Discard patch within 12 hours or as directed by MD 10 patch 0  . metoprolol succinate (TOPROL XL) 100 MG 24 hr tablet Take 1 tablet (100 mg total) by mouth  daily. Take with or immediately following a meal. (Patient not taking: Reported on 12/09/2018) 90 tablet 3  . Multiple Vitamin (MULTIVITAMIN) tablet Take 1 tablet by mouth daily.    . nitroGLYCERIN (NITRODUR - DOSED IN MG/24 HR) 0.2 mg/hr patch Apply 1/4th patch to affected shoulder, change daily 30 patch 1  . olmesartan-hydrochlorothiazide (BENICAR HCT) 20-12.5 MG tablet Take 1 tablet by mouth daily. 180 tablet 3  . Omega-3 Fatty Acids (FISH OIL) 1000 MG CAPS Take by mouth.    Marland Kitchen omeprazole (PRILOSEC) 20 MG capsule TAKE 1 CAPSULE BY MOUTH ONCE A DAY 90 capsule 3  . Probiotic Product (PROBIOTIC-10 PO) Take by mouth.    . vitamin B-12 (CYANOCOBALAMIN) 1000 MCG tablet Take by mouth.    . Continuous Blood Gluc Sensor (FREESTYLE LIBRE 2 SENSOR SYSTM) MISC 1 each by Does not apply route 4 (four) times daily. 2 each 11  . liraglutide (VICTOZA) 18 MG/3ML SOPN Inject 0.3 mLs (1.8 mg total) into the skin daily. 4 pen 5   No facility-administered medications prior to visit.    Allergies  Allergen Reactions  . Ace Inhibitors Anaphylaxis  . Effexor Xr [Venlafaxine Hcl]     Patient states caused symptomatic elevated serotonin levels    Review of Systems  Constitutional: Negative for chills, fatigue and fever.  Respiratory: Negative for shortness of breath.   Cardiovascular: Negative for chest pain.  Gastrointestinal: Negative for nausea.  Endocrine: Negative for polydipsia, polyphagia and polyuria.       Objective:    Physical Exam Constitutional:      General: She is not in acute distress.    Appearance: Normal appearance.  Cardiovascular:     Rate and Rhythm: Normal rate and regular rhythm.  Pulmonary:     Effort: Pulmonary effort is normal.     Breath sounds: Normal breath sounds.  Musculoskeletal:     Right lower leg: No edema.     Left lower leg: No edema.  Neurological:     Mental Status: She is alert.  Psychiatric:        Mood and Affect: Mood normal.        Behavior: Behavior  normal.     BP 116/73 (BP Location: Right Arm, Patient Position: Sitting, Cuff Size: Large)   Pulse 72   Temp 98.3 F (36.8 C) (Oral)   Ht 5\' 5"  (1.651 m)   Wt 251 lb 4.8 oz (114 kg)   LMP 10/04/2019  SpO2 97% Comment: room air  BMI 41.82 kg/m  Wt Readings from Last 3 Encounters:  10/09/19 251 lb 4.8 oz (114 kg)  10/05/19 240 lb (108.9 kg)  09/28/19 240 lb (108.9 kg)    Health Maintenance Due  Topic Date Due  . Hepatitis C Screening  Never done  . COVID-19 Vaccine (1) Never done  . HIV Screening  Never done  . TETANUS/TDAP  Never done    There are no preventive care reminders to display for this patient.   No results found for: TSH Lab Results  Component Value Date   WBC 10.5 04/04/2019   HGB 14.9 04/04/2019   HCT 47.6 (H) 04/04/2019   MCV 87.3 04/04/2019   PLT 297 04/04/2019   Lab Results  Component Value Date   NA 134 (L) 04/04/2019   K 3.9 04/04/2019   CO2 26 04/04/2019   GLUCOSE 197 (H) 04/04/2019   BUN 18 04/04/2019   CREATININE 0.74 04/04/2019   CALCIUM 9.3 04/04/2019   ANIONGAP 10 04/04/2019   No results found for: CHOL No results found for: HDL No results found for: LDLCALC No results found for: TRIG No results found for: CHOLHDL Lab Results  Component Value Date   HGBA1C 7.9 (A) 06/15/2019       Assessment & Plan:   Problem List Items Addressed This Visit      Endocrine   DM (diabetes mellitus) type II controlled, 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HCC) (Chronic)    Patient presents due to concern for more elevated blood sugars than normal over the past 2 weeks. She discovered today that she had only been taking half of the prescribed dose of her Invokamet. She normally puts her medications in pill boxes, but was recently concussed 2 weeks ago from a fall out of bed and mistakenly did not put the correct dose of her Invokamet in the pill box.  On review of her CGM, her average has still been around 180, and she primarily stayed below 250 apart  from a couple of readings. She has not had any symptoms, other than headaches and intermittent nausea related to her concussion. She also requests that we transition her to Ozempic instead of Victoza for insurance approval.   Continue current diabetes regimen, except GLP1 change. Will discontinue Victoza. New prescription for Ozempic 0.25 mg q weekly for 4 weeks, followed by 0.5 mg weekly thereafter.       Relevant Medications   Semaglutide,0.25 or 0.5MG /DOS, (OZEMPIC, 0.25 OR 0.5 MG/DOSE,) 2 MG/1.5ML SOPN   Semaglutide,0.25 or 0.5MG /DOS, (OZEMPIC, 0.25 OR 0.5 MG/DOSE,) 2 MG/1.5ML SOPN       Meds ordered this encounter  Medications  . Semaglutide,0.25 or 0.5MG /DOS, (OZEMPIC, 0.25 OR 0.5 MG/DOSE,) 2 MG/1.5ML SOPN    Sig: Inject 0.1875 mLs (0.25 mg total) into the skin once a week.    Dispense:  1.5 mL    Refill:  0  . Continuous Blood Gluc Sensor (FREESTYLE LIBRE 2 SENSOR) MISC    Sig: 1 application by Does not apply route 4 (four) times daily.    Dispense:  1 each    Refill:  2  . Semaglutide,0.25 or 0.5MG /DOS, (OZEMPIC, 0.25 OR 0.5 MG/DOSE,) 2 MG/1.5ML SOPN    Sig: Inject 0.375 mLs (0.5 mg total) into the skin once a week.    Dispense:  1.5 mL    Refill:  2     Kelly Kolton D Stephanie Littman, DO

## 2019-10-10 NOTE — Progress Notes (Signed)
Internal Medicine Clinic Attending  Case discussed with Dr. Bloomfield  At the time of the visit.  We reviewed the resident's history and exam and pertinent patient test results.  I agree with the assessment, diagnosis, and plan of care documented in the resident's note.  

## 2019-10-12 ENCOUNTER — Ambulatory Visit: Payer: No Typology Code available for payment source | Admitting: Sports Medicine

## 2019-10-12 ENCOUNTER — Other Ambulatory Visit: Payer: Self-pay

## 2019-10-12 VITALS — BP 114/63 | Ht 65.0 in | Wt 240.0 lb

## 2019-10-12 DIAGNOSIS — M25512 Pain in left shoulder: Secondary | ICD-10-CM | POA: Diagnosis not present

## 2019-10-12 DIAGNOSIS — S060X0D Concussion without loss of consciousness, subsequent encounter: Secondary | ICD-10-CM

## 2019-10-12 NOTE — Patient Instructions (Signed)
You have been diagnosed with a concussion, fortunately, it is slowly improving.  Please continue to avoid activities that cause headaches, and try decreasing screen time as much as possible.  You may return to work in advance your activities as able.  Please follow-up if you have worsening of your symptoms or any concerns.  We will also call you after your MRI results come back to discuss your shoulder further.

## 2019-10-12 NOTE — Progress Notes (Signed)
PCP: Reymundo Poll, MD  Subjective:   HPI: Patient is a 49 y.o. female here for follow-up for concussion.  She sustained a concussion on 09/26/2019 after she fell out of the bed.  Initially she was having concussion symptoms including nausea, dizziness, headaches.  At last visit on 7/15, she had some mild improvement in symptoms does continue to have nausea and headaches.  She is held out of work since the incident and returns today for reevaluation.  Today, patient states that she has had significant provement symptoms, her nausea has resolved, she feels like her balance is mostly returned though still needs help getting out of the shower, and has very mild occasional headaches which are improving as well.  She feels that she is ready go back to work.  She does continue to have left shoulder pain, she is scheduled for MRI on 10/22/2019.  Her pain is not changed in severity.  Past Medical History:  Diagnosis Date  . Diabetes mellitus without complication (HCC)   . Hypertension     Current Outpatient Medications on File Prior to Visit  Medication Sig Dispense Refill  . Albuterol Sulfate 108 (90 Base) MCG/ACT AEPB Inhale 2 puffs into the lungs every 4 (four) hours as needed. 1 each 4  . ALPRAZolam (XANAX) 0.5 MG tablet TAKE 1 TABLET BY MOUTH DAILY AS NEEDED FOR ANXIETY. 30 tablet 1  . atorvastatin (LIPITOR) 20 MG tablet TAKE 1 TABLET BY MOUTH ONCE A DAY 90 tablet 3  . betamethasone dipropionate 0.05 % cream Apply topically 2 (two) times daily. 30 g 0  . Canagliflozin-metFORMIN HCl ER (INVOKAMET XR) [phone] MG TB24 Take 1 tablet by mouth 2 (two) times daily with a meal. 180 tablet 3  . Continuous Blood Gluc Receiver (FREESTYLE LIBRE 2 READER SYSTM) DEVI 1 each by Does not apply route 4 (four) times daily. 1 each 0  . Continuous Blood Gluc Sensor (FREESTYLE LIBRE 2 SENSOR) MISC 1 application by Does not apply route 4 (four) times daily. 1 each 2  . DULoxetine (CYMBALTA) 60 MG capsule Take 1  capsule (60 mg total) by mouth daily. 30 capsule 2  . estradiol (ESTRACE VAGINAL) 0.1 MG/GM vaginal cream Place 1 Applicatorful vaginally 3 (three) times a week. Initially use nightly for 2 weeks. 42.5 g 3  . fluticasone (FLONASE) 50 MCG/ACT nasal spray Place 2 sprays into both nostrils daily for 10 days. 16 g 0  . Insulin Glargine (BASAGLAR KWIKPEN) 100 UNIT/ML Inject 0.6 mLs (60 Units total) into the skin daily AND 0.4 mLs (40 Units total) at bedtime. 30 mL 3  . Insulin Pen Needle (UNIFINE PENTIPS) 32G X 4 MM MISC Use to inject insulin into the skin 2 times daily 200 each 1  . lidocaine (LIDODERM) 5 % Place 1 patch onto the skin daily. Remove & Discard patch within 12 hours or as directed by MD 10 patch 0  . metoprolol succinate (TOPROL XL) 100 MG 24 hr tablet Take 1 tablet (100 mg total) by mouth daily. Take with or immediately following a meal. (Patient not taking: Reported on 12/09/2018) 90 tablet 3  . Multiple Vitamin (MULTIVITAMIN) tablet Take 1 tablet by mouth daily.    . nitroGLYCERIN (NITRODUR - DOSED IN MG/24 HR) 0.2 mg/hr patch Apply 1/4th patch to affected shoulder, change daily 30 patch 1  . olmesartan-hydrochlorothiazide (BENICAR HCT) 20-12.5 MG tablet Take 1 tablet by mouth daily. 180 tablet 3  . Omega-3 Fatty Acids (FISH OIL) 1000 MG CAPS Take by mouth.    Marland Kitchen  omeprazole (PRILOSEC) 20 MG capsule TAKE 1 CAPSULE BY MOUTH ONCE A DAY 90 capsule 3  . Probiotic Product (PROBIOTIC-10 PO) Take by mouth.    . Semaglutide,0.25 or 0.5MG /DOS, (OZEMPIC, 0.25 OR 0.5 MG/DOSE,) 2 MG/1.5ML SOPN Inject 0.1875 mLs (0.25 mg total) into the skin once a week. 1.5 mL 0  . Semaglutide,0.25 or 0.5MG /DOS, (OZEMPIC, 0.25 OR 0.5 MG/DOSE,) 2 MG/1.5ML SOPN Inject 0.375 mLs (0.5 mg total) into the skin once a week. 1.5 mL 2  . vitamin B-12 (CYANOCOBALAMIN) 1000 MCG tablet Take by mouth.     No current facility-administered medications on file prior to visit.    Past Surgical History:  Procedure Laterality Date   . CHOLECYSTECTOMY    . DILATION AND CURETTAGE OF UTERUS      Allergies  Allergen Reactions  . Ace Inhibitors Anaphylaxis  . Effexor Xr [Venlafaxine Hcl]     Patient states caused symptomatic elevated serotonin levels    Social History   Socioeconomic History  . Marital status: Married    Spouse name: Not on file  . Number of children: Not on file  . Years of education: Not on file  . Highest education level: Not on file  Occupational History  . Not on file  Tobacco Use  . Smoking status: Never Smoker  . Smokeless tobacco: Never Used  . Tobacco comment: lives with smokers   Vaping Use  . Vaping Use: Never used  Substance and Sexual Activity  . Alcohol use: Not Currently    Comment: Rarely.  . Drug use: Not on file  . Sexual activity: Not on file  Other Topics Concern  . Not on file  Social History Narrative  . Not on file   Social Determinants of Health   Financial Resource Strain:   . Difficulty of Paying Living Expenses:   Food Insecurity:   . Worried About Programme researcher, broadcasting/film/video in the Last Year:   . Barista in the Last Year:   Transportation Needs:   . Freight forwarder (Medical):   Marland Kitchen Lack of Transportation (Non-Medical):   Physical Activity:   . Days of Exercise per Week:   . Minutes of Exercise per Session:   Stress:   . Feeling of Stress :   Social Connections:   . Frequency of Communication with Friends and Family:   . Frequency of Social Gatherings with Friends and Family:   . Attends Religious Services:   . Active Member of Clubs or Organizations:   . Attends Banker Meetings:   Marland Kitchen Marital Status:   Intimate Partner Violence:   . Fear of Current or Ex-Partner:   . Emotionally Abused:   Marland Kitchen Physically Abused:   . Sexually Abused:     Family History  Problem Relation Age of Onset  . Cancer Father        Head and neck cancer. Has PEG  . Lupus Sister   . Breast cancer Other   . Breast cancer Other     BP (!) 114/63    Ht 5\' 5"  (1.651 m)   Wt (!) 240 lb (108.9 kg)   LMP 10/04/2019   BMI 39.94 kg/m   Review of Systems: See HPI above.     Objective:  Physical Exam:  Gen: NAD, comfortable in exam room  Concussion PE  EOMI. PERRLA w/o pain.  H/X test/Smooth Pursuits: WNL, no nystagmus. No symptoms Horizontal Saccades: WNL without undershoot, overshoot. No symptoms.  Balance: WNL w/o  excessive sway.  Runway Walk: WNL w/o symptoms.    Assessment & Plan:  1.  Concussion Patient with continued improvement in symptoms, she now feels she is ready to go back to work, which I believe is safe.  We did discuss the possibility of spending prolonged time looking at computer screens may exacerbate her symptoms, so she was sent with a work note today to try to limit screen time for the first 1 to 2 weeks and advance as tolerated.  Follow-up as needed if symptoms worsen or if any concerns.  2. left shoulder pain Patient has continued left shoulder pain symptoms which are similar to previous visits.  We will await MRI results from previously ordered MRI, discussed that Dr. Margaretha Sheffield will call her with results of this MRI once they come back.  Patient seen and evaluated with the sports medicine fellow.  I agree with the above plan of care.  Patient is doing well from the standpoint of her concussion.  I think she is okay to return to work but we will asked him to limit her screen time for a couple of weeks if possible.  For her left shoulder pain, I will call her after I review her MRI.  We will delineate further treatment based on those findings.

## 2019-10-13 ENCOUNTER — Other Ambulatory Visit: Payer: Self-pay | Admitting: *Deleted

## 2019-10-13 MED ORDER — ALPRAZOLAM 0.5 MG PO TABS: 0.5000 mg | ORAL_TABLET | Freq: Every day | ORAL | 0 refills | Status: DC | PRN

## 2019-10-13 MED FILL — ALPRAZolam 0.5 MG TABS: 0.5 | 30 days supply | Qty: 30 | Fill #0

## 2019-10-13 NOTE — Telephone Encounter (Signed)
Received refill request for xanax. Reviewed notes. She has been taking the PRN for GAD for a few years. Previously documented that 30 pills would last her 3 months. However since March she has been filling monthly. Looks like she was started on duloxetine in March with plans to up titrate to 60 mg. She was also referred to [facility]. Please have her schedule follow up with me (or [facility] if I am unavailable) for her anxiety. I have approved a 1 month refill.

## 2019-10-16 ENCOUNTER — Ambulatory Visit: Payer: No Typology Code available for payment source

## 2019-10-22 ENCOUNTER — Other Ambulatory Visit: Payer: Self-pay

## 2019-10-22 ENCOUNTER — Ambulatory Visit
Admission: RE | Admit: 2019-10-22 | Discharge: 2019-10-22 | Disposition: A | Discharge status: Home / Self Care | Payer: No Typology Code available for payment source | Source: Ambulatory Visit | Attending: Sports Medicine | Admitting: Sports Medicine

## 2019-10-22 DIAGNOSIS — M67912 Unspecified disorder of synovium and tendon, left shoulder: Secondary | ICD-10-CM

## 2019-10-23 ENCOUNTER — Telehealth: Payer: Self-pay | Admitting: Sports Medicine

## 2019-10-23 NOTE — Telephone Encounter (Signed)
°  MRI of this patient's left shoulder is reviewed. No evidence of rotator cuff or labral tearing. 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 She has a follow-up appointment with Dr. Pearletha Forge on August 9. She is encouraged to keep that appointment to discuss further treatment.

## 2019-10-24 ENCOUNTER — Ambulatory Visit: Payer: No Typology Code available for payment source | Admitting: Licensed Clinical Social Worker

## 2019-10-24 MED FILL — FREESTYLE LIBRE 2 SENSOR MI: 28 days supply | Qty: 2 | Fill #11

## 2019-10-24 MED FILL — NITROGLYCERIN 0.2 MG/HR PTC: 0.2 | 84 days supply | Qty: 21 | Fill #1

## 2019-10-25 ENCOUNTER — Encounter: Payer: No Typology Code available for payment source | Admitting: Internal Medicine

## 2019-10-25 ENCOUNTER — Other Ambulatory Visit: Payer: Self-pay | Admitting: Internal Medicine

## 2019-10-25 ENCOUNTER — Ambulatory Visit
Admission: RE | Admit: 2019-10-25 | Discharge: 2019-10-25 | Disposition: A | Discharge status: Home / Self Care | Payer: No Typology Code available for payment source | Source: Ambulatory Visit | Attending: Internal Medicine | Admitting: Internal Medicine

## 2019-10-25 ENCOUNTER — Other Ambulatory Visit: Payer: Self-pay

## 2019-10-25 DIAGNOSIS — Z1231 Encounter for screening mammogram for malignant neoplasm of breast: Secondary | ICD-10-CM

## 2019-10-25 LAB — HM DIABETES EYE EXAM

## 2019-10-26 MED FILL — OLMESARTAN-HCTZ 20-12.5 MG: 20-12.5 | 30 days supply | Qty: 30 | Fill #8

## 2019-10-30 ENCOUNTER — Encounter: Payer: Self-pay | Admitting: Family Medicine

## 2019-10-30 ENCOUNTER — Ambulatory Visit: Payer: No Typology Code available for payment source | Admitting: Family Medicine

## 2019-10-30 ENCOUNTER — Other Ambulatory Visit: Payer: Self-pay

## 2019-10-30 VITALS — BP 108/72 | Ht 65.0 in | Wt 245.0 lb

## 2019-10-30 DIAGNOSIS — S060X0D Concussion without loss of consciousness, subsequent encounter: Secondary | ICD-10-CM

## 2019-10-30 DIAGNOSIS — M25512 Pain in left shoulder: Secondary | ICD-10-CM | POA: Diagnosis not present

## 2019-10-30 NOTE — Patient Instructions (Signed)
Start physical therapy when you come back from the beach for your shoulder. Try the nitro patches again - 1/4th patch to the left shoulder, change daily. Do motion exercises we discussed (arm circles, swings, table slides) 3 sets of 10 twice a day. Heat 15 minutes at a time as needed. Try to minimize reaching above shoulder level. Follow up with [place] in about 4 weeks.

## 2019-10-30 NOTE — Progress Notes (Signed)
PCP: Reymundo Poll, MD  Subjective:   HPI: Patient is a 49 y.o. female here for left shoulder injury, concussion.  7/22: Patient is a 49 y.o. female here for follow-up for concussion.  She sustained a concussion on 09/26/2019 after she fell out of the bed.  Initially she was having concussion symptoms including nausea, dizziness, headaches.  At last visit on 7/15, she had some mild improvement in symptoms does continue to have nausea and headaches.  She is held out of work since the incident and returns today for reevaluation.  Today, patient states that she has had significant provement symptoms, her nausea has resolved, she feels like her balance is mostly returned though still needs help getting out of the shower, and has very mild occasional headaches which are improving as well.  She feels that she is ready go back to work.  She does continue to have left shoulder pain, she is scheduled for MRI on 10/22/2019.  Her pain is not changed in severity.  8/9: Patient reports she's doing well. Concussion improving - minimal headaches now. No nausea. Some feeling of 'wobbliness' at times. Processing much better too. Work has been accommodating with limited screen time, breaks if needed. Left shoulder pain is improved though motion still limited.  Past Medical History:  Diagnosis Date  . Diabetes mellitus without complication (HCC)   . Hypertension     Current Outpatient Medications on File Prior to Visit  Medication Sig Dispense Refill  . Albuterol Sulfate 108 (90 Base) MCG/ACT AEPB Inhale 2 puffs into the lungs every 4 (four) hours as needed. 1 each 4  . ALPRAZolam (XANAX) 0.5 MG tablet Take 1 tablet (0.5 mg total) by mouth daily as needed for anxiety. for anxiety 30 tablet 0  . atorvastatin (LIPITOR) 20 MG tablet TAKE 1 TABLET BY MOUTH ONCE A DAY 90 tablet 3  . betamethasone dipropionate 0.05 % cream Apply topically 2 (two) times daily. 30 g 0  . Canagliflozin-metFORMIN HCl ER  (INVOKAMET XR) [id number] MG TB24 Take 1 tablet by mouth 2 (two) times daily with a meal. 180 tablet 3  . Continuous Blood Gluc Receiver (FREESTYLE LIBRE 2 READER SYSTM) DEVI 1 each by Does not apply route 4 (four) times daily. 1 each 0  . Continuous Blood Gluc Sensor (FREESTYLE LIBRE 2 SENSOR) MISC 1 application by Does not apply route 4 (four) times daily. 1 each 2  . DULoxetine (CYMBALTA) 60 MG capsule Take 1 capsule (60 mg total) by mouth daily. 30 capsule 2  . estradiol (ESTRACE VAGINAL) 0.1 MG/GM vaginal cream Place 1 Applicatorful vaginally 3 (three) times a week. Initially use nightly for 2 weeks. 42.5 g 3  . fluticasone (FLONASE) 50 MCG/ACT nasal spray Place 2 sprays into both nostrils daily for 10 days. 16 g 0  . Insulin Glargine (BASAGLAR KWIKPEN) 100 UNIT/ML Inject 0.6 mLs (60 Units total) into the skin daily AND 0.4 mLs (40 Units total) at bedtime. 30 mL 3  . Insulin Pen Needle (UNIFINE PENTIPS) 32G X 4 MM MISC Use to inject insulin into the skin 2 times daily 200 each 1  . lidocaine (LIDODERM) 5 % Place 1 patch onto the skin daily. Remove & Discard patch within 12 hours or as directed by MD 10 patch 0  . metoprolol succinate (TOPROL XL) 100 MG 24 hr tablet Take 1 tablet (100 mg total) by mouth daily. Take with or immediately following a meal. (Patient not taking: Reported on 12/09/2018) 90 tablet 3  . Multiple  Vitamin (MULTIVITAMIN) tablet Take 1 tablet by mouth daily.    . nitroGLYCERIN (NITRODUR - DOSED IN MG/24 HR) 0.2 mg/hr patch Apply 1/4th patch to affected shoulder, change daily 30 patch 1  . olmesartan-hydrochlorothiazide (BENICAR HCT) 20-12.5 MG tablet Take 1 tablet by mouth daily. 180 tablet 3  . Omega-3 Fatty Acids (FISH OIL) 1000 MG CAPS Take by mouth.    Marland Kitchen omeprazole (PRILOSEC) 20 MG capsule TAKE 1 CAPSULE BY MOUTH ONCE A DAY 90 capsule 3  . Probiotic Product (PROBIOTIC-10 PO) Take by mouth.    . Semaglutide,0.25 or 0.5MG /DOS, (OZEMPIC, 0.25 OR 0.5 MG/DOSE,) 2 MG/1.5ML SOPN  Inject 0.1875 mLs (0.25 mg total) into the skin once a week. 1.5 mL 0  . Semaglutide,0.25 or 0.5MG /DOS, (OZEMPIC, 0.25 OR 0.5 MG/DOSE,) 2 MG/1.5ML SOPN Inject 0.375 mLs (0.5 mg total) into the skin once a week. 1.5 mL 2  . VICTOZA 18 MG/3ML SOPN Inject 1.8 mg into the skin daily.    . vitamin B-12 (CYANOCOBALAMIN) 1000 MCG tablet Take by mouth.     No current facility-administered medications on file prior to visit.    Past Surgical History:  Procedure Laterality Date  . CHOLECYSTECTOMY    . DILATION AND CURETTAGE OF UTERUS      Allergies  Allergen Reactions  . Ace Inhibitors Anaphylaxis  . Effexor Xr [Venlafaxine Hcl]     Patient states caused symptomatic elevated serotonin levels    Social History   Socioeconomic History  . Marital status: Married    Spouse name: Not on file  . Number of children: Not on file  . Years of education: Not on file  . Highest education level: Not on file  Occupational History  . Not on file  Tobacco Use  . Smoking status: Never Smoker  . Smokeless tobacco: Never Used  . Tobacco comment: lives with smokers   Vaping Use  . Vaping Use: Never used  Substance and Sexual Activity  . Alcohol use: Not Currently    Comment: Rarely.  . Drug use: Not on file  . Sexual activity: Not on file  Other Topics Concern  . Not on file  Social History Narrative  . Not on file   Social Determinants of Health   Financial Resource Strain:   . Difficulty of Paying Living Expenses:   Food Insecurity:   . Worried About Programme researcher, broadcasting/film/video in the Last Year:   . Barista in the Last Year:   Transportation Needs:   . Freight forwarder (Medical):   Marland Kitchen Lack of Transportation (Non-Medical):   Physical Activity:   . Days of Exercise per Week:   . Minutes of Exercise per Session:   Stress:   . Feeling of Stress :   Social Connections:   . Frequency of Communication with Friends and Family:   . Frequency of Social Gatherings with Friends and  Family:   . Attends Religious Services:   . Active Member of Clubs or Organizations:   . Attends Banker Meetings:   Marland Kitchen Marital Status:   Intimate Partner Violence:   . Fear of Current or Ex-Partner:   . Emotionally Abused:   Marland Kitchen Physically Abused:   . Sexually Abused:     Family History  Problem Relation Age of Onset  . Cancer Father        Head and neck cancer. Has PEG  . Lupus Sister   . Breast cancer Other   . Breast cancer Other  BP 108/72   Ht 5\' 5"  (1.651 m)   Wt 245 lb (111.1 kg)   LMP 10/04/2019   BMI 40.77 kg/m   Review of Systems: See HPI above.     Objective:  Physical Exam:  Gen: NAD, comfortable in exam room  Left shoulder: No swelling, ecchymoses.  No gross deformity. No TTP AC, biceps tendon. Full abduction.  Lacks about 30 degrees flexion, 30 degrees ER compared to right. Positive Hawkins, Neers. Strength 5-/5 with empty can and 5/5 resisted internal/external rotation. NV intact distally.   Assessment & Plan:  1. Left shoulder injury - MRI reassuring with only subacromial bursitis.  Exam also consistent with developing adhesive capsulitis.  Shown codman exercises.  Start physical therapy.  Nitro patches if these don't cause her headaches with concurrent resolving concussion.  Heat.  F/u in 4 weeks.  2. Concussion - improving.  Continue with current work restrictions.

## 2019-10-31 MED FILL — DULOXETINE HCL 60 MG CPEP: 60 | 30 days supply | Qty: 30 | Fill #2

## 2019-11-07 MED FILL — BASAGLAR 100 UNIT/ML KWIKPE: 100 | 30 days supply | Qty: 30 | Fill #2

## 2019-11-10 ENCOUNTER — Other Ambulatory Visit: Payer: Self-pay | Admitting: Internal Medicine

## 2019-11-10 NOTE — Telephone Encounter (Signed)
Unable to fill xanax from home, last filled 7/23 for 30 day supply. Please see my prior telephone note. Patient was recently assigned to me but not yet established. Needs follow up but I am unavailable until November. I have reached out to [facility] and Dr. Criselda Peaches to see if she can be reassigned. Thanks!

## 2019-11-10 NOTE — Telephone Encounter (Signed)
Next appt scheduled 02/07/20 with PCP. 

## 2019-11-13 ENCOUNTER — Other Ambulatory Visit: Payer: Self-pay | Admitting: Internal Medicine

## 2019-11-13 MED FILL — ALPRAZolam 0.5 MG TABS: 0.5 | 30 days supply | Qty: 30 | Fill #0

## 2019-11-13 NOTE — Telephone Encounter (Signed)
It looks like Dr. Mikey Bussing just refilled this 3 days ago. Did that script not go through?

## 2019-11-15 ENCOUNTER — Other Ambulatory Visit: Payer: Self-pay

## 2019-11-15 DIAGNOSIS — M25512 Pain in left shoulder: Secondary | ICD-10-CM

## 2019-11-15 NOTE — Progress Notes (Signed)
Pt is asking for another PT referral, [facility] is out of network.

## 2019-11-20 ENCOUNTER — Ambulatory Visit (INDEPENDENT_AMBULATORY_CARE_PROVIDER_SITE_OTHER): Payer: No Typology Code available for payment source | Admitting: Internal Medicine

## 2019-11-20 ENCOUNTER — Other Ambulatory Visit: Payer: Self-pay

## 2019-11-20 ENCOUNTER — Other Ambulatory Visit (HOSPITAL_COMMUNITY): Payer: Self-pay | Admitting: Internal Medicine

## 2019-11-20 ENCOUNTER — Encounter: Payer: Self-pay | Admitting: Internal Medicine

## 2019-11-20 VITALS — BP 104/64 | HR 64 | Temp 98.0°F | Ht 65.0 in | Wt 245.6 lb

## 2019-11-20 DIAGNOSIS — Z794 Long term (current) use of insulin: Secondary | ICD-10-CM

## 2019-11-20 DIAGNOSIS — E785 Hyperlipidemia, unspecified: Secondary | ICD-10-CM | POA: Diagnosis not present

## 2019-11-20 DIAGNOSIS — E538 Deficiency of other specified B group vitamins: Secondary | ICD-10-CM | POA: Diagnosis not present

## 2019-11-20 DIAGNOSIS — E119 Type 2 diabetes mellitus without complications: Secondary | ICD-10-CM | POA: Diagnosis not present

## 2019-11-20 DIAGNOSIS — I10 Essential (primary) hypertension: Secondary | ICD-10-CM | POA: Diagnosis not present

## 2019-11-20 DIAGNOSIS — M67912 Unspecified disorder of synovium and tendon, left shoulder: Secondary | ICD-10-CM

## 2019-11-20 DIAGNOSIS — F411 Generalized anxiety disorder: Secondary | ICD-10-CM

## 2019-11-20 DIAGNOSIS — E1142 Type 2 diabetes mellitus with diabetic polyneuropathy: Secondary | ICD-10-CM | POA: Diagnosis not present

## 2019-11-20 LAB — POCT GLYCOSYLATED HEMOGLOBIN (HGB A1C): Hemoglobin A1C: 8.7 % — AB (ref 4.0–5.6)

## 2019-11-20 LAB — GLUCOSE, CAPILLARY: Glucose-Capillary: 222 mg/dL — ABNORMAL HIGH (ref 70–99)

## 2019-11-20 MED ORDER — LIDOCAINE 5 % EX PTCH: 1.0000 | MEDICATED_PATCH | CUTANEOUS | 0 refills | Status: DC

## 2019-11-20 MED ORDER — SEMAGLUTIDE (1 MG/DOSE) 2 MG/1.5ML ~~LOC~~ SOPN: 1.0000 mg | PEN_INJECTOR | SUBCUTANEOUS | 12 refills | Status: DC

## 2019-11-20 MED FILL — OZEMPIC (1 MG/DOSE) 4 MG/3M: 4 | 28 days supply | Qty: 3 | Fill #0

## 2019-11-20 MED FILL — LIDOCAINE 5 % PTCH: 5 | 5 days supply | Qty: 10 | Fill #0

## 2019-11-20 NOTE — Assessment & Plan Note (Signed)
Patient on B complex vitamin. Mention in previous note to check B12 at next visit. We will check today and discuss need for continue supplementation. In addition patient has been on Metformin, so checking once yearly B12 is recommended.  Plan: - B12

## 2019-11-20 NOTE — Assessment & Plan Note (Addendum)
This problem is chronic and controlled. Denies any lightheaded or dizziness upon standing.  Plan; Continue Metoprolol Succinate 100 mg daily Continue HCTZ 25 mg daily Continue Olmesartan 40 mg daily Check BMP today   BP Readings from Last 3 Encounters:  11/20/19 104/64  10/30/19 108/72  10/12/19 (!) 114/63

## 2019-11-20 NOTE — Progress Notes (Signed)
   CC: type 2 diabetes mellitus , hyperlipidemia, B12 deficiency   HPI:[name] is a 49 y.o. female who presents for evaluation of type 2 diabetes mellitus, hyperlipidemia, and B12 deficiency . Please see individual problem based A/P for details.  Depression, PHQ-9: Based on the patients    Office Visit from 11/20/2019 in [facility]  PHQ-9 Total Score 9     score we have mild depression.   Past Medical History:  Diagnosis Date  . Diabetes mellitus without complication (HCC)   . Hypertension    Review of Systems:   Review of Systems  Constitutional: Negative for chills and fever.  Gastrointestinal: Negative for constipation and diarrhea.     Physical Exam: Vitals:   11/20/19 0853  BP: 104/64  Pulse: 64  Temp: 98 F (36.7 C)  TempSrc: Oral  SpO2: 97%  Weight: 245 lb 9.6 oz (111.4 kg)  Height: 5\' 5"  (1.651 m)    General: NAD, nl appearance HEENT: Normocephalic, atraumatic , Conjunctiva nl  Cardiovascular: Normal rate, regular rhythm.  No murmurs, rubs, or gallops Pulmonary : Equal breath sounds, No wheezes, rales, or rhonchi Abdominal: soft, nontender,  bowel sounds present   Assessment & Plan:   See Encounters Tab for problem based charting.  Patient discussed with Dr. 

## 2019-11-20 NOTE — Patient Instructions (Addendum)
Thank you for trusting me with your care. To recap, today we discussed the following:   1. Vitamin B12 deficiency  - CBC with Diff - Vitamin B12  2. Essential hypertension  - BMP8+Anion Gap  3. Type 2 diabetes mellitus without complication)  - POC Hbg A1C - Semaglutide, 1 MG/DOSE, 2 MG/1.5ML SOPN; Inject 0.75 mLs (1 mg total) into the skin once a week.  Dispense: 3 mL; Refill: 12  4. Hyperlipidemia, unspecified hyperlipidemia type  - Lipid Profile  5. Tendinopathy of left rotator cuff  - lidocaine (LIDODERM) 5 %; Place 1 patch onto the skin daily. Remove & Discard patch within 12 hours or as directed by MD  Dispense: 10 patch; Refill: 0

## 2019-11-20 NOTE — Assessment & Plan Note (Signed)
Patient reports her anxiety and depression is going pretty well since changes made at her last visit. Her PHQ-9 today is 9, suggesting moderate depression. She continues to have anxiety , but it has been manageable recently.  Taking Duloxetine 60 mg and Xanax .5mg  daily.   Plan : Continue Duloxetine 60 mg daily Continue Xanax .5 mg daily

## 2019-11-20 NOTE — Assessment & Plan Note (Addendum)
  YOK:HTXH: Hgb A1c 7.9 % at the last visit. Current A1c 8.7%. Average glucometer reading 215 over the past thirty days with a high of 274 and low of 167  which was not symptomatic.  The patient denied polyuria, polydipsia, headache, nausea, or vomiting. At her visit in July she was switched from Victoza to [facility] because this medicaion is covered by her insurance plan. She tolerated the starting dose and currently taking Ozempic .5mg  weekly. She was previously on Victoza 1.8 mg.   Plan: Increase Ozempic to 1 mg daily Continue Invokamet XR [phone] BID w/meals Continue Insulin glargine  60 units am , 40 units qhs  Repeat A1c at next

## 2019-11-20 NOTE — Assessment & Plan Note (Addendum)
Patient is on atorvastatin 20 mg for primary prevention. No recent lipid panel results.   Plan: - lipid panel  - calculate ASCVD risk   Addendum: The ASCVD Risk score Denman George DC Jr., et al., 2013) failed to calculate for the following reasons:   The valid total cholesterol range is 130 to 320 mg/dL

## 2019-11-21 ENCOUNTER — Encounter: Payer: Self-pay | Admitting: Internal Medicine

## 2019-11-21 ENCOUNTER — Other Ambulatory Visit: Payer: Self-pay | Admitting: Internal Medicine

## 2019-11-21 DIAGNOSIS — M67912 Unspecified disorder of synovium and tendon, left shoulder: Secondary | ICD-10-CM

## 2019-11-21 LAB — CBC WITH DIFFERENTIAL/PLATELET
Basophils Absolute: 0.1 10*3/uL (ref 0.0–0.2)
Basos: 1 %
EOS (ABSOLUTE): 0.2 10*3/uL (ref 0.0–0.4)
Eos: 2 %
Hematocrit: 45.2 % (ref 34.0–46.6)
Hemoglobin: 15.4 g/dL (ref 11.1–15.9)
Immature Grans (Abs): 0 10*3/uL (ref 0.0–0.1)
Immature Granulocytes: 0 %
Lymphocytes Absolute: 2.1 10*3/uL (ref 0.7–3.1)
Lymphs: 23 %
MCH: 29.3 pg (ref 26.6–33.0)
MCHC: 34.1 g/dL (ref 31.5–35.7)
MCV: 86 fL (ref 79–97)
Monocytes Absolute: 0.6 10*3/uL (ref 0.1–0.9)
Monocytes: 7 %
Neutrophils Absolute: 6.2 10*3/uL (ref 1.4–7.0)
Neutrophils: 67 %
Platelets: 307 10*3/uL (ref 150–450)
RBC: 5.25 x10E6/uL (ref 3.77–5.28)
RDW: 12.5 % (ref 11.7–15.4)
WBC: 9.3 10*3/uL (ref 3.4–10.8)

## 2019-11-21 LAB — LIPID PANEL
Chol/HDL Ratio: 2.5 ratio (ref 0.0–4.4)
Cholesterol, Total: 117 mg/dL (ref 100–199)
HDL: 47 mg/dL (ref >39)
LDL Chol Calc (NIH): 40 mg/dL (ref 0–99)
Triglycerides: 187 mg/dL — ABNORMAL HIGH (ref 0–149)
VLDL Cholesterol Cal: 30 mg/dL (ref 5–40)

## 2019-11-21 LAB — VITAMIN B12: Vitamin B-12: 389 pg/mL (ref 232–1245)

## 2019-11-21 LAB — BMP8+ANION GAP
Anion Gap: 18 mmol/L (ref 10.0–18.0)
BUN/Creatinine Ratio: 20 (ref 9–23)
BUN: 13 mg/dL (ref 6–24)
CO2: 21 mmol/L (ref 20–29)
Calcium: 9.7 mg/dL (ref 8.7–10.2)
Chloride: 100 mmol/L (ref 96–106)
Creatinine, Ser: 0.66 mg/dL (ref 0.57–1.00)
GFR calc Af Amer: 122 mL/min/{1.73_m2} (ref >59)
GFR calc non Af Amer: 106 mL/min/{1.73_m2} (ref >59)
Glucose: 222 mg/dL — ABNORMAL HIGH (ref 65–99)
Potassium: 4.4 mmol/L (ref 3.5–5.2)
Sodium: 139 mmol/L (ref 134–144)

## 2019-11-21 NOTE — Telephone Encounter (Signed)
My afternoon schedule is full.  However you can contact the front desk and one of my colleagues might have availability. If it is a minor issue we can discuss it via telephone. The reason I was calling is to discuss your elevated Triglycerides on your lipid panel. I recommend the prescription strength fish oil. Your LDL is at goal on your statin medication.

## 2019-11-22 NOTE — Telephone Encounter (Signed)
Review meds 

## 2019-11-22 NOTE — Progress Notes (Signed)
Internal Medicine Clinic Attending  Case discussed with Dr. Steen  At the time of the visit.  We reviewed the resident's history and exam and pertinent patient test results.  I agree with the assessment, diagnosis, and plan of care documented in the resident's note.  

## 2019-11-23 ENCOUNTER — Other Ambulatory Visit: Payer: Self-pay | Admitting: *Deleted

## 2019-11-23 ENCOUNTER — Other Ambulatory Visit: Payer: Self-pay | Admitting: Student

## 2019-11-23 DIAGNOSIS — E1142 Type 2 diabetes mellitus with diabetic polyneuropathy: Secondary | ICD-10-CM

## 2019-11-23 MED ORDER — INVOKAMET XR 150-1000 MG PO TB24: 1.0000 | ORAL_TABLET | Freq: Two times a day (BID) | ORAL | 3 refills | Status: DC

## 2019-11-23 MED FILL — INVOKAMET XR 150-1,000 MG T: 150-1000 | 90 days supply | Qty: 180 | Fill #0

## 2019-11-23 MED FILL — FREESTYLE LIBRE 2 SENSOR MI: 28 days supply | Qty: 2 | Fill #0

## 2019-11-24 ENCOUNTER — Ambulatory Visit (INDEPENDENT_AMBULATORY_CARE_PROVIDER_SITE_OTHER): Payer: No Typology Code available for payment source | Admitting: Internal Medicine

## 2019-11-24 ENCOUNTER — Other Ambulatory Visit: Payer: Self-pay

## 2019-11-24 DIAGNOSIS — L0292 Furuncle, unspecified: Secondary | ICD-10-CM | POA: Diagnosis not present

## 2019-11-24 MED ORDER — DOXYCYCLINE HYCLATE 50 MG PO CAPS: 100.0000 mg | ORAL_CAPSULE | Freq: Two times a day (BID) | ORAL | 0 refills | Status: AC

## 2019-11-24 MED ORDER — FLUCONAZOLE 100 MG PO TABS: 100.0000 mg | ORAL_TABLET | Freq: Every day | ORAL | 0 refills | Status: AC

## 2019-11-24 MED FILL — DOXYCYCLINE HYCLATE 100 MG: 100 | 10 days supply | Qty: 20 | Fill #0

## 2019-11-24 MED FILL — FLUCONAZOLE 100 MG TABLET: 100 | 3 days supply | Qty: 3 | Fill #0

## 2019-11-24 NOTE — Progress Notes (Signed)
Acute Office Visit   Patient ID: Kelly Gallagher, female    DOB: 1971-01-15, 49 y.o.   MRN: [id number]  Subjective:  CC: boil  HPI 49 y.o. female with diabetes who  presents today for evaluation of a wound on her right groin. First noted about 2 weeks ago after spending a weekend on the beach at which time she received a few bug bites. Over the following week, there were no significant change in the area however, now, over the past week, she has noted increased size of it with some surrounding erythema. She denies systemic symptoms. Pain is described as bearable. She denies any drainage from the site.       ACTIVE MEDICATIONS   Current Outpatient Medications on File Prior to Visit  Medication Sig Dispense Refill  . Albuterol Sulfate 108 (90 Base) MCG/ACT AEPB Inhale 2 puffs into the lungs every 4 (four) hours as needed. 1 each 4  . ALPRAZolam (XANAX) 0.5 MG tablet TAKE 1 TABLET BY MOUTH ONCE DAILY AS NEEDED FOR ANXIETY 30 tablet 0  . atorvastatin (LIPITOR) 20 MG tablet TAKE 1 TABLET BY MOUTH ONCE A DAY 90 tablet 3  . betamethasone dipropionate 0.05 % cream Apply topically 2 (two) times daily. 30 g 0  . Canagliflozin-metFORMIN HCl ER (INVOKAMET XR) [phone] MG TB24 Take 1 tablet by mouth 2 (two) times daily with a meal. 180 tablet 3  . Continuous Blood Gluc Receiver (FREESTYLE LIBRE 2 READER SYSTM) DEVI 1 each by Does not apply route 4 (four) times daily. 1 each 0  . Continuous Blood Gluc Sensor (FREESTYLE LIBRE 2 SENSOR) MISC 1 application by Does not apply route 4 (four) times daily. 1 each 2  . DULoxetine (CYMBALTA) 60 MG capsule Take 1 capsule (60 mg total) by mouth daily. 30 capsule 2  . Insulin Glargine (BASAGLAR KWIKPEN) 100 UNIT/ML Inject 0.6 mLs (60 Units total) into the skin daily AND 0.4 mLs (40 Units total) at bedtime. 30 mL 3  . Insulin Pen Needle (UNIFINE PENTIPS) 32G X 4 MM MISC Use to inject insulin into the skin 2 times daily 200 each 1  . lidocaine (LIDODERM) 5 % Place 1  patch onto the skin daily. Remove & Discard patch within 12 hours or as directed by MD 10 patch 0  . metoprolol succinate (TOPROL XL) 100 MG 24 hr tablet Take 1 tablet (100 mg total) by mouth daily. Take with or immediately following a meal. (Patient not taking: Reported on 12/09/2018) 90 tablet 3  . Multiple Vitamin (MULTIVITAMIN) tablet Take 1 tablet by mouth daily.    . nitroGLYCERIN (NITRODUR - DOSED IN MG/24 HR) 0.2 mg/hr patch Apply 1/4th patch to affected shoulder, change daily 30 patch 1  . olmesartan-hydrochlorothiazide (BENICAR HCT) 20-12.5 MG tablet Take 1 tablet by mouth daily. 180 tablet 3  . Omega-3 Fatty Acids (FISH OIL) 1000 MG CAPS Take by mouth.    Marland Kitchen omeprazole (PRILOSEC) 20 MG capsule TAKE 1 CAPSULE BY MOUTH ONCE A DAY 90 capsule 3  . Probiotic Product (PROBIOTIC-10 PO) Take by mouth.    . Semaglutide, 1 MG/DOSE, 2 MG/1.5ML SOPN Inject 0.75 mLs (1 mg total) into the skin once a week. 3 mL 12  . vitamin B-12 (CYANOCOBALAMIN) 1000 MCG tablet Take by mouth.     No current facility-administered medications on file prior to visit.    ROS  Review of Systems  Constitutional: Negative for chills and fever.  Musculoskeletal: Negative for arthralgias and myalgias.  Skin:  Positive for wound.    Objective:   BP 119/67 (BP Location: Left Arm, Patient Position: Sitting, Cuff Size: Normal)   Temp 98.3 F (36.8 C) (Oral)   Wt 245 lb (111.1 kg)   SpO2 100%   BMI 40.77 kg/m  Wt Readings from Last 3 Encounters:  11/24/19 245 lb (111.1 kg)  11/20/19 245 lb 9.6 oz (111.4 kg)  10/30/19 245 lb (111.1 kg)   Physical Exam Constitutional:      Appearance: Normal appearance. She is not ill-appearing or toxic-appearing.  Cardiovascular:     Rate and Rhythm: Normal rate and regular rhythm.  Pulmonary:     Effort: Pulmonary effort is normal.  Skin:    Findings: Lesion present.     Comments: Approximate 1cm raised nodule with surrounding erythema located in the right inguinal fold. No  appreciable fluid collection on palpation. No apparent drainage from the nodule. It is slightly tender on palpation.  Also of note, she has some skin breakdown along the inguinal fold and under the pannus that is located outside the area of erythema and is approximately 3-4cm away from the nodule.      Assessment & Plan:   Problem List Items Addressed This Visit      Musculoskeletal and Integument   Furuncle    Questionable furuncle vs cellultis 2/2 to bug bite.  There was no fluid collection that I could appreciate on physical exam. She has had no systemic symptoms which is also reassuring. She is at high risk for infection 2/2 DM. Additionally, her body habitus does create a moist warm environment which may increase risk. Plan: We discussed that will trial a 10d course of doxycycline and have her follow up in a week. Return precautions including fever, chills, worsening erythema, drainage, or new wounds discussed.       Relevant Medications   fluconazole (DIFLUCAN) 100 MG tablet     Doxycycline 100mg  BID x10d    Pt discussed with Dr. , MD Internal Medicine Resident PGY-2 [facility] Pager: [phone] 11/25/2019 12:01 AM

## 2019-11-24 NOTE — Assessment & Plan Note (Addendum)
Questionable furuncle vs cellultis 2/2 to bug bite.  There was no fluid collection that I could appreciate on physical exam. She has had no systemic symptoms which is also reassuring. She is at high risk for infection 2/2 DM. Additionally, her body habitus does create a moist warm environment which may increase risk. Plan: We discussed that will trial a 10d course of doxycycline and have her follow up in a week. Return precautions including fever, chills, worsening erythema, drainage, or new wounds discussed.

## 2019-11-24 NOTE — Patient Instructions (Signed)
I have sent a prescription for doxycycline to the [facility] pharmacy. If your symptoms worsen or you develop fevers or chills, please return for further evaluation.

## 2019-11-25 MED FILL — OLMESARTAN-HCTZ 20-12.5 MG: 20-12.5 | 30 days supply | Qty: 30 | Fill #9

## 2019-11-28 ENCOUNTER — Other Ambulatory Visit: Payer: Self-pay | Admitting: Internal Medicine

## 2019-11-29 ENCOUNTER — Other Ambulatory Visit: Payer: Self-pay

## 2019-11-29 ENCOUNTER — Ambulatory Visit (INDEPENDENT_AMBULATORY_CARE_PROVIDER_SITE_OTHER): Payer: No Typology Code available for payment source | Admitting: Licensed Clinical Social Worker

## 2019-11-29 ENCOUNTER — Encounter: Payer: Self-pay | Admitting: Licensed Clinical Social Worker

## 2019-11-29 ENCOUNTER — Ambulatory Visit: Payer: No Typology Code available for payment source | Admitting: Licensed Clinical Social Worker

## 2019-11-29 DIAGNOSIS — F411 Generalized anxiety disorder: Secondary | ICD-10-CM

## 2019-11-29 IMAGING — MG DIGITAL SCREENING BILATERAL MAMMOGRAM WITH CAD
5 series · 5 of 5 positions shown · non-contrast
Comparison: Previous exam(s).

ACR Breast Density Category a: The breast tissue is almost entirely
fatty.

CLINICAL DATA: Screening.

EXAM:
DIGITAL SCREENING BILATERAL MAMMOGRAM WITH CAD

[L CC]
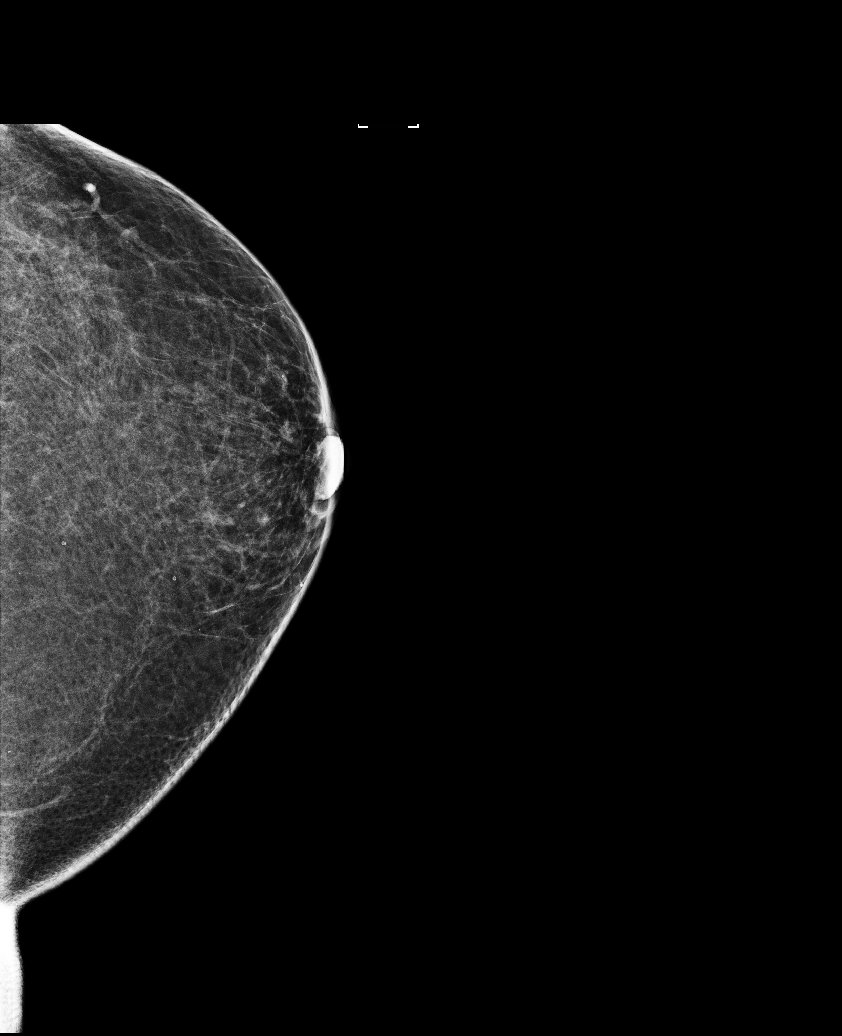

[R CC]
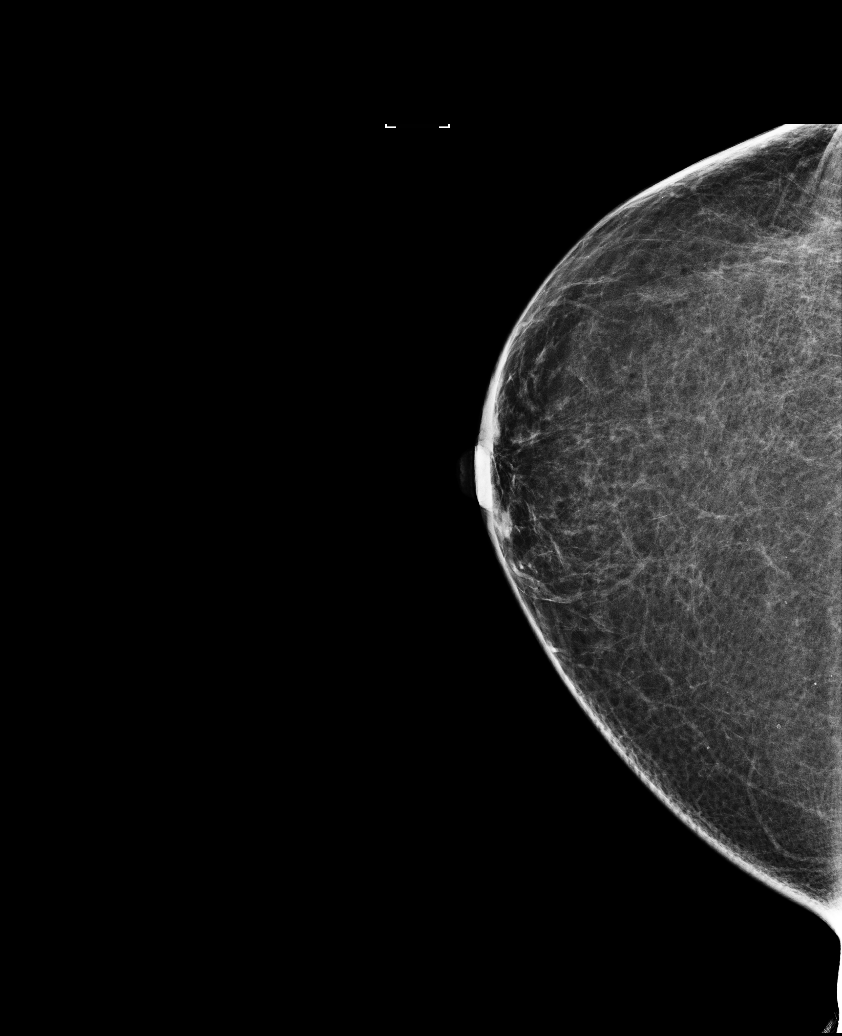

[R MLO (1 of 2)]
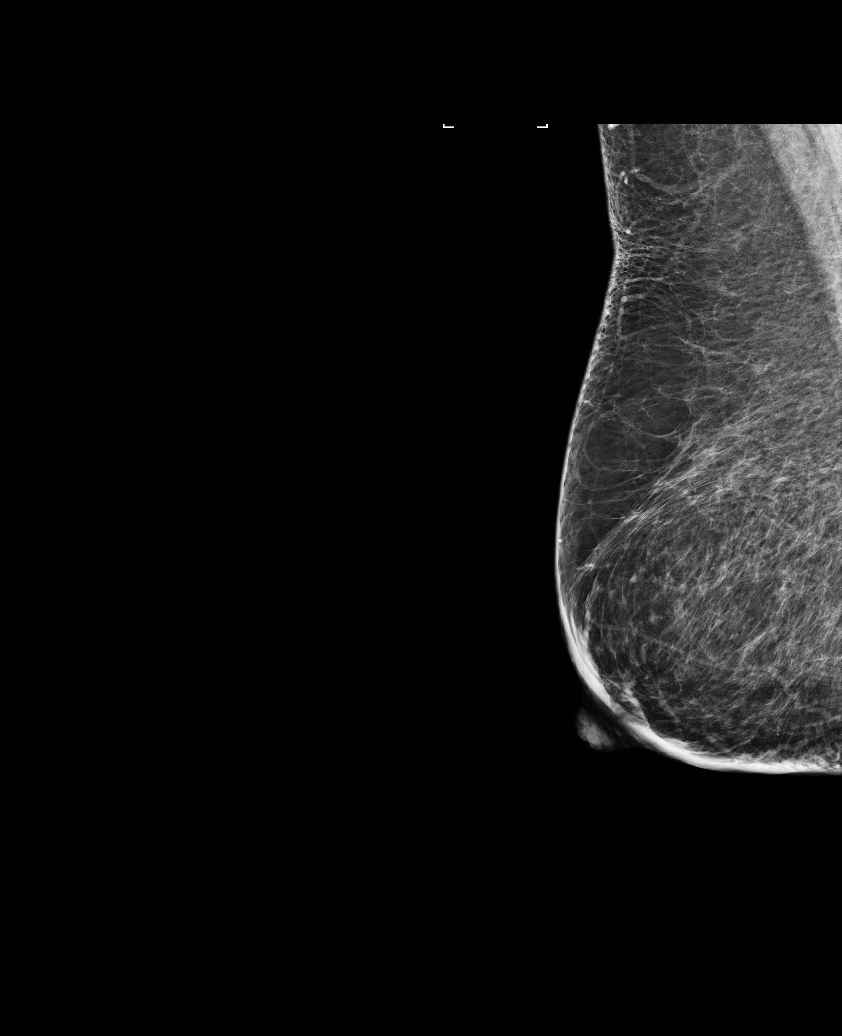

[L MLO]
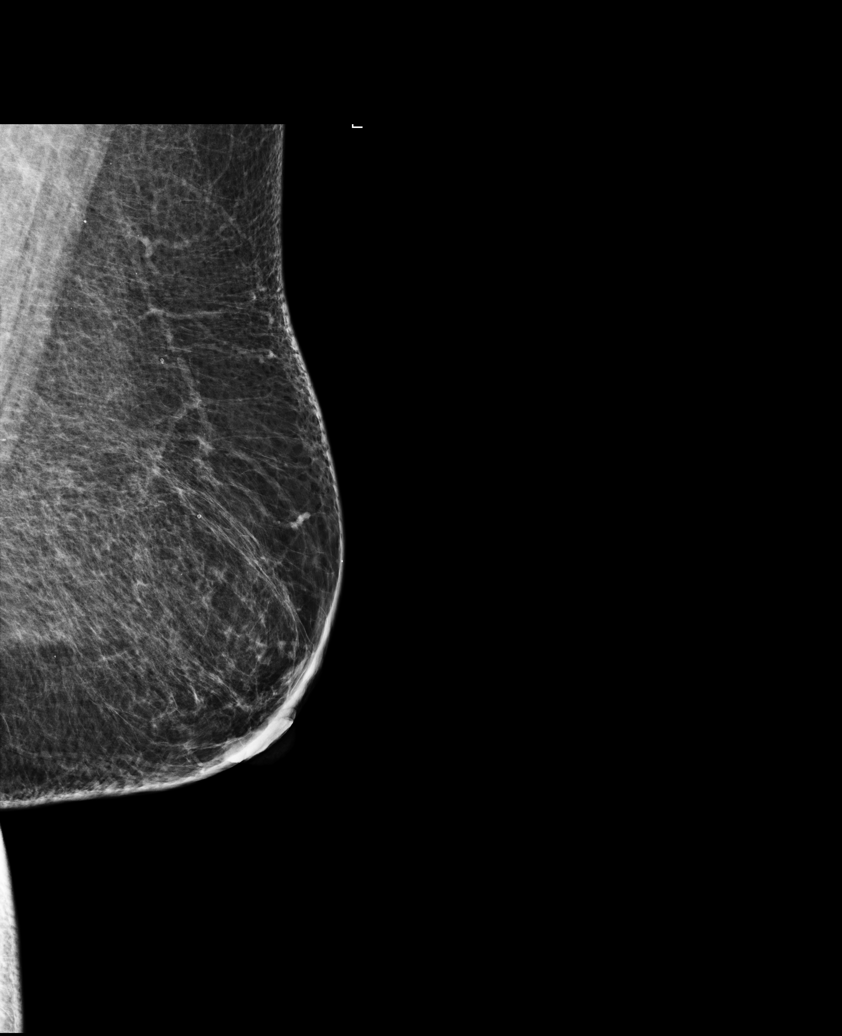

[R MLO (2 of 2)]
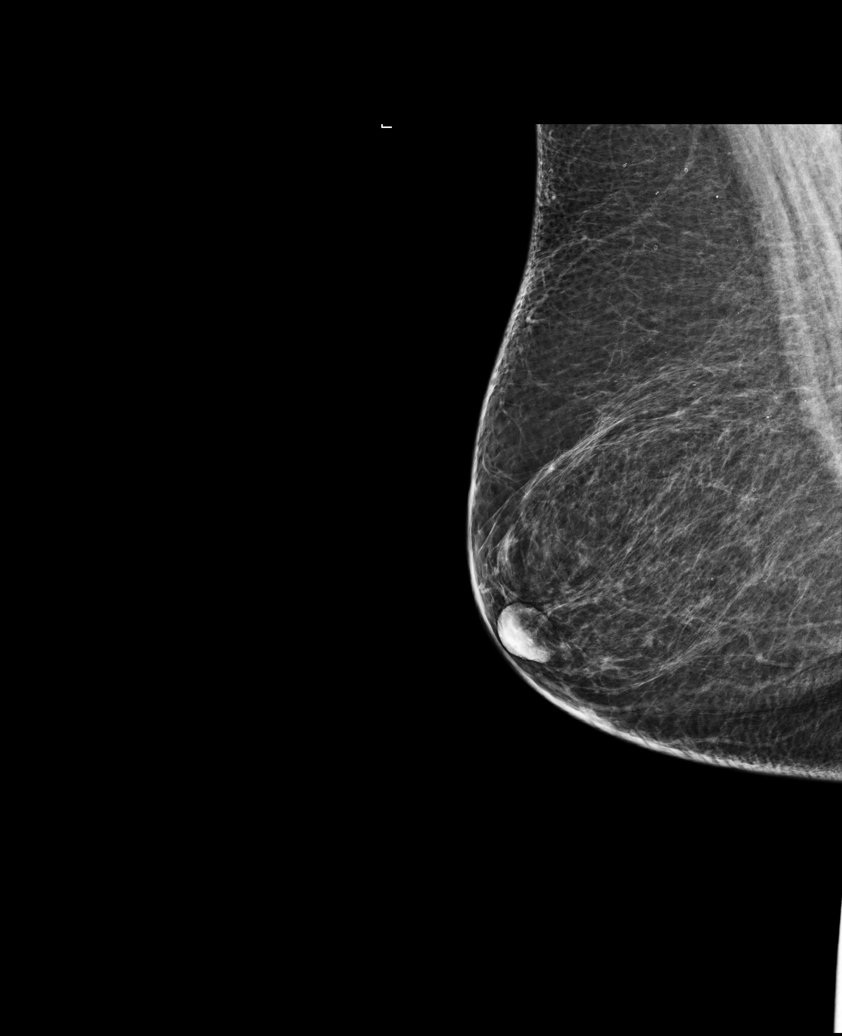

[5 of 5 positions shown; findings below may reference images not displayed]

FINDINGS: There are no findings suspicious for malignancy. Images were
processed with CAD.
IMPRESSION: No mammographic evidence of malignancy. A result letter of this
screening mammogram will be mailed directly to the patient.

RECOMMENDATION:
Screening mammogram in one year. (Code:[id number])

BI-RADS CATEGORY  1: Negative.

## 2019-11-29 NOTE — BH Specialist Note (Signed)
Integrated Behavioral Health Visit via Telemedicine (Telephone)  11/29/2019 Kelly Gallagher [id number]   Session Start time: 9:05  Session End time: 9:30 Total time: 25 minutes  Type of Service: Individual Visit Type: Telephonic Referring Provider: Dr. Barbaraann Faster  Patient location: Work [facility] Provider location: Office All persons participating in visit: Patient and [facility]  Discussed confidentiality: Yes   "By engaging in this telephone visit, you consent to the provision of healthcare.  Additionally, you authorize for your insurance to be billed for the services provided during this telephone visit."   Confirmed patient's address: Yes  Confirmed patient's phone number: Yes  Any changes to demographics: No   The following statements were read to the patient and/or legal guardian that are established with the [facility] Provider.  "The purpose of this phone visit is to provide behavioral health care while limiting exposure to the coronavirus (COVID19).  There is a possibility of technology failure and discussed alternative modes of communication if that failure occurs."  Patient and/or legal guardian consented to telephone visit: Yes   PRESENTING CONCERNS: Patient and/or family reports the following symptoms/concerns: difficulty with setting boundaries with family members, anxiety, and mild sadness. Mild sleep disturbances and panic symptoms. Duration of problem: over one year; Severity of problem: mild  STRENGTHS (Protective Factors/Coping Skills): Social connections, Social and Emotional competence, Concrete supports in place (healthy food, safe environments, etc.) and Sense of purpose  ASSESSMENT: Patient currently experiencing mild levels of anxiety and depression. Patient reported the Cymbalta is helping to reduce her symptoms. Patient has made progress with establishing boundaries with her family members. Patient is beginning to take healthy steps back, and displaying less levels  of co-dependency.    GOALS ADDRESSED: Patient will: 1.  Reduce symptoms of: anxiety, depression and stress  2.  Increase knowledge and/or ability of: coping skills, healthy habits and stress reduction  3.  Demonstrate ability to: Increase healthy adjustment to current life circumstances, Increase adequate support systems for patient/family and Increase motivation to adhere to plan of care  Progress of Goals: Ongoing  INTERVENTIONS: Interventions utilized:  Motivational Interviewing, Brief CBT and Supportive Counseling Standardized Assessments completed & reviewed: Not Needed   OUTCOME: Patient Response: Patient established a goal of focusing on her health and managing her diabetes. Patient acknowledged she is a "stress eater". Patient is working with her spouse to prepare for purchasing a home in the future. Patient is improving her self-care, and ability to focus on herself and future goals.    PLAN: 1. Follow up with behavioral health clinician on : transiting to our [facility] clinician, Joni Reining due to my departure from [facility].  2. Referral(s): [facility] [employer] (In Clinic), Rayburn Ma Terry, [facility], LCAS

## 2019-11-30 ENCOUNTER — Other Ambulatory Visit: Payer: Self-pay | Admitting: *Deleted

## 2019-11-30 ENCOUNTER — Other Ambulatory Visit: Payer: Self-pay | Admitting: Student

## 2019-11-30 DIAGNOSIS — I10 Essential (primary) hypertension: Secondary | ICD-10-CM

## 2019-11-30 MED ORDER — METOPROLOL SUCCINATE ER 100 MG PO TB24: 100.0000 mg | ORAL_TABLET | Freq: Every day | ORAL | 3 refills | Status: DC

## 2019-11-30 MED FILL — METOPROLOL SUCCINATE ER 100: 100 | 90 days supply | Qty: 90 | Fill #0

## 2019-12-01 ENCOUNTER — Other Ambulatory Visit: Payer: Self-pay | Admitting: Internal Medicine

## 2019-12-04 ENCOUNTER — Other Ambulatory Visit: Payer: Self-pay | Admitting: Internal Medicine

## 2019-12-04 ENCOUNTER — Ambulatory Visit: Payer: No Typology Code available for payment source | Admitting: Family Medicine

## 2019-12-04 MED FILL — DULOXETINE HCL 60 MG CPEP: 60 | 30 days supply | Qty: 30 | Fill #0

## 2019-12-05 ENCOUNTER — Other Ambulatory Visit: Payer: Self-pay | Admitting: Internal Medicine

## 2019-12-06 NOTE — Progress Notes (Signed)
DOS 11/24/19:  Internal Medicine Clinic Attending  Case discussed with Dr. Ephriam Knuckles  At the time of the visit.  We reviewed the resident's history and exam and pertinent patient test results.  I agree with the assessment, diagnosis, and plan of care documented in the resident's note.

## 2019-12-07 MED FILL — BASAGLAR 100 UNIT/ML KWIKPE: 100 | 30 days supply | Qty: 30 | Fill #3

## 2019-12-08 ENCOUNTER — Encounter: Payer: Self-pay | Admitting: Internal Medicine

## 2019-12-08 ENCOUNTER — Ambulatory Visit (INDEPENDENT_AMBULATORY_CARE_PROVIDER_SITE_OTHER): Payer: No Typology Code available for payment source | Admitting: Internal Medicine

## 2019-12-08 ENCOUNTER — Other Ambulatory Visit: Payer: Self-pay

## 2019-12-08 DIAGNOSIS — L0292 Furuncle, unspecified: Secondary | ICD-10-CM | POA: Diagnosis not present

## 2019-12-08 DIAGNOSIS — Z0000 Encounter for general adult medical examination without abnormal findings: Secondary | ICD-10-CM

## 2019-12-09 NOTE — Assessment & Plan Note (Addendum)
Here for 2w f/u for wound recheck. I saw her back on 9/3 for a groin wound at which time I gave her a 10d course of doxycyline.  She has completed this at today's visit and has no further issues. On exam, it appears well healed with so inlammatory findings. Plan: no further management at this time. Instructed to keep area dry and clean.

## 2019-12-09 NOTE — Assessment & Plan Note (Signed)
Recommend she arrange a separate appt for management of her chronic medical conditions.

## 2019-12-09 NOTE — Progress Notes (Signed)
Office Visit   Patient ID: Kelly Gallagher, female    DOB: 16-Oct-1970, 49 y.o.   MRN: [id number]  Subjective:  CC: wound follow up  HPI 49 y.o. presents today for follow up from Kelly Gallagher visit with me two weeks ago for a groin wound at which time I gave Kelly Gallagher a 10d course of doxycycline. Today, Kelly Gallagher notes that the wound is almost completely healed and has had no further issues with it. Denies fevers, chills or other systemic symptoms.      ACTIVE MEDICATIONS   Current Outpatient Medications on File Prior to Visit  Medication Sig Dispense Refill  . Albuterol Sulfate 108 (90 Base) MCG/ACT AEPB Inhale 2 puffs into the lungs every 4 (four) hours as needed. 1 each 4  . ALPRAZolam (XANAX) 0.5 MG tablet TAKE 1 TABLET BY MOUTH ONCE DAILY AS NEEDED FOR ANXIETY 30 tablet 0  . atorvastatin (LIPITOR) 20 MG tablet TAKE 1 TABLET BY MOUTH ONCE A DAY 90 tablet 3  . betamethasone dipropionate 0.05 % cream Apply topically 2 (two) times daily. 30 g 0  . Canagliflozin-metFORMIN HCl ER (INVOKAMET XR) [phone] MG TB24 Take 1 tablet by mouth 2 (two) times daily with a meal. 180 tablet 3  . Continuous Blood Gluc Receiver (FREESTYLE LIBRE 2 READER SYSTM) DEVI 1 each by Does not apply route 4 (four) times daily. 1 each 0  . Continuous Blood Gluc Sensor (FREESTYLE LIBRE 2 SENSOR) MISC 1 application by Does not apply route 4 (four) times daily. 1 each 2  . DULoxetine (CYMBALTA) 60 MG capsule TAKE 1 CAPSULE BY MOUTH DAILY. 30 capsule 2  . Insulin Glargine (BASAGLAR KWIKPEN) 100 UNIT/ML Inject 0.6 mLs (60 Units total) into the skin daily AND 0.4 mLs (40 Units total) at bedtime. 30 mL 3  . Insulin Pen Needle (UNIFINE PENTIPS) 32G X 4 MM MISC Use to inject insulin into the skin 2 times daily 200 each 1  . lidocaine (LIDODERM) 5 % Place 1 patch onto the skin daily. Remove & Discard patch within 12 hours or as directed by MD 10 patch 0  . metoprolol succinate (TOPROL XL) 100 MG 24 hr tablet Take 1 tablet (100 mg total) by mouth  daily. Take with or immediately following a meal. 90 tablet 3  . Multiple Vitamin (MULTIVITAMIN) tablet Take 1 tablet by mouth daily.    . nitroGLYCERIN (NITRODUR - DOSED IN MG/24 HR) 0.2 mg/hr patch Apply 1/4th patch to affected shoulder, change daily 30 patch 1  . olmesartan-hydrochlorothiazide (BENICAR HCT) 20-12.5 MG tablet Take 1 tablet by mouth daily. 180 tablet 3  . Omega-3 Fatty Acids (FISH OIL) 1000 MG CAPS Take by mouth.    Marland Kitchen omeprazole (PRILOSEC) 20 MG capsule TAKE 1 CAPSULE BY MOUTH ONCE A DAY 90 capsule 3  . Probiotic Product (PROBIOTIC-10 PO) Take by mouth.    . Semaglutide, 1 MG/DOSE, 2 MG/1.5ML SOPN Inject 0.75 mLs (1 mg total) into the skin once a week. 3 mL 12  . vitamin B-12 (CYANOCOBALAMIN) 1000 MCG tablet Take by mouth.     No current facility-administered medications on file prior to visit.    ROS  Review of Systems  Constitutional: Negative for chills and fever.  Gastrointestinal: Negative for nausea and vomiting.  Skin: Negative for rash and wound.    Objective:   BP 119/71 (BP Location: Right Arm, Patient Position: Sitting, Cuff Size: Small)   Pulse 64   Temp 98 F (36.7 C) (Oral)   Ht 5'  6" (1.676 m)   Wt 247 lb 1.6 oz (112.1 kg)   SpO2 98%   BMI 39.88 kg/m  Wt Readings from Last 3 Encounters:  12/08/19 247 lb 1.6 oz (112.1 kg)  11/24/19 245 lb (111.1 kg)  11/20/19 245 lb 9.6 oz (111.4 kg)   BP Readings from Last 3 Encounters:  12/08/19 119/71  11/24/19 119/67  11/20/19 104/64   Physical Exam Constitutional:      Appearance: Normal appearance.  Cardiovascular:     Rate and Rhythm: Normal rate.  Pulmonary:     Effort: Pulmonary effort is normal.  Skin:    Comments: Right inguinal fold: Prior wound healing well. Kelly Gallagher does still have a small bump that feels like a lymph node. Other than that, there is no surrounding erythema, edema, draining, or tenderness.     Health Maintenance:   Health Maintenance  Topic Date Due  . Hepatitis C  Screening  Never done  . COVID-19 Vaccine (1) Never done  . HIV Screening  Never done  . TETANUS/TDAP  Never done  . INFLUENZA VACCINE  10/22/2019  . OPHTHALMOLOGY EXAM  11/17/2019  . FOOT EXAM  12/08/2019  . HEMOGLOBIN A1C  05/20/2020  . PAP SMEAR-Modifier  08/23/2021  . PNEUMOCOCCAL POLYSACCHARIDE VACCINE AGE 53-64 HIGH RISK  Completed     Assessment & Plan:   Problem List Items Addressed This Visit      Musculoskeletal and Integument   RESOLVED: Furuncle    Here for 2w f/u for wound recheck. I saw Kelly Gallagher back on 9/3 for a groin wound at which time I gave Kelly Gallagher a 10d course of doxycyline.  Kelly Gallagher has completed this at today's visit and has no further issues. On exam, it appears well healed with so inlammatory findings. Plan: no further management at this time. Instructed to keep area dry and clean.        Other   Healthcare maintenance    Recommend Kelly Gallagher arrange a separate appt for management of Kelly Gallagher chronic medical conditions.           Pt discussed with Dr. Phyllis Ginger, MD Internal Medicine Resident PGY-2 [facility] Pager: [phone] 12/09/2019 11:31 AM

## 2019-12-12 MED FILL — UNIFINE PENTIPS 32GX5/32: 32G X 4 MM | 90 days supply | Qty: 200 | Fill #1

## 2019-12-12 MED FILL — OZEMPIC (1 MG/DOSE) 4 MG/3M: 4 | 28 days supply | Qty: 3 | Fill #1

## 2019-12-13 ENCOUNTER — Other Ambulatory Visit: Payer: Self-pay

## 2019-12-13 DIAGNOSIS — F411 Generalized anxiety disorder: Secondary | ICD-10-CM

## 2019-12-13 MED ORDER — ALPRAZOLAM 0.5 MG PO TABS
ORAL_TABLET | ORAL | 0 refills | Status: DC
Start: 2019-12-13 — End: 2020-01-11

## 2019-12-13 MED FILL — ALPRAZolam 0.5 MG TABS: 0.5 | 30 days supply | Qty: 30 | Fill #0

## 2019-12-13 NOTE — Progress Notes (Signed)
Internal Medicine Clinic Attending  Case discussed with Dr. Christian  At the time of the visit.  We reviewed the resident's history and exam and pertinent patient test results.  I agree with the assessment, diagnosis, and plan of care documented in the resident's note.  

## 2019-12-13 NOTE — Assessment & Plan Note (Signed)
30d supply of 0.5mg  xanax refilled. Please consider obtaining UDS at next OV

## 2019-12-15 ENCOUNTER — Other Ambulatory Visit: Payer: Self-pay | Admitting: Internal Medicine

## 2019-12-15 MED FILL — FREESTYLE LIBRE 2 SENSOR MI: 28 days supply | Qty: 2 | Fill #0

## 2019-12-19 ENCOUNTER — Other Ambulatory Visit: Payer: Self-pay

## 2019-12-19 ENCOUNTER — Ambulatory Visit: Payer: No Typology Code available for payment source | Attending: Family Medicine

## 2019-12-19 DIAGNOSIS — M6281 Muscle weakness (generalized): Secondary | ICD-10-CM | POA: Diagnosis present

## 2019-12-19 DIAGNOSIS — M25612 Stiffness of left shoulder, not elsewhere classified: Secondary | ICD-10-CM | POA: Insufficient documentation

## 2019-12-19 DIAGNOSIS — G8929 Other chronic pain: Secondary | ICD-10-CM | POA: Diagnosis present

## 2019-12-19 DIAGNOSIS — M25512 Pain in left shoulder: Secondary | ICD-10-CM | POA: Diagnosis present

## 2019-12-20 NOTE — Therapy (Signed)
[facility] Outpatient Rehabilitation [facility] [address] [place], [place], [address] Phone: [phone]   Fax:  [phone]  Physical Therapy Evaluation  Patient Details  Name: ALESANDRA SMART MRN: [id number] Date of Birth: [birth year] Referring Provider (PT): Lenda Kelp, MD   Encounter Date: 12/19/2019   PT End of Session - 12/20/19 1114    Visit Number 1    Number of Visits 13    Date for PT Re-Evaluation 02/09/20    Authorization Type [place] FOCUS    PT Start Time 1700    PT Stop Time 1754    PT Time Calculation (min) 54 min    Activity Tolerance Patient tolerated treatment well    Behavior During Therapy [facility] for tasks assessed/performed           Past Medical History:  Diagnosis Date  . Diabetes mellitus without complication (HCC)   . Hypertension     Past Surgical History:  Procedure Laterality Date  . CHOLECYSTECTOMY    . DILATION AND CURETTAGE OF UTERUS      There were no vitals filed for this visit.    Subjective Assessment - 12/19/19 1716    Subjective L shoulder pain has been an issue for about an year. It  was improving until pt fell out of bed in July.    Limitations Lifting;House hold activities    How long can you sit comfortably? Not an issue    How long can you stand comfortably? 5-76mins    How long can you walk comfortably? 5-10 mins    Diagnostic tests IMPRESSION:Intact and normal appearing rotator cuff and long head of biceps. Mild to moderate acromioclavicular osteoarthritis. Mild subacromialspurring also noted. Small volume of subacromial/subdeltoid suggestive of bursitis.    Currently in Pain? Yes    Pain Score 3    highest 8/10   Pain Location Shoulder    Pain Orientation Lateral;Right    Pain Descriptors / Indicators Aching;Sharp    Pain Type Chronic pain    Aggravating Factors  repepitive    Pain Relieving Factors rest, meds, ice/heat              [facility] PT Assessment - 12/20/19 0001      Assessment    Medical Diagnosis Pain in joint of left shoulder    Referring Provider (PT) Pearletha Forge, Azucena Fallen, MD    Onset Date/Surgical Date --   1 year   Hand Dominance Right    Prior Therapy no      Precautions   Precautions None      Restrictions   Weight Bearing Restrictions No      Balance Screen   Has the patient fallen in the past 6 months Yes   OOB   How many times? 1    Has the patient had a decrease in activity level because of a fear of falling?  No    Is the patient reluctant to leave their home because of a fear of falling?  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      [employer]  Overall Cognitive Status Within Functional Limits for tasks assessed      Observation/Other Assessments   Focus on Therapeutic Outcomes (FOTO)  54% limitation      Sensation   Light Touch Appears Intact      Posture/Postural Control   Posture/Postural Control Postural limitations    Postural Limitations Forward head;Rounded Shoulders      ROM / Strength   AROM / PROM / Strength AROM;PROM;Strength      AROM   AROM Assessment Site Shoulder    Right/Left Shoulder Left    Left Shoulder Flexion 85 Degrees   limited by pain     PROM   PROM Assessment Site Shoulder    Right/Left Shoulder Left    Left Shoulder Flexion 120 Degrees   limited by pain vs. jt restriction     Strength   Overall Strength Comments L shoulder movements were generalllt 4/5-4+/5 in strength c abd, ER, IR provoking pain      Palpation   Palpation comment Tender along the lateral GH jt line      Special Tests    Special Tests Rotator Cuff Impingement;Biceps/Labral Tests    Rotator Cuff Impingment tests  Leanord Asal test;Empty Can test;Full Can test      Hawkins-Kennedy test   Findings Positive    Side Left    Comments pain      Empty Can test   Findings Positive    Side Left    Comment pain and weakness      Full Can test   Findings Positive    Side Left    Comment pain                      Objective measurements completed on examination: See above findings.       [facility] Adult PT Treatment/Exercise - 12/20/19 0001      Exercises   Exercises Shoulder      Shoulder Exercises: Seated   Retraction Strengthening;Both;10 reps    Theraband Level (Shoulder Retraction) Level 2 (Red)      Shoulder Exercises: ROM/Strengthening   Pendulum 10x; abd/add      Shoulder Exercises: Stretch   Other Shoulder Stretches Upper trap/GH jt distraction 3x; 20 sec                  PT Education - 12/20/19 1113    Education Details Eval findings, POC, HEP, RICE for pain management, sleeping positions for comfort    Person(s) Educated Patient    Methods Explanation;Demonstration;Tactile cues;Verbal cues;Handout    Comprehension Verbalized understanding;Returned demonstration;Verbal cues required;Tactile cues required;Need further instruction            PT Short Term Goals - 12/20/19 1124      PT SHORT TERM GOAL #1   Title Pt will be Ind c an initial HEP    Baseline Started on eval    Status New    Target Date 01/10/20      PT SHORT TERM GOAL #2   Title Review the results of FOTO survey    Status New    Target Date 01/03/20      PT SHORT TERM GOAL #3   Title Pt will voice understanding of measures for pain reduction and management    Status New    Target Date 01/10/20             PT Long Term Goals - 12/20/19 1126      PT LONG TERM  GOAL #1   Title Improve L sh flexion AROM to 130d of greater for improved functional use of the L UE    Baseline 85d    Status New    Target Date 02/09/20      PT LONG TERM GOAL #2   Title pt will report improved  L shoulder pain range with daily activities to 3/10 or less.    Baseline 3-8/10    Status New    Target Date 02/09/20      PT LONG TERM GOAL #3   Title Pt will report improved ability to lift a 1lb can of soup to shoulder height as not being difficulty    Baseline Some difficulty    Status New    Target Date 02/09/20      PT LONG TERM GOAL #4   Title Pt's FOTO score will improved to 36% limitation    Baseline 54% limitation    Status New    Target Date 02/09/20      PT LONG TERM GOAL #5   Title Pt will be Ind in a final HEP to improve the functional use of her L UE.    Status New    Target Date 02/09/20                  Plan - 12/20/19 1111    Clinical Impression Statement Pt presents c a 1 year Hx of L shoulder pain. AROM and PROM seem to be primarily limited by pain with L UE elevation with an empty endfeel. Rotator cuff and impingement tests were positive for pain c empty can also being positive for weakness. Pt will benefit from PT 2w6 to address posture, R shoulder strength and ROM, R shoulder pain to improve pt's functional use of her L UE.    Personal Factors and Comorbidities Comorbidity 2    Comorbidities diabetes, obesity    Examination-Activity Limitations Reach Overhead;Carry;Lift    Clinical Decision Making Low    Rehab Potential Good    PT Frequency 2x / week    PT Duration 6 weeks    PT Treatment/Interventions ADLs/Self Care Home Management;Cryotherapy;Electrical Stimulation;Ultrasound;Moist Heat;Iontophoresis 4mg /ml Dexamethasone;Therapeutic activities;Therapeutic exercise;Manual techniques;Patient/family education;Passive range of motion;Dry needling;Taping    PT Next Visit Plan Assess response to HEP; progress ther ex and useof modalities as indicated    PT Home Exercise Plan [id number]. scapular retactions c RTB, upper trap/GH jy distraction stretch, pedulum abd/add    Consulted and Agree with Plan of Care Patient           Patient will benefit from  skilled therapeutic intervention in order to improve the following deficits and impairments:  Decreased strength, Postural dysfunction, Pain, Obesity, Decreased range of motion  Visit Diagnosis: Chronic left shoulder pain  Decreased ROM of left shoulder  Muscle weakness (generalized)     Problem List Patient Active Problem List   Diagnosis Date Noted  . Tendinopathy of left rotator cuff 09/18/2019  . Impingement syndrome of left shoulder 06/26/2019  . Vaginal candidiasis 04/25/2019  . Musculoskeletal pain 04/29/2018  . Healthcare maintenance 04/29/2018  . DM (diabetes mellitus) type II controlled, 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HCC) 04/28/2018  . Hypertension 04/28/2018  . Generalized anxiety disorder 04/28/2018  . Hyperlipidemia 04/28/2018  . Morbid obesity (HCC) 04/28/2018  . IUD (intrauterine device) in place 04/28/2018  . GERD (gastroesophageal reflux disease) 04/28/2018  . Reactive airway disease 04/28/2018  . Vitamin B12 deficiency 04/28/2018   06/27/2018 MS, PT 12/20/19 1:30 PM  [facility] [address] [place], [place], [address] Phone: [phone]   Fax:  [phone]  Name: Carol AdaJerri L Recupero MRN: [id number] Date of Birth: 10/09/1970

## 2019-12-25 MED FILL — OLMESARTAN-HCTZ 20-12.5 MG: 20-12.5 | 30 days supply | Qty: 30 | Fill #10

## 2019-12-29 MED FILL — DULOXETINE HCL 60 MG CPEP: 60 | 30 days supply | Qty: 30 | Fill #1

## 2019-12-31 ENCOUNTER — Ambulatory Visit (HOSPITAL_COMMUNITY): Payer: Self-pay

## 2020-01-03 MED FILL — OMEPRAZOLE 20 MG CAP: 20 | 90 days supply | Qty: 90 | Fill #3

## 2020-01-03 MED FILL — ATORVASTATIN CALCIUM 20 MG: 20 | 90 days supply | Qty: 90 | Fill #3

## 2020-01-06 MED FILL — BASAGLAR 100 UNIT/ML KWIKPE: 100 | 40 days supply | Qty: 30 | Fill #1

## 2020-01-09 MED FILL — OZEMPIC (1 MG/DOSE) 4 MG/3M: 4 | 28 days supply | Qty: 3 | Fill #2

## 2020-01-11 ENCOUNTER — Other Ambulatory Visit: Payer: Self-pay | Admitting: Student

## 2020-01-11 ENCOUNTER — Other Ambulatory Visit: Payer: Self-pay | Admitting: Internal Medicine

## 2020-01-11 MED FILL — ALPRAZolam 0.5 MG TABS: 0.5 | 30 days supply | Qty: 30 | Fill #0

## 2020-01-12 ENCOUNTER — Other Ambulatory Visit: Payer: Self-pay | Admitting: Internal Medicine

## 2020-01-12 MED FILL — FREESTYLE LIBRE 2 SENSOR MI: 28 days supply | Qty: 2 | Fill #0

## 2020-01-16 ENCOUNTER — Ambulatory Visit: Payer: No Typology Code available for payment source | Attending: Family Medicine | Admitting: Physical Therapy

## 2020-01-16 ENCOUNTER — Other Ambulatory Visit: Payer: Self-pay | Admitting: Internal Medicine

## 2020-01-16 ENCOUNTER — Other Ambulatory Visit: Payer: Self-pay

## 2020-01-16 ENCOUNTER — Other Ambulatory Visit: Payer: Self-pay | Admitting: Student

## 2020-01-16 ENCOUNTER — Encounter: Payer: Self-pay | Admitting: Physical Therapy

## 2020-01-16 DIAGNOSIS — M6281 Muscle weakness (generalized): Secondary | ICD-10-CM | POA: Insufficient documentation

## 2020-01-16 DIAGNOSIS — M25612 Stiffness of left shoulder, not elsewhere classified: Secondary | ICD-10-CM | POA: Insufficient documentation

## 2020-01-16 DIAGNOSIS — G8929 Other chronic pain: Secondary | ICD-10-CM | POA: Insufficient documentation

## 2020-01-16 DIAGNOSIS — M25512 Pain in left shoulder: Secondary | ICD-10-CM | POA: Insufficient documentation

## 2020-01-16 MED ORDER — BETAMETHASONE DIPROPIONATE 0.05 % EX CREA
TOPICAL_CREAM | Freq: Two times a day (BID) | CUTANEOUS | 0 refills | Status: DC
Start: 2020-01-16 — End: 2020-01-16

## 2020-01-16 MED FILL — BETAMETHASONE DP 0.05% CRM: 0.05 | 10 days supply | Qty: 30 | Fill #0

## 2020-01-16 MED FILL — NITROGLYCERIN 0.2 MG/HR PTC: 0.2 | 72 days supply | Qty: 18 | Fill #2

## 2020-01-16 NOTE — Therapy (Signed)
[facility] Outpatient Rehabilitation [facility] [address] [place], [place], [address] Phone: [phone]   Fax:  [phone]  Physical Therapy Treatment  Patient Details  Name: Kelly Gallagher MRN: [id number] Date of Birth: 12/01/1970 Referring Provider (PT): Lenda Kelp, MD   Encounter Date: 01/16/2020   PT End of Session - 01/16/20 1716    Visit Number 2    Number of Visits 13    Date for PT Re-Evaluation 02/09/20    Authorization Type [place] FOCUS    PT Start Time 1716    PT Stop Time 1756    PT Time Calculation (min) 40 min    Activity Tolerance Patient tolerated treatment well    Behavior During Therapy [facility] for tasks assessed/performed           Past Medical History:  Diagnosis Date  . Diabetes mellitus without complication (HCC)   . Hypertension     Past Surgical History:  Procedure Laterality Date  . CHOLECYSTECTOMY    . DILATION AND CURETTAGE OF UTERUS      There were no vitals filed for this visit.   Subjective Assessment - 01/16/20 1719    Subjective I have noticed a little more ROM. I did a 90 min massage last week which helped. I also had dental surgery so I am on 800 mg ibuprofen.    Diagnostic tests IMPRESSION:Intact and normal appearing rotator cuff and long head of biceps. Mild to moderate acromioclavicular osteoarthritis. Mild subacromialspurring also noted. Small volume of subacromial/subdeltoid suggestive of bursitis.    Currently in Pain? Yes    Pain Score 2     Pain Location Shoulder    Pain Orientation Left    Pain Descriptors / Indicators Tender                             [facility] Adult PT Treatment/Exercise - 01/16/20 0001      Shoulder Exercises: Seated   Retraction Limitations scapular retraction with resting posture    Other Seated Exercises scapular retraction for resting posture    Other Seated Exercises chin tucks      Shoulder Exercises: Stretch   Other Shoulder Stretches upper trap      Other Shoulder Stretches thoracic extension over back of chair      Manual Therapy   Manual Therapy Joint mobilization;Soft tissue mobilization    Manual therapy comments skilled palpation and monitoring during TPDN    Joint Mobilization Lt first rib    Soft tissue mobilization Rt upper trap & infraspinatus            Trigger Point Dry Needling - 01/16/20 0001    Consent Given? Yes    Education Handout Provided --   verbal education   Muscles Treated Head and Neck Upper trapezius    Muscles Treated Upper Quadrant Infraspinatus    Upper Trapezius Response Twitch reponse elicited;Palpable increased muscle length   Lt   Infraspinatus Response Twitch response elicited;Palpable increased muscle length   Lt               PT Education - 01/16/20 1724    Education Details theracane, frequency of posture changes at work, anatomy of condition, TPDN & expected outcomes    Person(s) Educated Patient    Methods Explanation;Demonstration;Tactile cues;Verbal cues    Comprehension Verbalized understanding;Returned demonstration;Verbal cues required;Tactile cues required;Need further instruction  PT Short Term Goals - 01/16/20 1726      PT SHORT TERM GOAL #1   Title Pt will be Ind c an initial HEP    Status Achieved      PT SHORT TERM GOAL #2   Title Review the results of FOTO survey    Status Achieved      PT SHORT TERM GOAL #3   Title Pt will voice understanding of measures for pain reduction and management    Status Achieved             PT Long Term Goals - 12/20/19 1126      PT LONG TERM GOAL #1   Title Improve L sh flexion AROM to 130d of greater for improved functional use of the L UE    Baseline 85d    Status New    Target Date 02/09/20      PT LONG TERM GOAL #2   Title pt will report improved L shoulder pain range with daily activities to 3/10 or less.    Baseline 3-8/10    Status New    Target Date 02/09/20      PT LONG TERM GOAL #3   Title  Pt will report improved ability to lift a 1lb can of soup to shoulder height as not being difficulty    Baseline Some difficulty    Status New    Target Date 02/09/20      PT LONG TERM GOAL #4   Title Pt's FOTO score will improved to 36% limitation    Baseline 54% limitation    Status New    Target Date 02/09/20      PT LONG TERM GOAL #5   Title Pt will be Ind in a final HEP to improve the functional use of her L UE.    Status New    Target Date 02/09/20                 Plan - 01/16/20 1756    Clinical Impression Statement Pt had excellent outcome from TPDN to upper trap. Most focus of exercises and education was for a stacked posture since she is so used to leaning forward resulting in a forward head posture. Created impingement with movement of GHJ without scapular retraction. Discussed benefits of frequent motion, standing desk and theracane for self care.    PT Treatment/Interventions ADLs/Self Care Home Management;Cryotherapy;Electrical Stimulation;Ultrasound;Moist Heat;Iontophoresis 4mg /ml Dexamethasone;Therapeutic activities;Therapeutic exercise;Manual techniques;Patient/family education;Passive range of motion;Dry needling;Taping    PT Next Visit Plan TPDN PRN, cont training upright posture/scap retraction to reduce impingement- consider prone exercises    PT Home Exercise Plan [id number]. scapular retactions c RTB, upper trap/GH jy distraction stretch, pedulum abd/add    Consulted and Agree with Plan of Care Patient           Patient will benefit from skilled therapeutic intervention in order to improve the following deficits and impairments:  Decreased strength, Postural dysfunction, Pain, Obesity, Decreased range of motion  Visit Diagnosis: Chronic left shoulder pain  Decreased ROM of left shoulder  Muscle weakness (generalized)     Problem List Patient Active Problem List   Diagnosis Date Noted  . Tendinopathy of left rotator cuff 09/18/2019  .  Impingement syndrome of left shoulder 06/26/2019  . Vaginal candidiasis 04/25/2019  . Musculoskeletal pain 04/29/2018  . Healthcare maintenance 04/29/2018  . DM (diabetes mellitus) type II controlled, 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HCC) 04/28/2018  . Hypertension 04/28/2018  . Generalized anxiety disorder 04/28/2018  .  Hyperlipidemia 04/28/2018  . Morbid obesity (HCC) 04/28/2018  . IUD (intrauterine device) in place 04/28/2018  . GERD (gastroesophageal reflux disease) 04/28/2018  . Reactive airway disease 04/28/2018  . Vitamin B12 deficiency 04/28/2018   Yatziri Wainwright C. Yasmyn Bellisario PT, DPT 01/16/20 5:59 PM   [facility] Outpatient Rehabilitation [facility] [address] [place], [place], [address] Phone: [phone]   Fax:  [phone]  Name: AMANY RANDO MRN: [id number] Date of Birth: 1970/12/01

## 2020-01-18 ENCOUNTER — Other Ambulatory Visit: Payer: Self-pay

## 2020-01-18 ENCOUNTER — Ambulatory Visit: Payer: No Typology Code available for payment source

## 2020-01-18 DIAGNOSIS — M25512 Pain in left shoulder: Secondary | ICD-10-CM

## 2020-01-18 DIAGNOSIS — M6281 Muscle weakness (generalized): Secondary | ICD-10-CM

## 2020-01-18 DIAGNOSIS — M25612 Stiffness of left shoulder, not elsewhere classified: Secondary | ICD-10-CM

## 2020-01-18 DIAGNOSIS — G8929 Other chronic pain: Secondary | ICD-10-CM

## 2020-01-18 NOTE — Therapy (Signed)
[facility] Outpatient Rehabilitation [facility] [address] [place], [place], [address] Phone: [phone]   Fax:  [phone]  Physical Therapy Treatment  Patient Details  Name: Kelly Gallagher MRN: [id number] Date of Birth: 11/09/1970 Referring Provider (PT): Lenda Kelp, MD   Encounter Date: 01/18/2020   PT End of Session - 01/18/20 1927    Visit Number 3    Number of Visits 13    Date for PT Re-Evaluation 02/09/20    Authorization Type [place] FOCUS    PT Start Time 1834    PT Stop Time 1922    PT Time Calculation (min) 48 min    Activity Tolerance Patient tolerated treatment well    Behavior During Therapy [facility] for tasks assessed/performed           Past Medical History:  Diagnosis Date  . Diabetes mellitus without complication (HCC)   . Hypertension     Past Surgical History:  Procedure Laterality Date  . CHOLECYSTECTOMY    . DILATION AND CURETTAGE OF UTERUS      There were no vitals filed for this visit.   Subjective Assessment - 01/18/20 1925    Subjective Pt reports her L shoulder pain continues to be low with improved movement.    Currently in Pain? Yes    Pain Score 2     Pain Location Shoulder    Pain Orientation Left    Pain Descriptors / Indicators Tender    Pain Type Other (Comment)    Pain Onset More than a month ago    Pain Frequency Constant                             [facility] Adult PT Treatment/Exercise - 01/18/20 0001      Exercises   Exercises Shoulder      Shoulder Exercises: Standing   Protraction 10 reps    Protraction Limitations Wall serratus push up    Extension Both;15 reps    Theraband Level (Shoulder Extension) Level 2 (Red)    Row Both;15 reps    Theraband Level (Shoulder Row) Level 2 (Red)    Other Standing Exercises Scapular retraction 5x, 5 sec at wall    Other Standing Exercises Cervical traction 5x, 5 sec at wall       Shoulder Exercises: Stretch   Other Shoulder Stretches  Upper trap/GH jt distraction 3x; 20 sec    Other Shoulder Stretches 60d pec stretch, 3x, 20 sec                  PT Education - 01/18/20 1926    Education Details Posture. HEP progressed. Use of theracane.    Person(s) Educated Patient    Methods Explanation;Demonstration;Tactile cues;Verbal cues;Handout    Comprehension Verbalized understanding;Returned demonstration;Verbal cues required;Tactile cues required            PT Short Term Goals - 01/16/20 1726      PT SHORT TERM GOAL #1   Title Pt will be Ind c an initial HEP    Status Achieved      PT SHORT TERM GOAL #2   Title Review the results of FOTO survey    Status Achieved      PT SHORT TERM GOAL #3   Title Pt will voice understanding of measures for pain reduction and management    Status Achieved             PT  Long Term Goals - 12/20/19 1126      PT LONG TERM GOAL #1   Title Improve L sh flexion AROM to 130d of greater for improved functional use of the L UE    Baseline 85d    Status New    Target Date 02/09/20      PT LONG TERM GOAL #2   Title pt will report improved L shoulder pain range with daily activities to 3/10 or less.    Baseline 3-8/10    Status New    Target Date 02/09/20      PT LONG TERM GOAL #3   Title Pt will report improved ability to lift a 1lb can of soup to shoulder height as not being difficulty    Baseline Some difficulty    Status New    Target Date 02/09/20      PT LONG TERM GOAL #4   Title Pt's FOTO score will improved to 36% limitation    Baseline 54% limitation    Status New    Target Date 02/09/20      PT LONG TERM GOAL #5   Title Pt will be Ind in a final HEP to improve the functional use of her L UE.    Status New    Target Date 02/09/20                 Plan - 01/18/20 1928    Clinical Impression Statement Pt has obtained a theracane and reviewed use and techniques for massage. Duscussed he roll of poor posture with aggrevating her R shoulder and  the importance of improving posture. Exs were also provided and completed for posterior shoulder chain strengthening and anterior shoulder and chest stretching. PT tolerated the session well.    Personal Factors and Comorbidities Comorbidity 2    Comorbidities diabetes, obesity    Examination-Activity Limitations Reach Overhead;Carry;Lift    Stability/Clinical Decision Making Stable/Uncomplicated    Clinical Decision Making Low    Rehab Potential Good    PT Frequency 2x / week    PT Duration 6 weeks    PT Treatment/Interventions ADLs/Self Care Home Management;Cryotherapy;Electrical Stimulation;Ultrasound;Moist Heat;Iontophoresis 4mg /ml Dexamethasone;Therapeutic activities;Therapeutic exercise;Manual techniques;Patient/family education;Passive range of motion;Dry needling;Taping    PT Next Visit Plan Assess response to HEP. TPDN PRN, cont training upright posture/scap retraction to reduce impingement- consider prone exercises    PT Home Exercise Plan [id number]. scapular retactions c RTB, upper trap/GH jy distraction stretch, pedulum abd/add    Consulted and Agree with Plan of Care Patient           Patient will benefit from skilled therapeutic intervention in order to improve the following deficits and impairments:  Decreased strength, Postural dysfunction, Pain, Obesity, Decreased range of motion  Visit Diagnosis: Chronic left shoulder pain  Decreased ROM of left shoulder  Muscle weakness (generalized)     Problem List Patient Active Problem List   Diagnosis Date Noted  . Tendinopathy of left rotator cuff 09/18/2019  . Impingement syndrome of left shoulder 06/26/2019  . Vaginal candidiasis 04/25/2019  . Musculoskeletal pain 04/29/2018  . Healthcare maintenance 04/29/2018  . DM (diabetes mellitus) type II controlled, 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HCC) 04/28/2018  . Hypertension 04/28/2018  . Generalized anxiety disorder 04/28/2018  . Hyperlipidemia 04/28/2018  . Morbid obesity  (HCC) 04/28/2018  . IUD (intrauterine device) in place 04/28/2018  . GERD (gastroesophageal reflux disease) 04/28/2018  . Reactive airway disease 04/28/2018  . Vitamin B12 deficiency 04/28/2018   06/27/2018 MS, PT  01/18/20 7:43 PM   [facility] Out[facility] [address] [place], [place], [address] Phone: [phone]   Fax:  [phone]  Name: SONNI BARSE MRN: [id number] Date of Birth: 1970/10/26

## 2020-01-20 ENCOUNTER — Other Ambulatory Visit: Payer: Self-pay | Admitting: Student

## 2020-01-23 ENCOUNTER — Ambulatory Visit: Payer: No Typology Code available for payment source | Admitting: Physical Therapy

## 2020-01-25 ENCOUNTER — Ambulatory Visit: Payer: No Typology Code available for payment source | Admitting: Physical Therapy

## 2020-01-25 ENCOUNTER — Other Ambulatory Visit: Payer: Self-pay | Admitting: *Deleted

## 2020-01-25 ENCOUNTER — Other Ambulatory Visit: Payer: Self-pay | Admitting: Student

## 2020-01-25 DIAGNOSIS — I10 Essential (primary) hypertension: Secondary | ICD-10-CM

## 2020-01-25 MED ORDER — OLMESARTAN MEDOXOMIL-HCTZ 20-12.5 MG PO TABS: 1.0000 | ORAL_TABLET | Freq: Every day | ORAL | 3 refills | Status: DC

## 2020-01-25 MED FILL — OLMESARTAN-HCTZ 20-12.5 MG: 20-12.5 | 90 days supply | Qty: 90 | Fill #0

## 2020-01-27 MED FILL — DULOXETINE HCL 60 MG CPEP: 60 | 30 days supply | Qty: 30 | Fill #2

## 2020-01-30 ENCOUNTER — Other Ambulatory Visit: Payer: Self-pay

## 2020-01-30 ENCOUNTER — Encounter: Payer: Self-pay | Admitting: Physical Therapy

## 2020-01-30 ENCOUNTER — Ambulatory Visit: Payer: No Typology Code available for payment source | Attending: Family Medicine | Admitting: Physical Therapy

## 2020-01-30 DIAGNOSIS — M6281 Muscle weakness (generalized): Secondary | ICD-10-CM | POA: Diagnosis present

## 2020-01-30 DIAGNOSIS — M25512 Pain in left shoulder: Secondary | ICD-10-CM | POA: Insufficient documentation

## 2020-01-30 DIAGNOSIS — M25612 Stiffness of left shoulder, not elsewhere classified: Secondary | ICD-10-CM | POA: Insufficient documentation

## 2020-01-30 DIAGNOSIS — G8929 Other chronic pain: Secondary | ICD-10-CM | POA: Insufficient documentation

## 2020-01-30 NOTE — Therapy (Signed)
[facility] Outpatient Rehabilitation [facility] [address] [place], [place], [address] Phone: [phone]   Fax:  [phone]  Physical Therapy Treatment  Patient Details  Name: Kelly Gallagher MRN: [id number] Date of Birth: 09/12/1970 Referring Provider (PT): Lenda Kelp, MD   Encounter Date: 01/30/2020   PT End of Session - 01/30/20 1802    Visit Number 4    Number of Visits 13    Date for PT Re-Evaluation 02/09/20    Authorization Type [place] FOCUS    PT Start Time 1715    PT Stop Time 1800    PT Time Calculation (min) 45 min    Activity Tolerance Patient tolerated treatment well    Behavior During Therapy [facility] for tasks assessed/performed           Past Medical History:  Diagnosis Date  . Diabetes mellitus without complication (HCC)   . Hypertension     Past Surgical History:  Procedure Laterality Date  . CHOLECYSTECTOMY    . DILATION AND CURETTAGE OF UTERUS      There were no vitals filed for this visit.   Subjective Assessment - 01/30/20 1719    Subjective I started feeling my Rt shoulder in last few days and Lt upper trap region is sore. The next day after DN I have noticed a wider range of motions. Less pain with grabbing for seatbelt.    Currently in Pain? Yes    Pain Score 3     Pain Location Shoulder    Pain Orientation Left    Pain Descriptors / Indicators Sore    Aggravating Factors  reaching back is sore but better, sleeping is better                             [facility] Adult PT Treatment/Exercise - 01/30/20 0001      Modalities   Modalities Ultrasound      Ultrasound   Ultrasound Location Lt GHJ bursa    Ultrasound Parameters .8 w/cm2 pulsed    Ultrasound Goals Pain      Manual Therapy   Manual therapy comments skilled palpation and monitoring during TPDN    Joint Mobilization GHJ AP at end range ER, inf at end range flexion, thoracic PA, GHJ distraction    Soft tissue mobilization bil upper trap,  Lt deltoid            Trigger Point Dry Needling - 01/30/20 0001    Muscles Treated Head and Neck Levator scapulae    Muscles Treated Upper Quadrant Deltoid    Upper Trapezius Response Twitch reponse elicited;Palpable increased muscle length   bil   Levator Scapulae Response Twitch response elicited;Palpable increased muscle length   right   Deltoid Response Twitch response elicited;Palpable increased muscle length   Lt- ant & mid                 PT Short Term Goals - 01/16/20 1726      PT SHORT TERM GOAL #1   Title Pt will be Ind c an initial HEP    Status Achieved      PT SHORT TERM GOAL #2   Title Review the results of FOTO survey    Status Achieved      PT SHORT TERM GOAL #3   Title Pt will voice understanding of measures for pain reduction and management    Status Achieved  PT Long Term Goals - 12/20/19 1126      PT LONG TERM GOAL #1   Title Improve L sh flexion AROM to 130d of greater for improved functional use of the L UE    Baseline 85d    Status New    Target Date 02/09/20      PT LONG TERM GOAL #2   Title pt will report improved L shoulder pain range with daily activities to 3/10 or less.    Baseline 3-8/10    Status New    Target Date 02/09/20      PT LONG TERM GOAL #3   Title Pt will report improved ability to lift a 1lb can of soup to shoulder height as not being difficulty    Baseline Some difficulty    Status New    Target Date 02/09/20      PT LONG TERM GOAL #4   Title Pt's FOTO score will improved to 36% limitation    Baseline 54% limitation    Status New    Target Date 02/09/20      PT LONG TERM GOAL #5   Title Pt will be Ind in a final HEP to improve the functional use of her L UE.    Status New    Target Date 02/09/20                 Plan - 01/30/20 1806    Clinical Impression Statement DN improved tolerance to reach behind motion. Capsular end feels with impingement and bursitis symptoms. Due to DM I  do not feel comfortable applying ionto prior to going to bed so we utilized [place] to decrease irriation at bursa and subacromial space.    PT Treatment/Interventions ADLs/Self Care Home Management;Cryotherapy;Electrical Stimulation;Ultrasound;Moist Heat;Iontophoresis 4mg /ml Dexamethasone;Therapeutic activities;Therapeutic exercise;Manual techniques;Patient/family education;Passive range of motion;Dry needling;Taping    PT Next Visit Plan prone exercises, DN PRN, cont shoulder mobs to tolerance    PT Home Exercise Plan [id number]. scapular retactions c RTB, upper trap/GH jy distraction stretch, pedulum abd/add    Consulted and Agree with Plan of Care Patient           Patient will benefit from skilled therapeutic intervention in order to improve the following deficits and impairments:  Decreased strength, Postural dysfunction, Pain, Obesity, Decreased range of motion  Visit Diagnosis: Chronic left shoulder pain  Decreased ROM of left shoulder  Muscle weakness (generalized)     Problem List Patient Active Problem List   Diagnosis Date Noted  . Tendinopathy of left rotator cuff 09/18/2019  . Impingement syndrome of left shoulder 06/26/2019  . Vaginal candidiasis 04/25/2019  . Musculoskeletal pain 04/29/2018  . Healthcare maintenance 04/29/2018  . DM (diabetes mellitus) type II controlled, 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HCC) 04/28/2018  . Hypertension 04/28/2018  . Generalized anxiety disorder 04/28/2018  . Hyperlipidemia 04/28/2018  . Morbid obesity (HCC) 04/28/2018  . IUD (intrauterine device) in place 04/28/2018  . GERD (gastroesophageal reflux disease) 04/28/2018  . Reactive airway disease 04/28/2018  . Vitamin B12 deficiency 04/28/2018    Kelly Gallagher PT, DPT 01/30/20 6:10 PM   [facility] [address] [place], [place], [place] Phone: [phone]   Fax:  [phone]  Name: Kelly Gallagher MRN: [name] Date of  Birth: 1970-11-19

## 2020-02-06 ENCOUNTER — Ambulatory Visit: Payer: No Typology Code available for payment source

## 2020-02-06 ENCOUNTER — Telehealth: Payer: Self-pay | Admitting: *Deleted

## 2020-02-06 MED ORDER — FREESTYLE LIBRE 2 SENSOR MISC: 2 refills | Status: DC

## 2020-02-06 MED FILL — OZEMPIC (1 MG/DOSE) 4 MG/3M: 4 | 28 days supply | Qty: 3 | Fill #3

## 2020-02-06 MED FILL — FREESTYLE LIBRE 2 SENSOR MI: 28 days supply | Qty: 2 | Fill #0

## 2020-02-06 NOTE — Telephone Encounter (Signed)
Done, thank you!

## 2020-02-06 NOTE — Telephone Encounter (Signed)
Received fax from [facility] stating co-pay is cheaper if they dispense 2 Freestyle Libre sensors instead of one. Please resend Rx and change qty to 2. Thank you.Kinnie Feil, BSN, RN-BC

## 2020-02-07 ENCOUNTER — Encounter: Payer: No Typology Code available for payment source | Admitting: Internal Medicine

## 2020-02-16 MED FILL — INVOKAMET XR 150-1,000 MG T: 150-1000 | 90 days supply | Qty: 180 | Fill #1

## 2020-02-21 ENCOUNTER — Ambulatory Visit: Payer: No Typology Code available for payment source

## 2020-02-22 ENCOUNTER — Ambulatory Visit: Payer: No Typology Code available for payment source | Attending: Family Medicine | Admitting: Physical Therapy

## 2020-02-22 ENCOUNTER — Other Ambulatory Visit: Payer: Self-pay

## 2020-02-22 ENCOUNTER — Encounter: Payer: Self-pay | Admitting: Physical Therapy

## 2020-02-22 DIAGNOSIS — G8929 Other chronic pain: Secondary | ICD-10-CM | POA: Diagnosis present

## 2020-02-22 DIAGNOSIS — M25612 Stiffness of left shoulder, not elsewhere classified: Secondary | ICD-10-CM | POA: Diagnosis present

## 2020-02-22 DIAGNOSIS — M25512 Pain in left shoulder: Secondary | ICD-10-CM | POA: Insufficient documentation

## 2020-02-22 DIAGNOSIS — M6281 Muscle weakness (generalized): Secondary | ICD-10-CM | POA: Insufficient documentation

## 2020-02-22 MED FILL — METOPROLOL SUCCINATE ER 100: 100 | 90 days supply | Qty: 90 | Fill #1

## 2020-02-22 NOTE — Therapy (Signed)
[facility] Out[facility] [address] [place], [place], [address] Phone: [phone]   Fax:  [phone]  Physical Therapy Treatment/Re-Certification  Patient Details  Name: Kelly Gallagher MRN: [id number] Date of Birth: 1971/01/30 Referring Provider (PT): Lenda Kelp, MD   Encounter Date: 02/22/2020   PT End of Session - 02/22/20 1843    Visit Number 5    Number of Visits 13    Date for PT Re-Evaluation 03/22/20    Authorization Type  FOCUS    PT Start Time 1842    PT Stop Time 1926    PT Time Calculation (min) 44 min    Activity Tolerance Patient tolerated treatment well    Behavior During Therapy [facility] for tasks assessed/performed           Past Medical History:  Diagnosis Date  . Diabetes mellitus without complication (HCC)   . Hypertension     Past Surgical History:  Procedure Laterality Date  . CHOLECYSTECTOMY    . DILATION AND CURETTAGE OF UTERUS      There were no vitals filed for this visit.   Subjective Assessment - 02/22/20 1844    Subjective More sore in Rt shoulder but I think it is because I was doing more. Did a 90 min massage about a week ago and using theracane. More pain on Rt side of cervical spine. Denies HA or dizziness. I had more ROM but today deltoid is sore, I think i pushed it too far.    Diagnostic tests IMPRESSION:Intact and normal appearing rotator cuff and long head of biceps. Mild to moderate acromioclavicular osteoarthritis. Mild subacromialspurring also noted. Small volume of subacromial/subdeltoid suggestive of bursitis.    Currently in Pain? Yes    Pain Score 3     Pain Location Shoulder    Pain Orientation Left    Pain Descriptors / Indicators Aching    Aggravating Factors  sleeping, reaching, lifting    Pain Relieving Factors rest, ice              [facility] PT Assessment - 02/22/20 0001      Assessment   Medical Diagnosis Pain in joint of left shoulder    Referring Provider  (PT) Lenda Kelp, MD    Onset Date/Surgical Date --   around Sept 2020   Hand Dominance Right      Prior Function   Vocation Full time employment    Vocation Requirements Office work      Cognition   Overall Cognitive Status Within Functional Limits for tasks assessed      Observation/Other Assessments   Focus on Therapeutic Outcomes (FOTO)  61% function      Posture/Postural Control   Posture Comments forward head and rounded shoulders      AROM   Left Shoulder Flexion 126 Degrees    Left Shoulder ABduction 140 Degrees      Strength   Overall Strength Comments Lt shoulder elevation with resisted ER- gross 5/5      Hawkins-Kennedy test   Findings Positive    Side Left      Full Can test   Findings Negative                         [facility] Adult PT Treatment/Exercise - 02/22/20 0001      Shoulder Exercises: Supine   Other Supine Exercises on foam roll: UE scissors, horiz abd, protraction, end range flexion reach  Shoulder Exercises: Seated   Other Seated Exercises hip hinge with scapular retraction      Shoulder Exercises: Sidelying   External Rotation Strengthening;Left    External Rotation Weight (lbs) 2    ABduction Left    ABduction Weight (lbs) 2    ABduction Limitations stop at 90 deg      Shoulder Exercises: Standing   Other Standing Exercises bent over row from table 2lb      Manual Therapy   Joint Mobilization GHJ distraction, GHJ PA mobs    Soft tissue mobilization pec minor, Lt upper trap, Lt mid deltoid                    PT Short Term Goals - 01/16/20 1726      PT SHORT TERM GOAL #1   Title Pt will be Ind c an initial HEP    Status Achieved      PT SHORT TERM GOAL #2   Title Review the results of FOTO survey    Status Achieved      PT SHORT TERM GOAL #3   Title Pt will voice understanding of measures for pain reduction and management    Status Achieved             PT Long Term Goals - 02/22/20 1846        PT LONG TERM GOAL #1   Title Improve L sh flexion AROM to 130d of greater for improved functional use of the L UE    Baseline 126 deg today    Status On-going      PT LONG TERM GOAL #2   Title pt will report improved L shoulder pain range with daily activities to 3/10 or less.    Baseline up to 5 or 6/10    Status On-going      PT LONG TERM GOAL #3   Title Pt will report improved ability to lift a 1lb can of soup to shoulder height as not being difficulty    Status Achieved      PT LONG TERM GOAL #4   Title Pt's FOTO score will improved to 36% limitation      PT LONG TERM GOAL #5   Title Pt will be Ind in a final HEP to improve the functional use of her L UE.    Status On-going                 Plan - 02/22/20 1926    Clinical Impression Statement Reports she can feel a difference in deltoid with DM monitorin in Lt arm. tends to hinge through thoracic spine rather than at hips so we practiced that today. Pain is very consistent with tenderness at bursa and subacromial impingement. will cont to encourage upright posture and continue progress toward goals.    PT Treatment/Interventions ADLs/Self Care Home Management;Cryotherapy;Electrical Stimulation;Ultrasound;Moist Heat;Iontophoresis 4mg /ml Dexamethasone;Therapeutic activities;Therapeutic exercise;Manual techniques;Patient/family education;Passive range of motion;Dry needling;Taping    PT Next Visit Plan cont periscap strengthening with upright thoracic posture    PT Home Exercise Plan [id number].    Consulted and Agree with Plan of Care Patient           Patient will benefit from skilled therapeutic intervention in order to improve the following deficits and impairments:  Decreased strength, Postural dysfunction, Pain, Obesity, Decreased range of motion  Visit Diagnosis: Chronic left shoulder pain - Plan: PT plan of care cert/re-cert  Decreased ROM of left shoulder - Plan: PT plan of  care cert/re-cert  Muscle  weakness (generalized) - Plan: PT plan of care cert/re-cert     Problem List Patient Active Problem List   Diagnosis Date Noted  . Tendinopathy of left rotator cuff 09/18/2019  . Impingement syndrome of left shoulder 06/26/2019  . Vaginal candidiasis 04/25/2019  . Musculoskeletal pain 04/29/2018  . Healthcare maintenance 04/29/2018  . DM (diabetes mellitus) type II controlled, 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HCC) 04/28/2018  . Hypertension 04/28/2018  . Generalized anxiety disorder 04/28/2018  . Hyperlipidemia 04/28/2018  . Morbid obesity (HCC) 04/28/2018  . IUD (intrauterine device) in place 04/28/2018  . GERD (gastroesophageal reflux disease) 04/28/2018  . Reactive airway disease 04/28/2018  . Vitamin B12 deficiency 04/28/2018    Costella Schwarz C. Brittannie Tawney PT, DPT 02/22/20 7:31 PM   [facility] [address] [place], [place], [address] Phone: [phone]   Fax:  [phone]  Name: Kelly Gallagher MRN: [id number] Date of Birth: 1970/07/23

## 2020-02-26 ENCOUNTER — Other Ambulatory Visit: Payer: Self-pay | Admitting: Internal Medicine

## 2020-02-26 MED FILL — DULOXETINE HCL 60 MG CPEP: 60 | 30 days supply | Qty: 30 | Fill #0

## 2020-02-29 MED FILL — FREESTYLE LIBRE 2 SENSOR MI: 28 days supply | Qty: 2 | Fill #1

## 2020-03-05 ENCOUNTER — Encounter: Payer: No Typology Code available for payment source | Admitting: Physical Therapy

## 2020-03-05 MED FILL — OZEMPIC (1 MG/DOSE) 4 MG/3M: 4 | 28 days supply | Qty: 3 | Fill #4

## 2020-03-07 ENCOUNTER — Telehealth: Payer: Self-pay | Admitting: Physical Therapy

## 2020-03-07 ENCOUNTER — Ambulatory Visit: Payer: No Typology Code available for payment source | Admitting: Physical Therapy

## 2020-03-07 NOTE — Telephone Encounter (Signed)
Spoke with pt regarding NS today, just forgot about apt. Will call on Monday for evening appointment next week. Kelly Gallagher PT, DPT 03/07/20 7:37 PM

## 2020-03-11 ENCOUNTER — Other Ambulatory Visit: Payer: Self-pay | Admitting: Student

## 2020-03-11 ENCOUNTER — Other Ambulatory Visit: Payer: Self-pay | Admitting: Internal Medicine

## 2020-03-11 MED FILL — UNIFINE PENTIPS 32GX5/32: 32G X 4 MM | 90 days supply | Qty: 200 | Fill #0

## 2020-03-12 ENCOUNTER — Encounter: Payer: No Typology Code available for payment source | Admitting: Physical Therapy

## 2020-03-14 ENCOUNTER — Encounter: Payer: No Typology Code available for payment source | Admitting: Physical Therapy

## 2020-03-19 ENCOUNTER — Other Ambulatory Visit: Payer: Self-pay

## 2020-03-19 ENCOUNTER — Encounter: Payer: Self-pay | Admitting: Physical Therapy

## 2020-03-19 ENCOUNTER — Ambulatory Visit: Payer: No Typology Code available for payment source | Admitting: Physical Therapy

## 2020-03-19 DIAGNOSIS — M25512 Pain in left shoulder: Secondary | ICD-10-CM

## 2020-03-19 DIAGNOSIS — M25612 Stiffness of left shoulder, not elsewhere classified: Secondary | ICD-10-CM

## 2020-03-19 DIAGNOSIS — G8929 Other chronic pain: Secondary | ICD-10-CM

## 2020-03-19 DIAGNOSIS — M6281 Muscle weakness (generalized): Secondary | ICD-10-CM

## 2020-03-19 NOTE — Therapy (Signed)
[place], [place], [address] Phone: [phone]   Fax:  [phone]  Physical Therapy Treatment/Re-evaluation  Patient Details  Name: Kelly Gallagher MRN: [id number] Date of Birth: 1970-09-06 Referring Provider (PT): Dene Gentry, MD   Encounter Date: 03/19/2020   PT End of Session - 03/19/20 1759    Visit Number 6    Number of Visits 13    Date for PT Re-Evaluation 05/04/20    Authorization Type [place] FOCUS    PT Start Time 2423    PT Stop Time 1841    PT Time Calculation (min) 45 min    Activity Tolerance Patient tolerated treatment well    Behavior During Therapy [facility] for tasks assessed/performed           Past Medical History:  Diagnosis Date  . Diabetes mellitus without complication ([place])   . Hypertension     Past Surgical History:  Procedure Laterality Date  . CHOLECYSTECTOMY    . DILATION AND CURETTAGE OF UTERUS      There were no vitals filed for this visit.   Subjective Assessment - 03/19/20 1759    Subjective I was able to don a bra behind my back, I can raise my arm much better, weight bearing is much better. I do still get some pain here-rubbing in deltoid-advil and tylenol kind of help. I have not used voltaren gel in weeks.    Diagnostic tests IMPRESSION:Intact and normal appearing rotator cuff and long head of biceps. Mild to moderate acromioclavicular osteoarthritis. Mild subacromialspurring also noted. Small volume of subacromial/subdeltoid suggestive of bursitis.              [facility] PT Assessment - 03/19/20 0001      Assessment   Medical Diagnosis Pain in joint of left shoulder    Referring Provider (PT) Dene Gentry, MD    Onset Date/Surgical Date --   around Sept 2020   Hand Dominance Right      Cognition   Overall Cognitive Status Within Functional Limits for tasks assessed      Observation/Other Assessments   Focus on Therapeutic Outcomes (FOTO)   70% fuction      Posture/Postural Control   Posture Comments forward head and rounded shoulders      AROM   Left Shoulder Flexion 136 Degrees    Left Shoulder ABduction 144 Degrees      Strength   Overall Strength Comments gross 5/5      Hawkins-Kennedy test   Findings Negative      Empty Can test   Findings Negative      Full Can test   Findings Negative                         [facility] Adult PT Treatment/Exercise - 03/19/20 0001      Shoulder Exercises: Supine   Flexion Left;15 reps    Theraband Level (Shoulder Flexion) Level 1 (Yellow)      Shoulder Exercises: Standing   Other Standing Exercises low trap liftoff      Manual Therapy   Manual therapy comments skilled palpation and monitoring during TPDN    Soft tissue mobilization Lt deltoid            Trigger Point Dry Needling - 03/19/20 0001    Upper Trapezius Response Palpable increased muscle length;Twitch reponse elicited   Lt   Deltoid Response Twitch response elicited;Palpable increased muscle  length   Lt               PT Education - 03/19/20 1913    Education Details goals, POC, HEP, FOTO    Person(s) Educated Patient    Methods Explanation;Demonstration;Tactile cues;Verbal cues;Handout    Comprehension Verbalized understanding;Returned demonstration;Verbal cues required;Tactile cues required;Need further instruction            PT Short Term Goals - 01/16/20 1726      PT SHORT TERM GOAL #1   Title Pt will be Ind c an initial HEP    Status Achieved      PT SHORT TERM GOAL #2   Title Review the results of FOTO survey    Status Achieved      PT SHORT TERM GOAL #3   Title Pt will voice understanding of measures for pain reduction and management    Status Achieved             PT Long Term Goals - 03/19/20 1811      PT LONG TERM GOAL #1   Title Improve L sh flexion AROM to 130d of greater for improved functional use of the L UE    Status Achieved      PT LONG TERM  GOAL #2   Title pt will report improved L shoulder pain range with daily activities to 3/10 or less.    Baseline 2/10    Status Achieved      PT LONG TERM GOAL #3   Title Pt will report improved ability to lift a 1lb can of soup to shoulder height as not being difficulty    Status Achieved      PT LONG TERM GOAL #4   Title Pt's FOTO score will improved to 36% limitation    Status Achieved      PT LONG TERM GOAL #5   Title Pt will be Ind in a final HEP to improve the functional use of her L UE.    Baseline not as frequent as I like but I try to correct my posture    Status On-going      Additional Long Term Goals   Additional Long Term Goals Yes      PT LONG TERM GOAL #6   Title will be able to be independent in reaching items on high shelf in closet & get stoneware down from above the fridge    Baseline asks husband    Time 6    Period Weeks    Status New    Target Date 05/04/20      PT LONG TERM GOAL #7   Title pt will find that she uses her Lt UE at least 50% of the time in functional activities    Baseline maybe 15% due to compensations using Rt UE.    Time 6    Period Weeks    Status New    Target Date 05/04/20                 Plan - 03/19/20 1845    Clinical Impression Statement Pt has made excellent progress considering her overall strength and flexibility. she has met her goals that were set but would like to see some more improvements in functional endurance and overhead strength. Pt reported feeling much better following DN today and added OH strength to HEP. will extend POC by 6 weeks to continue toward functional long term goals.    PT Treatment/Interventions ADLs/Self Care Home Management;Cryotherapy;Dealer  Stimulation;Ultrasound;Moist Heat;Iontophoresis 73m/ml Dexamethasone;Therapeutic activities;Therapeutic exercise;Manual techniques;Patient/family education;Passive range of motion;Dry needling;Taping    PT Next Visit Plan DN PRN, overhead  strength/endurance. change bands to weights    PT Home Exercise Plan [id number].    Consulted and Agree with Plan of Care Patient           Patient will benefit from skilled therapeutic intervention in order to improve the following deficits and impairments:  Decreased strength,Postural dysfunction,Pain,Obesity,Decreased range of motion  Visit Diagnosis: Chronic left shoulder pain - Plan: PT plan of care cert/re-cert  Decreased ROM of left shoulder - Plan: PT plan of care cert/re-cert  Muscle weakness (generalized) - Plan: PT plan of care cert/re-cert     Problem List Patient Active Problem List   Diagnosis Date Noted  . Tendinopathy of left rotator cuff 09/18/2019  . Impingement syndrome of left shoulder 06/26/2019  . Vaginal candidiasis 04/25/2019  . Musculoskeletal pain 04/29/2018  . Healthcare maintenance 04/29/2018  . DM (diabetes mellitus) type II controlled, 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[place] 04/28/2018  . Hypertension 04/28/2018  . Generalized anxiety disorder 04/28/2018  . Hyperlipidemia 04/28/2018  . Morbid obesity ([place] 04/28/2018  . IUD (intrauterine device) in place 04/28/2018  . GERD (gastroesophageal reflux disease) 04/28/2018  . Reactive airway disease 04/28/2018  . Vitamin B12 deficiency 04/28/2018    Kelly Gallagher C. Amarie Tarte PT, DPT 03/19/20 7:31 PM   [facility] [address]Phone: [phone]  Fax:  [phone] Name: Kelly HERSKOWITZMRN: [id number] of Birth: 3Jul 06, 1972

## 2020-03-21 ENCOUNTER — Encounter: Payer: No Typology Code available for payment source | Admitting: Physical Therapy

## 2020-03-22 MED FILL — DULOXETINE HCL 60 MG CPEP: 60 | 30 days supply | Qty: 30 | Fill #1

## 2020-03-27 MED FILL — FREESTYLE LIBRE 2 SENSOR MI: 28 days supply | Qty: 2 | Fill #2

## 2020-03-28 ENCOUNTER — Other Ambulatory Visit: Payer: Self-pay | Admitting: Family Medicine

## 2020-03-28 ENCOUNTER — Encounter: Payer: Self-pay | Admitting: Internal Medicine

## 2020-03-28 ENCOUNTER — Other Ambulatory Visit: Payer: Self-pay

## 2020-03-28 ENCOUNTER — Ambulatory Visit (INDEPENDENT_AMBULATORY_CARE_PROVIDER_SITE_OTHER): Payer: No Typology Code available for payment source | Admitting: Internal Medicine

## 2020-03-28 VITALS — BP 122/81 | HR 66 | Temp 98.0°F | Ht 66.0 in | Wt 239.9 lb

## 2020-03-28 DIAGNOSIS — E1142 Type 2 diabetes mellitus with diabetic polyneuropathy: Secondary | ICD-10-CM | POA: Diagnosis not present

## 2020-03-28 DIAGNOSIS — B354 Tinea corporis: Secondary | ICD-10-CM | POA: Diagnosis not present

## 2020-03-28 DIAGNOSIS — I10 Essential (primary) hypertension: Secondary | ICD-10-CM

## 2020-03-28 DIAGNOSIS — Z794 Long term (current) use of insulin: Secondary | ICD-10-CM

## 2020-03-28 LAB — POCT GLYCOSYLATED HEMOGLOBIN (HGB A1C): Hemoglobin A1C: 7.9 % — AB (ref 4.0–5.6)

## 2020-03-28 LAB — GLUCOSE, CAPILLARY: Glucose-Capillary: 160 mg/dL — ABNORMAL HIGH (ref 70–99)

## 2020-03-28 MED ORDER — FLUCONAZOLE 150 MG PO TABS: 150.0000 mg | ORAL_TABLET | ORAL | 1 refills | Status: AC

## 2020-03-28 MED ORDER — NITROGLYCERIN 0.2 MG/HR TD PT24: MEDICATED_PATCH | TRANSDERMAL | 0 refills | Status: DC

## 2020-03-28 MED FILL — NITROGLYCERIN 0.2 MG/HR PTC: 0.2 | 88 days supply | Qty: 22 | Fill #0

## 2020-03-28 MED FILL — FLUCONAZOLE 150 MG TABS: 150 | 28 days supply | Qty: 4 | Fill #0

## 2020-03-28 NOTE — Patient Instructions (Signed)
Kelly Gallagher, It was great seeing you today! Glad things are going well.   Your A1C is showing nice improvement: 8.7 to 7.9! We will continue your current medications as prescribed.   For your rash, since it is in multiple places I think it would be better to do Fluconazole once weekly for 4 weeks. I have sent this in to your pharmacy.   Take care and we'll see you in 3 months!  Dr. Chesley Mires

## 2020-03-28 NOTE — Progress Notes (Signed)
Established Patient Office Visit  Subjective:  Patient ID: Kelly Gallagher, female    DOB: 1970/04/16  Age: 50 y.o. MRN: [id number]  CC:  Chief Complaint  Patient presents with  . Follow-up    See new doctor   . Rash    Under breast    HPI Kelly Gallagher presents for follow-up on chronic type II DM, HTN and for acute concern of a rash in multiple skin folds. Please see problem based charting for details on today's visit.   Past Medical History:  Diagnosis Date  . Diabetes mellitus without complication (HCC)   . Hypertension     Past Surgical History:  Procedure Laterality Date  . CHOLECYSTECTOMY    . DILATION AND CURETTAGE OF UTERUS      Family History  Problem Relation Age of Onset  . Cancer Father        Head and neck cancer. Has PEG  . Lupus Sister   . Breast cancer Other   . Breast cancer Other     Social History   Socioeconomic History  . Marital status: Married    Spouse name: Not on file  . Number of children: Not on file  . Years of education: Not on file  . Highest education level: Not on file  Occupational History  . Not on file  Tobacco Use  . Smoking status: Never Smoker  . Smokeless tobacco: Never Used  . Tobacco comment: lives with smokers   Vaping Use  . Vaping Use: Never used  Substance and Sexual Activity  . Alcohol use: Not Currently    Comment: Rarely.  . Drug use: Not on file  . Sexual activity: Not on file  Other Topics Concern  . Not on file  Social History Narrative  . Not on file   Social Determinants of Health   Financial Resource Strain: Not on file  Food Insecurity: Not on file  Transportation Needs: Not on file  Physical Activity: Not on file  Stress: Not on file  Social Connections: Not on file  Intimate Partner Violence: Not on file    Outpatient Medications Prior to Visit  Medication Sig Dispense Refill  . ALPRAZolam (XANAX) 0.5 MG tablet TAKE 1 TABLET BY MOUTH ONCE DAILY AS NEEDED FOR ANXIETY 30 tablet 0  .  DULoxetine (CYMBALTA) 60 MG capsule TAKE 1 CAPSULE BY MOUTH DAILY. 30 capsule 5  . UNIFINE PENTIPS 32G X 4 MM MISC USE TO INJECT INSULIN INTO THE SKIN 2 TIMES DAILY 200 each 1  . Albuterol Sulfate 108 (90 Base) MCG/ACT AEPB Inhale 2 puffs into the lungs every 4 (four) hours as needed. 1 each 4  . atorvastatin (LIPITOR) 20 MG tablet TAKE 1 TABLET BY MOUTH ONCE A DAY 90 tablet 3  . betamethasone dipropionate 0.05 % cream Apply topically 2 (two) times daily. 30 g 0  . Canagliflozin-metFORMIN HCl ER (INVOKAMET XR) [phone] MG TB24 Take 1 tablet by mouth 2 (two) times daily with a meal. 180 tablet 3  . Continuous Blood Gluc Receiver (FREESTYLE LIBRE 2 READER SYSTM) DEVI 1 each by Does not apply route 4 (four) times daily. 1 each 0  . Continuous Blood Gluc Sensor (FREESTYLE LIBRE 2 SENSOR) MISC USE TO CHECK BLOOD SUGAR FOUR TIMES DAILY. CHANGE SENSOR EVERY 14 DAYS AS DIRECTED 2 each 2  . Insulin Glargine (BASAGLAR KWIKPEN) 100 UNIT/ML Inject 0.6 mLs (60 Units total) into the skin daily AND 0.4 mLs (40 Units total) at bedtime. 30  mL 3  . lidocaine (LIDODERM) 5 % Place 1 patch onto the skin daily. Remove & Discard patch within 12 hours or as directed by MD 10 patch 0  . metoprolol succinate (TOPROL XL) 100 MG 24 hr tablet Take 1 tablet (100 mg total) by mouth daily. Take with or immediately following a meal. 90 tablet 3  . Multiple Vitamin (MULTIVITAMIN) tablet Take 1 tablet by mouth daily.    . nitroGLYCERIN (NITRODUR - DOSED IN MG/24 HR) 0.2 mg/hr patch Apply 1/4th patch to affected shoulder, change daily 30 patch 0  . olmesartan-hydrochlorothiazide (BENICAR HCT) 20-12.5 MG tablet Take 1 tablet by mouth daily. 180 tablet 3  . Omega-3 Fatty Acids (FISH OIL) 1000 MG CAPS Take by mouth.    Marland Kitchen omeprazole (PRILOSEC) 20 MG capsule TAKE 1 CAPSULE BY MOUTH ONCE A DAY 90 capsule 3  . Probiotic Product (PROBIOTIC-10 PO) Take by mouth.    . Semaglutide, 1 MG/DOSE, 2 MG/1.5ML SOPN Inject 0.75 mLs (1 mg total) into the  skin once a week. 3 mL 12  . vitamin B-12 (CYANOCOBALAMIN) 1000 MCG tablet Take by mouth.     No facility-administered medications prior to visit.    Allergies  Allergen Reactions  . Ace Inhibitors Anaphylaxis  . Effexor Xr [Venlafaxine Hcl]     Patient states caused symptomatic elevated serotonin levels    ROS Review of Systems  Constitutional: Negative for activity change, chills, fever and unexpected weight change.  HENT: Negative for hearing loss, sinus pressure and trouble swallowing.   Eyes: Negative for visual disturbance.  Respiratory: Negative for cough and shortness of breath.   Cardiovascular: Negative for chest pain, palpitations and leg swelling.  Gastrointestinal: Negative for abdominal pain, diarrhea, nausea and vomiting.  Endocrine: Negative for polydipsia and polyuria.  Genitourinary: Negative for dysuria and vaginal discharge.  Musculoskeletal: Negative for gait problem and joint swelling.  Skin: Positive for rash.  Neurological: Negative for dizziness and headaches.  Psychiatric/Behavioral: Negative for sleep disturbance.      Objective:    Physical Exam Constitutional:      General: She is not in acute distress.    Appearance: Normal appearance.  Eyes:     Conjunctiva/sclera: Conjunctivae normal.  Cardiovascular:     Rate and Rhythm: Normal rate and regular rhythm.     Pulses: Normal pulses.  Pulmonary:     Effort: Pulmonary effort is normal.     Breath sounds: Normal breath sounds.  Abdominal:     General: There is no distension.     Palpations: Abdomen is soft.     Tenderness: There is no abdominal tenderness.  Musculoskeletal:     Cervical back: Neck supple.     Right lower leg: No edema.     Left lower leg: No edema.  Lymphadenopathy:     Cervical: No cervical adenopathy.  Skin:    Findings: Rash present. Rash is macular.     Comments: Erythematous macular rash under breasts, panus and in groin with mild skin peeling   Neurological:      Mental Status: She is alert.     Sensory: No sensory deficit.  Psychiatric:        Mood and Affect: Mood normal.        Behavior: Behavior normal.     BP 122/81 (BP Location: Left Arm, Patient Position: Sitting, Cuff Size: Large)   Pulse 66   Temp 98 F (36.7 C) (Oral)   Ht 5\' 6"  (1.676 m)   Wt  239 lb 14.4 oz (108.8 kg)   LMP 03/07/2020   SpO2 100% Comment: room air  BMI 38.72 kg/m  Wt Readings from Last 3 Encounters:  03/28/20 239 lb 14.4 oz (108.8 kg)  12/08/19 247 lb 1.6 oz (112.1 kg)  11/24/19 245 lb (111.1 kg)     Health Maintenance Due  Topic Date Due  . Hepatitis C Screening  Never done  . COVID-19 Vaccine (1) Never done  . HIV Screening  Never done  . TETANUS/TDAP  Never done  . COLONOSCOPY (Pts 45-73yrs Insurance coverage will need to be confirmed)  Never done  . OPHTHALMOLOGY EXAM  11/17/2019  . FOOT EXAM  12/08/2019    There are no preventive care reminders to display for this patient.  No results found for: TSH Lab Results  Component Value Date   WBC 9.3 11/20/2019   HGB 15.4 11/20/2019   HCT 45.2 11/20/2019   MCV 86 11/20/2019   PLT 307 11/20/2019   Lab Results  Component Value Date   NA 139 11/20/2019   K 4.4 11/20/2019   CO2 21 11/20/2019   GLUCOSE 222 (H) 11/20/2019   BUN 13 11/20/2019   CREATININE 0.66 11/20/2019   CALCIUM 9.7 11/20/2019   ANIONGAP 10 04/04/2019   Lab Results  Component Value Date   CHOL 117 11/20/2019   Lab Results  Component Value Date   HDL 47 11/20/2019   Lab Results  Component Value Date   LDLCALC 40 11/20/2019   Lab Results  Component Value Date   TRIG 187 (H) 11/20/2019   Lab Results  Component Value Date   CHOLHDL 2.5 11/20/2019   Lab Results  Component Value Date   HGBA1C 7.9 (A) 03/28/2020      Assessment & Plan:   Problem List Items Addressed This Visit      Cardiovascular and Mediastinum   Hypertension (Chronic)    This problem is chronic and stable. No adverse side effects from  medications.  Continue current doses of Metoprolol and Benicar. BMP checked at previous visit showed stable renal function and electrolytes.         Endocrine   DM (diabetes mellitus) type II controlled, 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HCC) - Primary (Chronic)    A1C has improved to 7.9 today, down from 8.7 at last visit. She attributes some of her elevated readings to the stress surrounding some dental work she's having done. Reviewing her meter, she has been in target range a little over 80% of the time. She also plans to get back on track with her eating after the holidays. She finds weight watchers to be helpful in terms of the accountability.  Continue current medications as prescribed.  Foot exam and urine microalbumin today.        Relevant Orders   POC Hbg A1C (Completed)   Microalbumin / Creatinine Urine Ratio (Completed)     Musculoskeletal and Integument   Tinea corporis    Presents with 2-3 month history of rash under breasts, panus and inguinal skin folds. She has tried various topical remedies including steroid and anti-fungal powder, but nothing has completely cleared it.  Rash consistent with tinea corporis. Since it is in multiple places will treat with oral diflucan q weekly x4 weeks.       Relevant Medications   fluconazole (DIFLUCAN) 150 MG tablet      Meds ordered this encounter  Medications  . fluconazole (DIFLUCAN) 150 MG tablet    Sig: Take 1 tablet (150 mg  total) by mouth once a week for 4 doses.    Dispense:  4 tablet    Refill:  1    Follow-up: Return in about 3 months (around 06/26/2020) for DM.    Bridget Hartshorn, DO

## 2020-03-29 ENCOUNTER — Encounter: Payer: No Typology Code available for payment source | Admitting: Internal Medicine

## 2020-03-29 ENCOUNTER — Encounter: Payer: Self-pay | Admitting: Internal Medicine

## 2020-03-29 DIAGNOSIS — B354 Tinea corporis: Secondary | ICD-10-CM | POA: Insufficient documentation

## 2020-03-29 LAB — MICROALBUMIN / CREATININE URINE RATIO
Creatinine, Urine: 100.2 mg/dL
Microalb/Creat Ratio: 14 mg/g creat (ref 0–29)
Microalbumin, Urine: 13.9 ug/mL

## 2020-03-29 NOTE — Assessment & Plan Note (Signed)
Presents with 2-3 month history of rash under breasts, panus and inguinal skin folds. She has tried various topical remedies including steroid and anti-fungal powder, but nothing has completely cleared it.  Rash consistent with tinea corporis. Since it is in multiple places will treat with oral diflucan q weekly x4 weeks.

## 2020-03-29 NOTE — Assessment & Plan Note (Signed)
A1C has improved to 7.9 today, down from 8.7 at last visit. She attributes some of her elevated readings to the stress surrounding some dental work she's having done. Reviewing her meter, she has been in target range a little over 80% of the time. She also plans to get back on track with her eating after the holidays. She finds weight watchers to be helpful in terms of the accountability.  Continue current medications as prescribed.  Foot exam and urine microalbumin today.

## 2020-03-29 NOTE — Assessment & Plan Note (Signed)
This problem is chronic and stable. No adverse side effects from medications.  Continue current doses of Metoprolol and Benicar. BMP checked at previous visit showed stable renal function and electrolytes.

## 2020-04-01 NOTE — Progress Notes (Signed)
Internal Medicine Clinic Attending  Case discussed with Dr. Bloomfield  At the time of the visit.  We reviewed the resident's history and exam and pertinent patient test results.  I agree with the assessment, diagnosis, and plan of care documented in the resident's note.  

## 2020-04-02 ENCOUNTER — Other Ambulatory Visit: Payer: Self-pay | Admitting: Internal Medicine

## 2020-04-02 MED FILL — ATORVASTATIN CALCIUM 20 MG: 20 | 90 days supply | Qty: 90 | Fill #0

## 2020-04-02 MED FILL — OMEPRAZOLE 20 MG CAP: 20 | 90 days supply | Qty: 90 | Fill #0

## 2020-04-02 MED FILL — OZEMPIC (1 MG/DOSE) 4 MG/3M: 4 | 28 days supply | Qty: 3 | Fill #5

## 2020-04-05 ENCOUNTER — Encounter: Payer: Self-pay | Admitting: Internal Medicine

## 2020-04-05 ENCOUNTER — Other Ambulatory Visit (HOSPITAL_COMMUNITY): Payer: Self-pay | Admitting: Internal Medicine

## 2020-04-05 MED ORDER — GUAIFENESIN-CODEINE 100-10 MG/5ML PO SOLN: 5.0000 mL | Freq: Three times a day (TID) | ORAL | 0 refills | Status: DC | PRN

## 2020-04-05 MED FILL — GUAIATUSSIN AC LIQUID: 100-10 | 12 days supply | Qty: 180 | Fill #0

## 2020-04-09 ENCOUNTER — Encounter: Payer: Self-pay | Admitting: Internal Medicine

## 2020-04-10 MED FILL — BASAGLAR 100 UNIT/ML KWIKPE: 100 | 40 days supply | Qty: 30 | Fill #2

## 2020-04-11 ENCOUNTER — Ambulatory Visit: Payer: No Typology Code available for payment source

## 2020-04-15 ENCOUNTER — Other Ambulatory Visit: Payer: Self-pay

## 2020-04-15 ENCOUNTER — Ambulatory Visit: Payer: No Typology Code available for payment source | Admitting: Family Medicine

## 2020-04-15 VITALS — BP 108/68 | Ht 65.0 in | Wt 237.0 lb

## 2020-04-15 DIAGNOSIS — M7542 Impingement syndrome of left shoulder: Secondary | ICD-10-CM | POA: Diagnosis not present

## 2020-04-15 NOTE — Progress Notes (Signed)
    SUBJECTIVE:   CHIEF COMPLAINT / HPI: F/u shoulder   Kelly Gallagher is a 50 year old female presenting for follow-up of left shoulder pain.  She has a known history of left subacromial bursitis/impingement syndrome and early adhesive capsulitis after previously been seen in the sports medicine clinic in 2021.  Last seen in 08/2019 and then continued with formal physical therapy.  Today, she reports that her shoulder was doing significantly better until last week when fell backwards onto her left shoulder after slipping on ice while walking her dog.  No loss of consciousness.  She has been using ice/heat, Voltaren gel, and Tylenol/ibuprofen to improve her pain.  Fortunately her pain has improved since onset, no significant pain elsewhere.  She still has approximately 5 PT sessions left, did not go last week due to acute fall.  Denies any significant numbness/tingling or weakness.  She has had success with nitro patches in the past.  Never tried subacromial injection due to her DM.  PERTINENT  PMH / PSH: Hypertension, T2DM, elevated BMI, IUD in place  OBJECTIVE:   BP 108/68   Ht 5\' 5"  (1.651 m)   Wt 237 lb (107.5 kg)   BMI 39.44 kg/m   General: Alert, NAD HEENT: NCAT, MMM Lungs: No increased WOB   Left Shoulder: Inspection reveals no obvious deformity, atrophy, or asymmetry. No bruising. No swelling. Mild TTP over AC joint, no TTP over bicipital groove. Limited ROM in flexion and internal rotation, abduction to approximately 140 degrees without painful arc or drop sign.  NV intact distally Special Tests:  - Impingement: Positive Hawkins, neers, empty can sign. - Supraspinatus:  5/5 strength with resisted flexion at 20 degrees - Infraspinatus/Teres Minor: 5/5 strength with ER - Subscapularis: 5/5 strength with IR - Labrum: Negative Obriens, good stability  ASSESSMENT/PLAN:   Impingement/bursitis of left shoulder: Acute on chronic. Fortunately her clinical presentation continues to appear  consistent with subacromial bursitis/impingement after her recent fall, however has caused some regression in her recent progress.  Reassuringly no s/sx of RTC tear or shoulder instability.  Discussed continued Tylenol/ibuprofen with ice/heat PRN.  May restart nitro patches q 24 hours as these have provided relief for her previously, already has several leftover at home.  Continue PT.  Follow-up if not improving as expected in the next 4 to 6 weeks or as needed if doing well.  , DO [facility]

## 2020-04-16 ENCOUNTER — Encounter: Payer: Self-pay | Admitting: Family Medicine

## 2020-04-17 ENCOUNTER — Ambulatory Visit: Payer: No Typology Code available for payment source | Attending: Family Medicine

## 2020-04-17 ENCOUNTER — Other Ambulatory Visit: Payer: Self-pay

## 2020-04-17 DIAGNOSIS — G8929 Other chronic pain: Secondary | ICD-10-CM | POA: Insufficient documentation

## 2020-04-17 DIAGNOSIS — M6281 Muscle weakness (generalized): Secondary | ICD-10-CM | POA: Diagnosis present

## 2020-04-17 DIAGNOSIS — M25612 Stiffness of left shoulder, not elsewhere classified: Secondary | ICD-10-CM | POA: Diagnosis present

## 2020-04-17 DIAGNOSIS — M25512 Pain in left shoulder: Secondary | ICD-10-CM | POA: Diagnosis present

## 2020-04-17 NOTE — Therapy (Addendum)
[facility] Outpatient Rehabilitation [facility] [address] [place], [place], [address] Phone: [phone]   Fax:  [phone]  Physical Therapy Treatment  Patient Details  Name: Kelly Gallagher MRN: [id number] Date of Birth: 1970/05/05 Referring Provider (PT): Lenda Kelp, MD   Encounter Date: 04/17/2020   PT End of Session - 04/17/20 1906    Visit Number 7    Number of Visits 13    Date for PT Re-Evaluation 05/04/20    Authorization Type [employer]    PT Start Time 1755    PT Stop Time 1850    PT Time Calculation (min) 55 min    Activity Tolerance Patient tolerated treatment well    Behavior During Therapy [facility] for tasks assessed/performed           Past Medical History:  Diagnosis Date  . Diabetes mellitus without complication (HCC)   . Hypertension     Past Surgical History:  Procedure Laterality Date  . CHOLECYSTECTOMY    . DILATION AND CURETTAGE OF UTERUS      There were no vitals filed for this visit.   Subjective Assessment - 04/17/20 1756    Subjective Patient had a fall about a week ago that was so bad she couldn't get up on her own. She fell flat on her back and took the brunt of the fall on the Lt side. She has pain lingering along Lt midback and Lt shoulder. She had f/u with orthopedic specialist for the Lt shoulder and was recommended to continue voltaren gel, move to tolerance, and use nitro patch. Patient is taking OTC pain meds for back pain and heat pad as needed.    Currently in Pain? Yes    Pain Score 4     Pain Location Shoulder    Pain Orientation Left    Pain Descriptors / Indicators Aching    Pain Type Chronic pain    Pain Onset More than a month ago    Multiple Pain Sites Yes    Pain Score 6    Pain Location Thoracic    Pain Orientation Left    Pain Descriptors / Indicators Constant;Aching    Pain Onset In the past 7 days    Pain Frequency Constant              [facility] PT Assessment - 04/17/20 0001       AROM   Left Shoulder Flexion 125 Degrees    Left Shoulder ABduction 130 Degrees      Strength   Overall Strength Comments gross 5/5 with pain resisted scaption and flexion      Hawkins-Kennedy test   Findings Positive    Side Left      Empty Can test   Findings Negative                         [facility] Adult PT Treatment/Exercise - 04/17/20 0001      Self-Care   Self-Care Other Self-Care Comments    Other Self-Care Comments  see patient education      Shoulder Exercises: Supine   Other Supine Exercises supine LTR 1 x 10      Shoulder Exercises: Seated   Retraction Limitations scapular retraction 2x 10, cervical retraction 1 x 10    Other Seated Exercises stability ball rollout 2 min      Modalities   Modalities Cryotherapy;Moist Heat      Moist Heat Therapy  Number Minutes Moist Heat 10 Minutes    Moist Heat Location Lumbar Spine      Cryotherapy   Number Minutes Cryotherapy 10 Minutes    Cryotherapy Location Shoulder    Type of Cryotherapy Ice pack      Manual Therapy   Manual therapy comments skilled palpation and monitoring during TPDN    Joint Mobilization STM deltoid, posterior rotator cuff    Soft tissue mobilization Lt deltoid            Trigger Point Dry Needling - 04/17/20 0001    Consent Given? Yes    Muscles Treated Upper Quadrant Deltoid    Deltoid Response Twitch response elicited;Palpable increased muscle length   Lt               PT Education - 04/17/20 1900    Education Details Recommendation to f/u with physician regarding ongoing midback pain from her fall. HEP updated. Postural education. Modalities for pain control, issued biofreeze    Person(s) Educated Patient    Methods Explanation;Demonstration;Verbal cues;Tactile cues;Handout    Comprehension Verbalized understanding;Returned demonstration;Need further instruction;Tactile cues required;Verbal cues required            PT Short Term Goals - 01/16/20 1726       PT SHORT TERM GOAL #1   Title Pt will be Ind c an initial HEP    Status Achieved      PT SHORT TERM GOAL #2   Title Review the results of FOTO survey    Status Achieved      PT SHORT TERM GOAL #3   Title Pt will voice understanding of measures for pain reduction and management    Status Achieved             PT Long Term Goals - 03/19/20 1811      PT LONG TERM GOAL #1   Title Improve L sh flexion AROM to 130d of greater for improved functional use of the L UE    Status Achieved      PT LONG TERM GOAL #2   Title pt will report improved L shoulder pain range with daily activities to 3/10 or less.    Baseline 2/10    Status Achieved      PT LONG TERM GOAL #3   Title Pt will report improved ability to lift a 1lb can of soup to shoulder height as not being difficulty    Status Achieved      PT LONG TERM GOAL #4   Title Pt's FOTO score will improved to 36% limitation    Status Achieved      PT LONG TERM GOAL #5   Title Pt will be Ind in a final HEP to improve the functional use of her L UE.    Baseline not as frequent as I like but I try to correct my posture    Status On-going      Additional Long Term Goals   Additional Long Term Goals Yes      PT LONG TERM GOAL #6   Title will be able to be independent in reaching items on high shelf in closet & get stoneware down from above the fridge    Baseline asks husband    Time 6    Period Weeks    Status New    Target Date 05/04/20      PT LONG TERM GOAL #7   Title pt will find that she uses her Lt UE  at least 50% of the time in functional activities    Baseline maybe 15% due to compensations using Rt UE.    Time 6    Period Weeks    Status New    Target Date 05/04/20                 Plan - 04/17/20 1829    Clinical Impression Statement Patient has minor regression in Lt shoulder progress since last session secondary to sustaining a fall last week that has caused increased shoulder pain and decreased ROM.  Since her fall she had f/u with Dr. Pearletha Forge who recommended continuing with PT for her bursitis/impingement and to f/u in 4-6 weeks if needed. Upon examination tonight her signs and symptoms remain consistent with impingement syndrome with no findings suggestive of rotator cuff pathology at this time. She tolerated session well today with gentle ROM activity and periscapular strengthening without increased reports of shoulder pain requiring postural cues to decrease forward head and excessive upper trap engagement. Patient is also complaining of ongoing midback pain since her fall and was recommended to f/u with physician for potential referral for ongoing pain.    PT Treatment/Interventions ADLs/Self Care Home Management;Cryotherapy;Electrical Stimulation;Ultrasound;Moist Heat;Iontophoresis 4mg /ml Dexamethasone;Therapeutic activities;Therapeutic exercise;Manual techniques;Patient/family education;Passive range of motion;Dry needling;Taping    PT Next Visit Plan update HEP. progress ROM as tolerated.    PT Home Exercise Plan [id number].    Consulted and Agree with Plan of Care Patient           Patient will benefit from skilled therapeutic intervention in order to improve the following deficits and impairments:  Decreased strength,Postural dysfunction,Pain,Obesity,Decreased range of motion  Visit Diagnosis: Chronic left shoulder pain  Decreased ROM of left shoulder  Muscle weakness (generalized)     Problem List Patient Active Problem List   Diagnosis Date Noted  . Tinea corporis 03/29/2020  . Tendinopathy of left rotator cuff 09/18/2019  . Impingement syndrome of left shoulder 06/26/2019  . Vaginal candidiasis 04/25/2019  . Musculoskeletal pain 04/29/2018  . Healthcare maintenance 04/29/2018  . DM (diabetes mellitus) type II controlled, 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HCC) 04/28/2018  . Hypertension 04/28/2018  . Generalized anxiety disorder 04/28/2018  . Hyperlipidemia 04/28/2018  .  Morbid obesity (HCC) 04/28/2018  . IUD (intrauterine device) in place 04/28/2018  . GERD (gastroesophageal reflux disease) 04/28/2018  . Reactive airway disease 04/28/2018  . Vitamin B12 deficiency 04/28/2018   06/27/2018, PT, DPT, ATC 04/17/20 7:14 PM   04/19/20, PT, DPT, ATC 04/18/20 2:56 PM  [facility] [address] [place], [place], [place] Phone: [phone]   Fax:  [phone]  Name: CONSUELA WIDENER MRN: [name] Date of Birth: Jun 10, 1970

## 2020-04-18 MED FILL — OLMESARTAN-HCTZ 20-12.5 MG: 20-12.5 | 90 days supply | Qty: 90 | Fill #1

## 2020-04-19 ENCOUNTER — Other Ambulatory Visit: Payer: Self-pay

## 2020-04-19 DIAGNOSIS — M7542 Impingement syndrome of left shoulder: Secondary | ICD-10-CM

## 2020-04-19 DIAGNOSIS — M545 Low back pain, unspecified: Secondary | ICD-10-CM

## 2020-04-20 MED FILL — DULOXETINE HCL 60 MG CPEP: 60 | 30 days supply | Qty: 30 | Fill #2

## 2020-04-20 MED FILL — FLUCONAZOLE 150 MG TABS: 150 | 28 days supply | Qty: 4 | Fill #1

## 2020-04-24 ENCOUNTER — Ambulatory Visit: Payer: No Typology Code available for payment source | Attending: Family Medicine

## 2020-04-24 ENCOUNTER — Other Ambulatory Visit: Payer: Self-pay

## 2020-04-24 ENCOUNTER — Other Ambulatory Visit: Payer: Self-pay | Admitting: Student

## 2020-04-24 DIAGNOSIS — M25612 Stiffness of left shoulder, not elsewhere classified: Secondary | ICD-10-CM | POA: Insufficient documentation

## 2020-04-24 DIAGNOSIS — M25512 Pain in left shoulder: Secondary | ICD-10-CM | POA: Insufficient documentation

## 2020-04-24 DIAGNOSIS — M546 Pain in thoracic spine: Secondary | ICD-10-CM | POA: Diagnosis not present

## 2020-04-24 DIAGNOSIS — M6281 Muscle weakness (generalized): Secondary | ICD-10-CM | POA: Insufficient documentation

## 2020-04-24 DIAGNOSIS — G8929 Other chronic pain: Secondary | ICD-10-CM | POA: Diagnosis present

## 2020-04-24 DIAGNOSIS — M5442 Lumbago with sciatica, left side: Secondary | ICD-10-CM | POA: Insufficient documentation

## 2020-04-24 NOTE — Therapy (Addendum)
[place], [place], [address] Phone: [phone]   Fax:  [phone]  Physical Therapy Treatment/Re-evaluation/Discharge  Patient Details  Name: Kelly Gallagher MRN: [id number] Date of Birth: October 11, 1970 Referring Provider (PT): Dene Gentry, MD   Encounter Date: 04/24/2020   PT End of Session - 04/24/20 2101    Visit Number 8    Number of Visits 20    Date for PT Re-Evaluation 06/08/20    Authorization Type [place] FOCUS    PT Start Time 3790    PT Stop Time 1800    PT Time Calculation (min) 45 min    Activity Tolerance Patient tolerated treatment well    Behavior During Therapy [facility] for tasks assessed/performed           Past Medical History:  Diagnosis Date  . Diabetes mellitus without complication ([place])   . Hypertension     Past Surgical History:  Procedure Laterality Date  . CHOLECYSTECTOMY    . DILATION AND CURETTAGE OF UTERUS      There were no vitals filed for this visit.   Subjective Assessment - 04/24/20 1719    Subjective Patient reports ongoing back pain from a fall on 04/09/20 and reached out to her physician who has sent a referral to evaluate and treat for low back pain in addition to continuing care for her Lt shoulder impingement. Patient was walking her dog in the ice and feet went out from under her and she landed on her back. She does not recall hitting her head and no LOC. Patient reports the back pain has improved some since her fall, but it still having Lt sided pain along her ribcage and low back. Patient has been completing some stretches and is taking ibuprofen. Patient reports the pain is worse in the AM and is a little better as the day goes on. She reports numbness in the LLE along posterior aspect to the knee. She denies any changes in bowel/bladder or saddle parasthesia. Patient reports having a little setback in her shoulder progress secondary to her fall, but is  definitely better compared to last week.    Limitations Lifting;House hold activities    How long can you sit comfortably? probably half hour    How long can you stand comfortably? as long as I'm not carrying/lifting about half hour    How long can you walk comfortably? probably about half hour    Diagnostic tests IMPRESSION:Intact and normal appearing rotator cuff and long head of biceps. Mild to moderate acromioclavicular osteoarthritis. Mild subacromialspurring also noted. Small volume of subacromial/subdeltoid suggestive of bursitis.    Patient Stated Goals My goal is I want to continue to work on the reaching with the Lt shoulder building that kind of strength and I would like to be able to have  reduction in pain on the sides.    Currently in Pain? Yes    Pain Score 5     Pain Location Rib cage    Pain Orientation Left    Pain Descriptors / Indicators Aching    Pain Type Acute pain    Pain Onset 1 to 4 weeks ago    Pain Frequency Intermittent    Aggravating Factors  sneezing, coughing    Pain Relieving Factors stretches, ibuprofen, hot shower, ice    Effect of Pain on Daily Activities moderate    Multiple Pain Sites Yes    Pain Score 2    Pain  Location Shoulder    Pain Orientation Left    Pain Descriptors / Indicators Tightness    Pain Type Chronic pain    Pain Onset More than a month ago    Pain Frequency Constant    Aggravating Factors  reaching, carrying    Pain Relieving Factors heat    Effect of Pain on Daily Activities difficulty with reaching and lifting activities.              [facility] PT Assessment - 04/24/20 0001      Assessment   Medical Diagnosis M75.42 (ICD-10-CM) - Impingement syndrome of left shoulder  M54.50 (ICD-10-CM) - Acute low back pain without sciatica, unspecified back pain laterality    Referring Provider (PT) Dene Gentry, MD    Onset Date/Surgical Date 04/09/20    Hand Dominance Right      Posture/Postural Control   Posture Comments  forward head and rounded shoulders      AROM   Left Shoulder Flexion 155 Degrees    Left Shoulder ABduction 135 Degrees    Lumbar Flexion 25% ;limitation pain    Lumbar Extension WNL    Lumbar - Right Side Bend 50% limitation pain    Lumbar - Left Side Bend 50% limitation pain    Lumbar - Right Rotation 50% limitation pain    Lumbar - Left Rotation WNL      Strength   Overall Strength Comments gross 5/5 with pain resisted scaption and flexion and ER LUE    Right Hip Flexion 5/5    Left Hip Flexion 4/5   pain     Palpation   Spinal mobility Gross hypomobility about thoracic and lumbar spine; pain with rib springing T5-T8.    Palpation comment Tautness and palpable tenderness Lt thoracic paraspinals.      Special Tests   Other special tests (+) SLR LLE                         [facility] Adult PT Treatment/Exercise - 04/24/20 0001      Self-Care   Other Self-Care Comments  see patient education      Lumbar Exercises: Stretches   Other Lumbar Stretch Exercise sidelying open book 1 x 5      Lumbar Exercises: Standing   Other Standing Lumbar Exercises lumbar extension 1 x 10      Manual Therapy   Soft tissue mobilization STM/DTM thoracic and lumbar paraspinals Lt                  PT Education - 04/24/20 2101    Education Details Education on current condition, POC, HEP, modalities for pain control.    Person(s) Educated Patient    Methods Explanation;Demonstration;Handout    Comprehension Verbalized understanding;Returned demonstration            PT Short Term Goals - 01/16/20 1726      PT SHORT TERM GOAL #1   Title Pt will be Ind c an initial HEP    Status Achieved      PT SHORT TERM GOAL #2   Title Review the results of FOTO survey    Status Achieved      PT SHORT TERM GOAL #3   Title Pt will voice understanding of measures for pain reduction and management    Status Achieved             PT Long Term Goals - 04/24/20 2103  PT  LONG TERM GOAL #1   Title Improve L sh flexion AROM to 130d of greater for improved functional use of the L UE    Status Achieved      PT LONG TERM GOAL #2   Title pt will report improved L shoulder pain range with daily activities to 3/10 or less.    Baseline 3/10    Status Achieved      PT LONG TERM GOAL #3   Title Pt will report improved ability to lift a 1lb can of soup to shoulder height as not being difficulty    Status Achieved      PT LONG TERM GOAL #4   Title Pt's FOTO score will improved to 36% limitation    Status Achieved      PT LONG TERM GOAL #5   Title Pt will be Ind in a final HEP to improve the functional use of her L UE.    Baseline not as frequent as I like but I try to correct my posture    Status On-going      PT LONG TERM GOAL #6   Title will be able to be independent in reaching items on high shelf in closet & get stoneware down from above the fridge    Baseline asks husband    Time 6    Period Weeks    Status On-going    Target Date 06/05/20      PT LONG TERM GOAL #7   Title pt will find that she uses her Lt UE at least 50% of the time in functional activities    Baseline maybe 15% due to compensations using Rt UE.    Time 6    Period Weeks    Status On-going    Target Date 06/05/20      PT LONG TERM GOAL #8   Title Patient will demonstrate full and pain free lumbar AROM    Baseline see flowsheet    Time 6    Period Weeks    Status New    Target Date 06/05/20      PT LONG TERM GOAL  #9   TITLE Patient will tolerate lying on her Lt side    Baseline unable    Time 6    Period Weeks    Status New    Target Date 06/05/20      PT LONG TERM GOAL  #10   TITLE Patient will demonstrate full and pain free Lt hip flexion strength to improve overall stability with prolonged walking/standing activity.    Baseline see flowsheet    Time 6    Period Weeks    Status New    Target Date 06/05/20                 Plan - 04/24/20 1748     Clinical Impression Statement Patient has new referral for acute low back pain that began after she fell on her back while walking her dog on 04/09/20. She reports the worst area of pain along Lt posterior ribcage and mild low back pain with occasional numbness along posterior LLE to the knee. Her current signs and symptoms appear consistent with midback contusion as well as potential disc pathology as patient has (+) SLR and reports centralization of her LLE numbness with repeated lumbar extension. No red flag symptoms noted during examination.She has limited and painful trunk AROM, thoracic and lumbar spine hypomobility, hip weakness, and moderate ongoing pain due to  this recent fall. She will benefit from continued PT to address these newfound impairments as well as continuing to address her impairments as they relate to Lt shoulder impingement.    Personal Factors and Comorbidities Comorbidity 2    Comorbidities diabetes, obesity    Examination-Activity Limitations Reach Overhead;Carry;Lift;Bend;Bed Mobility;Sleep;Stand    Examination-Participation Restrictions Laundry;Shop;Cleaning    Stability/Clinical Decision Making Stable/Uncomplicated    Clinical Decision Making Low    Rehab Potential Good    PT Frequency --   1-2/week   PT Duration 6 weeks    PT Treatment/Interventions ADLs/Self Care Home Management;Cryotherapy;Electrical Stimulation;Ultrasound;Moist Heat;Iontophoresis 4mg /ml Dexamethasone;Therapeutic activities;Therapeutic exercise;Manual techniques;Patient/family education;Passive range of motion;Dry needling;Taping;Neuromuscular re-education    PT Next Visit Plan review HEP. trunk mobility, periscapular strengthening. core stability    PT Home Exercise Plan [id number].    Consulted and Agree with Plan of Care Patient           Patient will benefit from skilled therapeutic intervention in order to improve the following deficits and impairments:  Decreased strength,Postural  dysfunction,Pain,Obesity,Decreased range of motion,Hypomobility  Visit Diagnosis: Pain in thoracic spine  Chronic left shoulder pain  Decreased ROM of left shoulder  Muscle weakness (generalized)  Acute low back pain with left-sided sciatica, unspecified back pain laterality     Problem List Patient Active Problem List   Diagnosis Date Noted  . Tinea corporis 03/29/2020  . Tendinopathy of left rotator cuff 09/18/2019  . Impingement syndrome of left shoulder 06/26/2019  . Vaginal candidiasis 04/25/2019  . Musculoskeletal pain 04/29/2018  . Healthcare maintenance 04/29/2018  . DM (diabetes mellitus) type II controlled, 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[place]) 04/28/2018  . Hypertension 04/28/2018  . Generalized anxiety disorder 04/28/2018  . Hyperlipidemia 04/28/2018  . Morbid obesity ([place]) 04/28/2018  . IUD (intrauterine device) in place 04/28/2018  . GERD (gastroesophageal reflux disease) 04/28/2018  . Reactive airway disease 04/28/2018  . Vitamin B12 deficiency 04/28/2018   Gwendolyn Grant, PT, DPT, ATC 04/24/20 9:21 PM  [facility] [address] [place], [place], [address] Phone: [phone]   Fax:  [phone]  Name: Kelly Gallagher MRN: [id number] Date of Birth: 09-Feb-1971   PHYSICAL THERAPY DISCHARGE SUMMARY  Visits from Start of Care: 8  Current functional level related to goals / functional outcomes: See above   Remaining deficits: See above   Education / Equipment: HEP Plan: Patient agrees to discharge.  Patient goals were partially met. Patient is being discharged due to not returning since the last visit.  ?????       Allen Ralls MS, PT 06/26/20 12:49 PM

## 2020-04-25 ENCOUNTER — Other Ambulatory Visit: Payer: Self-pay | Admitting: Student

## 2020-04-25 MED FILL — FREESTYLE LIBRE 2 SENSOR MI: 28 days supply | Qty: 2 | Fill #0

## 2020-04-26 ENCOUNTER — Encounter: Payer: No Typology Code available for payment source | Admitting: Internal Medicine

## 2020-04-30 MED FILL — OZEMPIC (1 MG/DOSE) 4 MG/3M: 4 | 28 days supply | Qty: 3 | Fill #6

## 2020-05-01 ENCOUNTER — Emergency Department (HOSPITAL_BASED_OUTPATIENT_CLINIC_OR_DEPARTMENT_OTHER)
Admission: EM | Admit: 2020-05-01 | Discharge: 2020-05-01 | Disposition: A | Discharge status: Home / Self Care | Payer: No Typology Code available for payment source | Attending: Emergency Medicine | Admitting: Emergency Medicine

## 2020-05-01 ENCOUNTER — Other Ambulatory Visit: Payer: Self-pay | Admitting: *Deleted

## 2020-05-01 ENCOUNTER — Ambulatory Visit: Payer: No Typology Code available for payment source

## 2020-05-01 ENCOUNTER — Encounter (HOSPITAL_BASED_OUTPATIENT_CLINIC_OR_DEPARTMENT_OTHER): Payer: Self-pay

## 2020-05-01 ENCOUNTER — Other Ambulatory Visit: Payer: Self-pay

## 2020-05-01 DIAGNOSIS — R519 Headache, unspecified: Secondary | ICD-10-CM | POA: Insufficient documentation

## 2020-05-01 DIAGNOSIS — R197 Diarrhea, unspecified: Secondary | ICD-10-CM | POA: Diagnosis not present

## 2020-05-01 DIAGNOSIS — R000 Tachycardia, unspecified: Secondary | ICD-10-CM | POA: Diagnosis not present

## 2020-05-01 DIAGNOSIS — Z794 Long term (current) use of insulin: Secondary | ICD-10-CM | POA: Insufficient documentation

## 2020-05-01 DIAGNOSIS — Z79899 Other long term (current) drug therapy: Secondary | ICD-10-CM | POA: Diagnosis not present

## 2020-05-01 DIAGNOSIS — I10 Essential (primary) hypertension: Secondary | ICD-10-CM | POA: Diagnosis not present

## 2020-05-01 DIAGNOSIS — E1149 Type 2 diabetes mellitus with other diabetic neurological complication: Secondary | ICD-10-CM | POA: Diagnosis not present

## 2020-05-01 DIAGNOSIS — R112 Nausea with vomiting, unspecified: Secondary | ICD-10-CM | POA: Insufficient documentation

## 2020-05-01 LAB — CBC
HCT: 47.4 % — ABNORMAL HIGH (ref 36.0–46.0)
Hemoglobin: 15.8 g/dL — ABNORMAL HIGH (ref 12.0–15.0)
MCH: 28.3 pg (ref 26.0–34.0)
MCHC: 33.3 g/dL (ref 30.0–36.0)
MCV: 84.9 fL (ref 80.0–100.0)
Platelets: 267 10*3/uL (ref 150–400)
RBC: 5.58 MIL/uL — ABNORMAL HIGH (ref 3.87–5.11)
RDW: 13.2 % (ref 11.5–15.5)
WBC: 16.7 10*3/uL — ABNORMAL HIGH (ref 4.0–10.5)
nRBC: 0 % (ref 0.0–0.2)

## 2020-05-01 LAB — COMPREHENSIVE METABOLIC PANEL
ALT: 25 U/L (ref 0–44)
AST: 22 U/L (ref 15–41)
Albumin: 4.1 g/dL (ref 3.5–5.0)
Alkaline Phosphatase: 57 U/L (ref 38–126)
Anion gap: 14 (ref 5–15)
BUN: 15 mg/dL (ref 6–20)
CO2: 24 mmol/L (ref 22–32)
Calcium: 8.4 mg/dL — ABNORMAL LOW (ref 8.9–10.3)
Chloride: 95 mmol/L — ABNORMAL LOW (ref 98–111)
Creatinine, Ser: 0.6 mg/dL (ref 0.44–1.00)
GFR, Estimated: >60 mL/min (ref >60)
Glucose, Bld: 244 mg/dL — ABNORMAL HIGH (ref 70–99)
Potassium: 3.4 mmol/L — ABNORMAL LOW (ref 3.5–5.1)
Sodium: 133 mmol/L — ABNORMAL LOW (ref 135–145)
Total Bilirubin: 1 mg/dL (ref 0.3–1.2)
Total Protein: 7.1 g/dL (ref 6.5–8.1)

## 2020-05-01 LAB — URINALYSIS, ROUTINE W REFLEX MICROSCOPIC
Bilirubin Urine: NEGATIVE
Glucose, UA: >=500 mg/dL — AB
Ketones, ur: 80 mg/dL — AB
Leukocytes,Ua: NEGATIVE
Nitrite: NEGATIVE
Protein, ur: NEGATIVE mg/dL
Specific Gravity, Urine: 1.02 (ref 1.005–1.030)
pH: 6 (ref 5.0–8.0)

## 2020-05-01 LAB — URINALYSIS, MICROSCOPIC (REFLEX)

## 2020-05-01 LAB — LIPASE, BLOOD: Lipase: 23 U/L (ref 11–51)

## 2020-05-01 LAB — PREGNANCY, URINE: Preg Test, Ur: NEGATIVE

## 2020-05-01 MED ORDER — DIPHENHYDRAMINE HCL 50 MG/ML IJ SOLN
25.0000 mg | Freq: Once | INTRAMUSCULAR | Status: AC
  Administered 2020-05-01: 25 mg via INTRAVENOUS
  Filled 2020-05-01: qty 1

## 2020-05-01 MED ORDER — KETOROLAC TROMETHAMINE 15 MG/ML IJ SOLN
15.0000 mg | Freq: Once | INTRAMUSCULAR | Status: AC
  Administered 2020-05-01: 15 mg via INTRAVENOUS
  Filled 2020-05-01: qty 1

## 2020-05-01 MED ORDER — LACTATED RINGERS IV BOLUS
1000.0000 mL | Freq: Once | INTRAVENOUS | Status: AC
  Administered 2020-05-01: 1000 mL via INTRAVENOUS

## 2020-05-01 MED ORDER — METOCLOPRAMIDE HCL 5 MG/ML IJ SOLN
10.0000 mg | INTRAMUSCULAR | Status: AC
  Administered 2020-05-01: 10 mg via INTRAVENOUS
  Filled 2020-05-01: qty 2

## 2020-05-01 MED ORDER — LACTATED RINGERS IV BOLUS: 1000.0000 mL | Freq: Once | INTRAVENOUS | Status: DC

## 2020-05-01 MED ORDER — METOCLOPRAMIDE HCL 10 MG PO TABS: 10.0000 mg | ORAL_TABLET | Freq: Four times a day (QID) | ORAL | 0 refills | Status: DC | PRN

## 2020-05-01 MED ORDER — ONDANSETRON HCL 4 MG/2ML IJ SOLN
4.0000 mg | Freq: Once | INTRAMUSCULAR | Status: AC
  Administered 2020-05-01: 4 mg via INTRAVENOUS
  Filled 2020-05-01: qty 2

## 2020-05-01 NOTE — Patient Outreach (Signed)
[facility]  05/01/2020  Kelly Gallagher Nov 20, 1970 [id number]   Telephone Screening   Referral received: 05/01/20 Referral source: Nurse call Line Reason : Patient with complaint of Nausea vomiting, dizziness, rapid heart rate in 120/s.   Subjective; Outreach call to Kelly Gallagher, able to leave a HIPAA compliant voicemail message for return call. Noted patient now in emergency room, she did return call explained reason for outreach.   Plan Will plan follow up call pending disposition/plans.    Egbert Garibaldi, RN, BSN  [facility] Care Management,Care Management Coordinator  [phone]- Mobile [phone]- Toll Free Main Office

## 2020-05-01 NOTE — Discharge Instructions (Signed)
You likely have a stomach virus   Stay hydrated and take Reglan as needed for nausea  See your doctor for follow-up  Your white blood cell count is slightly elevated and recommend repeat in a week.  Return to ER if you have worse abdominal pain, fever, vomiting, dehydration

## 2020-05-01 NOTE — ED Provider Notes (Signed)
[facility] EMERGENCY DEPARTMENT Provider Note   CSN: [id number] Arrival date & time: 05/01/20  1216     History Chief Complaint  Patient presents with  . Diarrhea    Kelly Gallagher is a 50 y.o. female.  50yo F w/ IDDM, HTN, anxiety, obesity who p/w N/V/D. She was feeling queasy when she went to bed last night. She woke up at 1:45am with sudden onset of vomiting and diarrhea; had 4-5 episodes throughout the night. She has since developed a headache and has noticed fast heart rate and elevated BG at home which worried her. She was unable to take her medications this morning. No abdominal pain currently. No sick contacts or recent travel. No urinary symptoms or fevers. No URI symptoms.   The history is provided by the patient.  Diarrhea      Past Medical History:  Diagnosis Date  . Diabetes mellitus without complication ([facility])   . Hypertension     Patient Active Problem List   Diagnosis Date Noted  . Tinea corporis 03/29/2020  . Tendinopathy of left rotator cuff 09/18/2019  . Impingement syndrome of left shoulder 06/26/2019  . Vaginal candidiasis 04/25/2019  . Musculoskeletal pain 04/29/2018  . Healthcare maintenance 04/29/2018  . DM (diabetes mellitus) type II controlled, 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H[facility]) 04/28/2018  . Hypertension 04/28/2018  . Generalized anxiety disorder 04/28/2018  . Hyperlipidemia 04/28/2018  . Morbid obesity ([facility]) 04/28/2018  . IUD (intrauterine device) in place 04/28/2018  . GERD (gastroesophageal reflux disease) 04/28/2018  . Reactive airway disease 04/28/2018  . Vitamin B12 deficiency 04/28/2018    Past Surgical History:  Procedure Laterality Date  . CHOLECYSTECTOMY    . DILATION AND CURETTAGE OF UTERUS       OB History    Gravida  1   Para      Term      Preterm      AB  1   Living        SAB  1   IAB      Ectopic      Multiple      Live Births           Obstetric Comments  "chemical pregnancy"         Family History  Problem Relation Age of Onset  . Cancer Father        Head and neck cancer. Has PEG  . Lupus Sister   . Breast cancer Other   . Breast cancer Other     Social History   Tobacco Use  . Smoking status: Never Smoker  . Smokeless tobacco: Never Used  Vaping Use  . Vaping Use: Never used  Substance Use Topics  . Alcohol use: Not Currently  . Drug use: Never    Home Medications Prior to Admission medications   Medication Sig Start Date End Date Taking? Authorizing Provider  ALPRAZolam Prudy Feeler) 0.5 MG tablet TAKE 1 TABLET BY MOUTH ONCE DAILY AS NEEDED FOR ANXIETY 01/11/20   Quincy Simmonds, MD  atorvastatin (LIPITOR) 20 MG tablet TAKE 1 TABLET BY MOUTH ONCE A DAY 04/02/20   [place]omfield, Carley D, DO  DULoxetine (CYMBALTA) 60 MG capsule TAKE 1 CAPSULE BY MOUTH DAILY. 02/26/20   Elige Radon, MD  omeprazole (PRILOSEC) 20 MG capsule TAKE 1 CAPSULE BY MOUTH ONCE A DAY 04/02/20   [place]omfield, Carley D, DO  UNIFINE PENTIPS 32G X 4 MM MISC USE TO INJECT INSULIN INTO THE SKIN 2 TIMES DAILY 03/11/20   Claudette Laws,  Amil Amen, MD  Albuterol Sulfate 108 (90 Base) MCG/ACT AEPB Inhale 2 puffs into the lungs every 4 (four) hours as needed. 04/28/18   Burns Spain, MD  betamethasone dipropionate 0.05 % cream Apply topically 2 (two) times daily. 01/16/20   Quincy Simmonds, MD  Canagliflozin-metFORMIN HCl ER (INVOKAMET XR) [phone] MG TB24 Take 1 tablet by mouth 2 (two) times daily with a meal. 11/23/19   Alphonzo Severance, MD  Continuous Blood Gluc Receiver (FREESTYLE LIBRE 2 READER SYSTM) DEVI 1 each by Does not apply route 4 (four) times daily. 09/14/19   Reymundo Poll, MD  Continuous Blood Gluc Sensor (FREESTYLE LIBRE 2 SENSOR) MISC USE TO CHECK BLOOD SUGAR FOUR TIMES DAILY. CHANGE SENSOR EVERY 14 DAYS AS DIRECTED 04/25/20   Remo Lipps, MD  guaiFENesin-codeine 100-10 MG/5ML syrup Take 5 mLs by mouth 3 (three) times daily as needed for cough. 04/05/20   [place], Carley D, DO  Insulin  Glargine (BASAGLAR KWIKPEN) 100 UNIT/ML Inject 0.6 mLs (60 Units total) into the skin daily AND 0.4 mLs (40 Units total) at bedtime. 09/14/19   Claudean Severance, MD  lidocaine (LIDODERM) 5 % Place 1 patch onto the skin daily. Remove & Discard patch within 12 hours or as directed by MD 11/20/19   Albertha Ghee, MD  metoprolol succinate (TOPROL XL) 100 MG 24 hr tablet Take 1 tablet (100 mg total) by mouth daily. Take with or immediately following a meal. 11/30/19 11/29/20  Alphonzo Severance, MD  Multiple Vitamin (MULTIVITAMIN) tablet Take 1 tablet by mouth daily.    [provider]  nitroGLYCERIN (NITRODUR - DOSED IN MG/24 HR) 0.2 mg/hr patch Apply 1/4th patch to affected shoulder, change daily 03/28/20   Hudnall, Azucena Fallen, MD  olmesartan-hydrochlorothiazide (BENICAR HCT) 20-12.5 MG tablet Take 1 tablet by mouth daily. 01/25/20   Remo Lipps, MD  Omega-3 Fatty Acids (FISH OIL) 1000 MG CAPS Take by mouth.    [provider]  Probiotic Product (PROBIOTIC-10 PO) Take by mouth.    [provider]  Semaglutide, 1 MG/DOSE, 2 MG/1.5ML SOPN Inject 0.75 mLs (1 mg total) into the skin once a week. 11/20/19   Albertha Ghee, MD  vitamin B-12 (CYANOCOBALAMIN) 1000 MCG tablet Take by mouth.    [provider]    Allergies    Ace inhibitors and Effexor xr [venlafaxine hcl]  Review of Systems   Review of Systems  Gastrointestinal: Positive for diarrhea.  All other systems reviewed and are negative except that which was mentioned in HPI   Physical Exam Updated Vital Signs BP 117/62 (BP Location: Right Arm)   Pulse (!) 105   Temp 98.6 F (37 C) (Oral)   Resp 18   Ht 5\' 5"  (1.651 m)   Wt 108.4 kg   SpO2 97%   BMI 39.77 kg/m   Physical Exam Constitutional:      General: She is not in acute distress.    Appearance: Normal appearance.     Comments: uncomfortable  HENT:     Head: Normocephalic and atraumatic.     Mouth/Throat:     Mouth: Mucous membranes are dry.  Eyes:      Conjunctiva/sclera: Conjunctivae normal.     Pupils: Pupils are equal, round, and reactive to light.  Cardiovascular:     Rate and Rhythm: Regular rhythm. Tachycardia present.     Heart sounds: Normal heart sounds. No murmur heard.   Pulmonary:     Effort: Pulmonary effort is normal.  Breath sounds: Normal breath sounds.  Abdominal:     General: Abdomen is flat. Bowel sounds are normal. There is no distension.     Palpations: Abdomen is soft.     Tenderness: There is no abdominal tenderness.  Musculoskeletal:     Right lower leg: No edema.     Left lower leg: No edema.  Skin:    General: Skin is warm and dry.  Neurological:     Mental Status: She is alert and oriented to person, place, and time.     Comments: fluent  Psychiatric:        Mood and Affect: Mood normal.        Behavior: Behavior normal.     ED Results / Procedures / Treatments   Labs (all labs ordered are listed, but only abnormal results are displayed) Labs Reviewed  COMPREHENSIVE METABOLIC PANEL - Abnormal; Notable for the following components:      Result Value   Sodium 133 (*)    Potassium 3.4 (*)    Chloride 95 (*)    Glucose, Bld 244 (*)    Calcium 8.4 (*)    All other components within normal limits  CBC - Abnormal; Notable for the following components:   WBC 16.7 (*)    RBC 5.58 (*)    Hemoglobin 15.8 (*)    HCT 47.4 (*)    All other components within normal limits  LIPASE, BLOOD  URINALYSIS, ROUTINE W REFLEX MICROSCOPIC  PREGNANCY, URINE    EKG None  Radiology No results found.  Procedures Procedures   Medications Ordered in ED Medications  ketorolac (TORADOL) 15 MG/ML injection 15 mg (has no administration in time range)  diphenhydrAMINE (BENADRYL) injection 25 mg (has no administration in time range)  metoCLOPramide (REGLAN) injection 10 mg (has no administration in time range)  ondansetron (ZOFRAN) injection 4 mg (4 mg Intravenous Given 05/01/20 1357)  lactated ringers bolus  1,000 mL (1,000 mLs Intravenous New Bag/Given 05/01/20 1357)    ED Course  I have reviewed the triage vital signs and the nursing notes.  Pertinent labs that were available during my care of the patient were reviewed by me and considered in my medical decision making (see chart for details).    MDM Rules/Calculators/A&P                          Non-toxic on exam, mildly tachycardic but otherwise stable VS. Afebrile. Gave fluids, zofran. Labs show BG 244 with normal AG, no evidence of DKA, Na 133, K 3.4, normal LFTs and lipase. WBC 16.7, which may be indicative of viral/bacterial GI process.  She denies abdominal pain and has no focal abdominal tenderness therefore I do not feel she needs imaging at this time.  Still awaiting urinalysis.  On reassessment, she continues to have headache therefore have added migraine cocktail.  Patient signed out pending urinalysis and reassessment. Final Clinical Impression(s) / ED Diagnoses Final diagnoses:  None    Rx / DC Orders ED Discharge Orders    None       Shantele Reller, Ambrose Finland, MD 05/01/20 1451

## 2020-05-01 NOTE — ED Provider Notes (Signed)
  Physical Exam  BP 119/70   Pulse (!) 108   Temp 98.6 F (37 C) (Oral)   Resp 18   Ht 5\' 5"  (1.651 m)   Wt 108.4 kg   SpO2 95%   BMI 39.77 kg/m   Physical Exam  ED Course/Procedures     Procedures  MDM  Patient care assumed at 3 PM.  Patient is here with vomiting.  Has some headaches as well.  Signout pending migraine cocktail and urinalysis and p.o. trial  4:53 PM Tolerated ginger ale after migraine cocktail.  Patient has no meningeal signs.  Abdomen soft nontender.  Urinalysis showed some ketones but no UTI.  I think likely viral gastroenteritis.  Of note her white blood cell count is 17 but she has no abdominal tenderness to warrant a CT.  Will discharge home with Reglan as needed.  Told her to return to the ER if she has persistent vomiting, dehydration, severe abdominal pain, fever.     , MD 05/01/20 [phone]

## 2020-05-01 NOTE — ED Triage Notes (Addendum)
Pt c/o n/v/d, HA-sx started ~2am-NAD-to triage in w/c-her BS monitor reads her BS 241-pt states she was +covid 1/18

## 2020-05-03 ENCOUNTER — Other Ambulatory Visit: Payer: Self-pay | Admitting: *Deleted

## 2020-05-03 NOTE — Patient Outreach (Signed)
[facility]) Care Management  05/03/2020  Kelly Gallagher Mar 09, 1971 [id number]   Telephone screening  Referral received: 05/01/20 Referral source: Nurse call Line Reason : Patient with complaint of Nausea vomiting, dizziness, rapid heart rate in 120/s.   Subjective; Successful outreach call to patient for follow up on nurse line call on 05/01/20 Herm[place] reviewed concerns from yesterday related to nausea, vomiting increased heart rate ,call to nurse line and recommendations for ED visit.  She reports feeling much better on today, after receiving IV fluids. She discussed diagnosis likely viral  Gastroenteritis and dehydration. She reports tolerating liquids,and pushing liquids, broth, bland diet introducing foods slowly. She denies nausea vomiting,temperature 100.4 last night, down to 98.7 this morning. She reports relief of headache after reintroducing coffee back into her diet on this am.  She has been able to take medications as prescribed.  She reports that her blood sugar are back in her normal range for 80% of time per [employer].  Appointments Discussed follow up with provider after ED visit she plans to call today  to schedule follow appointment.   Discussed patient  Benefit of Active health management with health coach she confirms that she is enrolled in program and attending telephone visit. Discussed additional education needs, she is interested in information on managing diabetes when sick and information on diet , bland foods to introducing foods  back into diet after illness.  Plan No other ongoing care management needs identified, will close case to [facility] care management  Will send successful outreach letter, with EMMI handouts on Bland diet and Sick day management of Diabetes.    Egbert Garibaldi, RN, BSN  [facility] Care Management,Care Management Coordinator  [phone]- Mobile [phone]- Toll Free Main Office

## 2020-05-04 ENCOUNTER — Encounter: Payer: Self-pay | Admitting: Internal Medicine

## 2020-05-06 ENCOUNTER — Other Ambulatory Visit: Payer: Self-pay | Admitting: Student

## 2020-05-06 MED ORDER — BASAGLAR KWIKPEN 100 UNIT/ML ~~LOC~~ SOPN: PEN_INJECTOR | SUBCUTANEOUS | 5 refills | Status: DC

## 2020-05-06 MED FILL — BASAGLAR 100 UNIT/ML KWIKPE: 100 | 30 days supply | Qty: 30 | Fill #0

## 2020-05-08 ENCOUNTER — Ambulatory Visit: Payer: No Typology Code available for payment source

## 2020-05-09 ENCOUNTER — Encounter: Payer: Self-pay | Admitting: Internal Medicine

## 2020-05-13 ENCOUNTER — Encounter: Payer: No Typology Code available for payment source | Admitting: Internal Medicine

## 2020-05-13 NOTE — Progress Notes (Deleted)
   CC: ***  HPI:[name] is a 50 y.o. female who presents for evaluation of ***. Please see individual problem based A/P for details.  HPI:  Assessment: ***  Plan: ***  HPI:  Assessment: ***  Plan: ***  HPI:  Assessment: ***  Plan: ***   Depression, PHQ-9: Based on the patients  Flowsheet Row Office Visit from 03/28/2020 in [facility]  PHQ-9 Total Score 4     score we have ***.  Past Medical History:  Diagnosis Date  . Diabetes mellitus without complication (HCC)   . Hypertension    Review of Systems:   ROS   Physical Exam: There were no vitals filed for this visit.   General: *** HEENT: Normocephalic, atraumatic , Conjunctiva nl  Cardiovascular: Normal rate, regular rhythm.  No murmurs, rubs, or gallops Pulmonary : Equal breath sounds, No wheezes, rales, or rhonchi Abdominal: soft, nontender,  bowel sounds present   Assessment & Plan:   See Encounters Tab for problem based charting.  Patient {GC/GE:[id number]::"discussed with","seen with"} Dr. {XBWIO:[id number]::"B. Hoffman","Guilloud","Mullen","Narendra","Raines","Vincent","Williams"}

## 2020-05-14 ENCOUNTER — Encounter: Payer: Self-pay | Admitting: Dietician

## 2020-05-15 ENCOUNTER — Ambulatory Visit: Payer: No Typology Code available for payment source

## 2020-05-15 MED FILL — INVOKAMET XR 150-1,000 MG T: 150-1000 | 90 days supply | Qty: 180 | Fill #2

## 2020-05-18 MED FILL — FREESTYLE LIBRE 2 SENSOR MI: 28 days supply | Qty: 2 | Fill #1

## 2020-05-20 MED FILL — DULOXETINE HCL 60 MG CPEP: 60 | 30 days supply | Qty: 30 | Fill #3

## 2020-05-22 ENCOUNTER — Ambulatory Visit: Payer: No Typology Code available for payment source | Admitting: Physical Therapy

## 2020-05-22 MED FILL — METOPROLOL SUCCINATE ER 100: 100 | 90 days supply | Qty: 90 | Fill #2

## 2020-05-28 MED FILL — OZEMPIC (1 MG/DOSE) 4 MG/3M: 4 | 84 days supply | Qty: 9 | Fill #7

## 2020-05-29 ENCOUNTER — Encounter: Payer: No Typology Code available for payment source | Admitting: Physical Therapy

## 2020-05-30 MED FILL — BASAGLAR 100 UNIT/ML KWIKPE: 100 | 30 days supply | Qty: 30 | Fill #1

## 2020-06-06 ENCOUNTER — Telehealth: Payer: Self-pay

## 2020-06-06 DIAGNOSIS — E1142 Type 2 diabetes mellitus with diabetic polyneuropathy: Secondary | ICD-10-CM

## 2020-06-06 DIAGNOSIS — Z794 Long term (current) use of insulin: Secondary | ICD-10-CM

## 2020-06-06 NOTE — Telephone Encounter (Signed)
Received a fax from [facility] pharmacy which states Basaglar Stephanie Coup U-100 will be non-preferred as of 06/21/2020.  Asking MD to prescribe:  Semglee pen, Levemir Flextouch, or Tresiba Flextouch via [facility].  (Dr. Nicholaus Bloom recommended Evaristo Bury). Forwarding to [employer] team. Penne Lash, RN,BSN

## 2020-06-07 ENCOUNTER — Other Ambulatory Visit: Payer: Self-pay | Admitting: Internal Medicine

## 2020-06-07 MED ORDER — LEVEMIR FLEXTOUCH 100 UNIT/ML ~~LOC~~ SOPN: PEN_INJECTOR | SUBCUTANEOUS | 0 refills | Status: DC

## 2020-06-07 NOTE — Assessment & Plan Note (Signed)
Kelly Gallagher will no longer be preferred after 06/21/20. Transitioned to levemir at same dosing.

## 2020-06-11 ENCOUNTER — Other Ambulatory Visit (HOSPITAL_BASED_OUTPATIENT_CLINIC_OR_DEPARTMENT_OTHER): Payer: Self-pay

## 2020-06-14 ENCOUNTER — Other Ambulatory Visit: Payer: Self-pay | Admitting: Internal Medicine

## 2020-06-14 MED FILL — FREESTYLE LIBRE 2 SENSOR MI: 28 days supply | Qty: 2 | Fill #2

## 2020-06-18 ENCOUNTER — Other Ambulatory Visit: Payer: Self-pay | Admitting: *Deleted

## 2020-06-18 MED ORDER — NITROGLYCERIN 0.2 MG/HR TD PT24: MEDICATED_PATCH | TRANSDERMAL | 0 refills | Status: DC

## 2020-06-18 MED FILL — NITROGLYCERIN 0.2 MG/HR PTC: 0.2 | 32 days supply | Qty: 8 | Fill #1

## 2020-06-19 ENCOUNTER — Other Ambulatory Visit: Payer: Self-pay | Admitting: Internal Medicine

## 2020-06-19 MED FILL — LEVEMIR FLEXTOUCH 100 UNITS: 100 | 15 days supply | Qty: 15 | Fill #0

## 2020-06-19 MED FILL — DULOXETINE HCL 60 MG CPEP: 60 | 30 days supply | Qty: 30 | Fill #4

## 2020-06-26 ENCOUNTER — Other Ambulatory Visit (HOSPITAL_COMMUNITY): Payer: Self-pay

## 2020-06-26 ENCOUNTER — Other Ambulatory Visit: Payer: Self-pay | Admitting: Student

## 2020-06-26 MED FILL — Atorvastatin Calcium Tab 20 MG (Base Equivalent): ORAL | 90 days supply | Qty: 90 | Fill #0 | Status: AC

## 2020-06-26 MED FILL — Omeprazole Cap Delayed Release 20 MG: ORAL | 90 days supply | Qty: 90 | Fill #0 | Status: AC

## 2020-06-27 ENCOUNTER — Other Ambulatory Visit (HOSPITAL_COMMUNITY): Payer: Self-pay

## 2020-06-27 MED ORDER — BETAMETHASONE DIPROPIONATE 0.05 % EX CREA
TOPICAL_CREAM | Freq: Two times a day (BID) | CUTANEOUS | 0 refills | Status: DC
  Filled 2020-06-27: qty 30, 15d supply, fill #0

## 2020-06-27 MED ORDER — ALPRAZOLAM 0.5 MG PO TABS
ORAL_TABLET | Freq: Every day | ORAL | 0 refills | Status: DC | PRN
  Filled 2020-06-27: qty 30, 30d supply, fill #0

## 2020-06-27 NOTE — Telephone Encounter (Signed)
Can this be sent to an attending pool? I am not authorized for e-scripts on controlled substances. Thanks!

## 2020-06-28 ENCOUNTER — Other Ambulatory Visit (HOSPITAL_COMMUNITY): Payer: Self-pay

## 2020-07-15 ENCOUNTER — Encounter: Payer: Self-pay | Admitting: Internal Medicine

## 2020-07-24 ENCOUNTER — Encounter: Payer: Self-pay | Admitting: Internal Medicine

## 2020-07-24 ENCOUNTER — Other Ambulatory Visit (HOSPITAL_COMMUNITY): Payer: Self-pay

## 2020-07-24 ENCOUNTER — Ambulatory Visit (INDEPENDENT_AMBULATORY_CARE_PROVIDER_SITE_OTHER): Payer: No Typology Code available for payment source | Admitting: Internal Medicine

## 2020-07-24 ENCOUNTER — Other Ambulatory Visit: Payer: Self-pay

## 2020-07-24 VITALS — BP 124/73 | HR 72 | Temp 99.0°F | Ht 65.0 in | Wt 247.0 lb

## 2020-07-24 DIAGNOSIS — F419 Anxiety disorder, unspecified: Secondary | ICD-10-CM

## 2020-07-24 DIAGNOSIS — F411 Generalized anxiety disorder: Secondary | ICD-10-CM

## 2020-07-24 DIAGNOSIS — Z0000 Encounter for general adult medical examination without abnormal findings: Secondary | ICD-10-CM

## 2020-07-24 DIAGNOSIS — E1142 Type 2 diabetes mellitus with diabetic polyneuropathy: Secondary | ICD-10-CM | POA: Diagnosis not present

## 2020-07-24 DIAGNOSIS — Z794 Long term (current) use of insulin: Secondary | ICD-10-CM

## 2020-07-24 DIAGNOSIS — B373 Candidiasis of vulva and vagina: Secondary | ICD-10-CM | POA: Diagnosis not present

## 2020-07-24 DIAGNOSIS — I10 Essential (primary) hypertension: Secondary | ICD-10-CM

## 2020-07-24 DIAGNOSIS — B3731 Acute candidiasis of vulva and vagina: Secondary | ICD-10-CM

## 2020-07-24 DIAGNOSIS — R635 Abnormal weight gain: Secondary | ICD-10-CM | POA: Diagnosis not present

## 2020-07-24 LAB — GLUCOSE, CAPILLARY: Glucose-Capillary: 175 mg/dL — ABNORMAL HIGH (ref 70–99)

## 2020-07-24 LAB — POCT GLYCOSYLATED HEMOGLOBIN (HGB A1C): Hemoglobin A1C: 8 % — AB (ref 4.0–5.6)

## 2020-07-24 MED ORDER — OZEMPIC (2 MG/DOSE) 8 MG/3ML ~~LOC~~ SOPN
2.0000 mg | PEN_INJECTOR | SUBCUTANEOUS | 2 refills | Status: DC
  Filled 2020-07-24: qty 3, 28d supply, fill #0
  Filled 2020-07-29 – 2020-08-24 (×2): qty 3, 28d supply, fill #1
  Filled 2020-09-22: qty 3, 28d supply, fill #2

## 2020-07-24 MED ORDER — FLUCONAZOLE 150 MG PO TABS
150.0000 mg | ORAL_TABLET | Freq: Every day | ORAL | 2 refills | Status: AC
  Filled 2020-07-24: qty 6, 6d supply, fill #0

## 2020-07-24 NOTE — Patient Instructions (Signed)
Thank you for trusting me with your care. To recap, today we discussed the following:   1. Healthcare maintenance  - Ambulatory referral to Gastroenterology  2. Vaginal candidiasis  - fluconazole (DIFLUCAN) 150 MG tablet; Take 1 tablet (150 mg total) by mouth daily for 6 doses.  Dispense: 2 tablet; Refill: 2  3. Weight gain  - BMP8+Anion Gap - TSH  4. Controlled type 2 diabetes mellitus with diabetic polyneuropathy, with long-term current use of insulin (HCC) - Increase Semaglutide to 2 mg weekly dose . This should help with weight loss as well.  - POC Hbg A1C

## 2020-07-24 NOTE — Progress Notes (Signed)
   CC: Referral for screening colonoscopy, Weight gain, Type 2 Diabetes Mellitus, Hypertension  HPI:Kelly Gallagher is a 50 y.o. female who presents for evaluation of type 2 diabetes mellitus, weight gain, hypertension,  and referral for screening colonoscopy. Please see individual problem based A/P for details.   Past Medical History:  Diagnosis Date  . Diabetes mellitus without complication (HCC)   . Hypertension    Review of Systems:   ROS   Physical Exam: Vitals:   07/24/20 1419  BP: 124/73  Pulse: 72  Temp: 99 F (37.2 C)  TempSrc: Oral  SpO2: 99%  Weight: 247 lb (112 kg)  Height: 5\' 5"  (1.651 m)     General: Middle age women who wears glasses sitting in chair, pleasant affect and NAD HEENT: Conjunctiva nl , antiicteric sclerae, moist mucous membranes, no exudate or erythema Cardiovascular: Normal rate, regular rhythm.  No murmurs, rubs, or gallops Pulmonary : Equal breath sounds, No wheezes, rales, or rhonchi Abdominal: soft, nontender,  bowel sounds present Ext: No edema in lower extremities, no tenderness to palpation of lower extremities.  Gu: Examined area with chaperone on mons pubis,sub cm, no drainage or fluctuate. Appears to healed carbuncle   Assessment & Plan:   See Encounters Tab for problem based charting.  Patient discussed with Dr. 

## 2020-07-25 ENCOUNTER — Encounter: Payer: Self-pay | Admitting: Internal Medicine

## 2020-07-25 DIAGNOSIS — R635 Abnormal weight gain: Secondary | ICD-10-CM | POA: Insufficient documentation

## 2020-07-25 LAB — BMP8+ANION GAP
Anion Gap: 17 mmol/L (ref 10.0–18.0)
BUN/Creatinine Ratio: 22 (ref 9–23)
BUN: 14 mg/dL (ref 6–24)
CO2: 23 mmol/L (ref 20–29)
Calcium: 9.9 mg/dL (ref 8.7–10.2)
Chloride: 97 mmol/L (ref 96–106)
Creatinine, Ser: 0.64 mg/dL (ref 0.57–1.00)
Glucose: 183 mg/dL — ABNORMAL HIGH (ref 65–99)
Potassium: 4.1 mmol/L (ref 3.5–5.2)
Sodium: 137 mmol/L (ref 134–144)
eGFR: 109 mL/min/{1.73_m2} (ref >59)

## 2020-07-25 LAB — TSH: TSH: 1.72 u[IU]/mL (ref 0.450–4.500)

## 2020-07-25 NOTE — Assessment & Plan Note (Signed)
Weight /BMI 07/24/2020 05/01/2020 04/15/2020 03/28/2020 12/08/2019  WEIGHT 247 lb 239 lb 237 lb 239 lb 14.4 oz 247 lb 1.6 oz   See weight trend above. Patient has had demands as a caretaker recently and less time for exercise. She is going to work on increasing daily activity, starting with daily walks with her dog. Also pool aerobics when available. She is motivated and we are increasing her semaglutide for T2DM which may also help with weight loss. She has made diet changes in the past and doesn't feel this is an area she could maximize further at this time. She is on Insulin which she is aware is a challenge to weight loss. We will set goals at next visit and focusing on a daily aerobic activity until then.

## 2020-07-25 NOTE — Assessment & Plan Note (Addendum)
  Patient's BP today is 124/73. BMP wnl today. Assessment: Chronic, stable.  Plan: Continue Metoprolol Succinate 100 mg daily  Continue Olmesartan - HCTZ 20-12.5 daily

## 2020-07-25 NOTE — Assessment & Plan Note (Signed)
  Hgb A1c 7.9 % at the last visit. Current A1c 8%. Taking all medication as prescribed.   Assessment: Chronic and stable. Patient has had approximately 10 lbs of weight gain over 6 months by her record. Given her Hemoglobin A1c is above goal we will titrate up Semaglutide and added benefit of weight loss may be seen. Encourage to focus on a plan to increase aerobic activity. She reports a healthy diet based on prior recommendations from diabetic coordinator.  Plan: - POC Hbg A1C - Semaglutide, 2 MG/DOSE, (OZEMPIC, 2 MG/DOSE,) 8 MG/3ML SOPN; Inject 2 mg into the skin once a week.  Dispense: 3 mL; Refill: 2 - Continue Levemir 60 units am, 40 units pm - Continue Canaglifozin- Metformin ER [phone] BID with meals

## 2020-07-25 NOTE — Assessment & Plan Note (Signed)
-  referral for screening colonoscopy. 

## 2020-07-25 NOTE — Assessment & Plan Note (Addendum)
Patient presents for me to complete FMLA form. Her GAD is controlled on current regimen, but occasionally she has panic attacks. She needs mental health days periodically during the year and this has been helpful in preventing panic attack.s  Plan: - Completed FMLA - Continue Cymbalta -Continue Prn Xanax for Panic attacks. (PDMP reviewed and appropriate)  - Ambulatory referral to Integrated Behavioral Health

## 2020-07-25 NOTE — Assessment & Plan Note (Signed)
Hx of vaginal candidiasis. This occurs infrequent, enough to warrant continuing SGLT2 inhibitor. Request prescription for Diflucan. No active infection today. 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 Will prescribe PRN medication and continue to monitor frequency of infections at next visit.    - fluconazole (DIFLUCAN) 150 MG tablet; Take 1 tablet (150 mg total) by mouth daily for 6 doses.  Dispense: 2 tablet; Refill: 2

## 2020-07-26 ENCOUNTER — Other Ambulatory Visit (HOSPITAL_COMMUNITY): Payer: Self-pay

## 2020-07-26 NOTE — Progress Notes (Signed)
Internal Medicine Clinic Attending  Case discussed with Dr. Steen  At the time of the visit.  We reviewed the resident's history and exam and pertinent patient test results.  I agree with the assessment, diagnosis, and plan of care documented in the resident's note.  

## 2020-07-29 ENCOUNTER — Other Ambulatory Visit (HOSPITAL_COMMUNITY): Payer: Self-pay

## 2020-07-29 ENCOUNTER — Other Ambulatory Visit: Payer: Self-pay | Admitting: Student

## 2020-07-29 ENCOUNTER — Other Ambulatory Visit: Payer: Self-pay | Admitting: Internal Medicine

## 2020-07-29 ENCOUNTER — Other Ambulatory Visit: Payer: Self-pay | Admitting: Student in an Organized Health Care Education/Training Program

## 2020-07-30 ENCOUNTER — Other Ambulatory Visit (HOSPITAL_COMMUNITY): Payer: Self-pay

## 2020-07-30 MED ORDER — ALPRAZOLAM 0.5 MG PO TABS
ORAL_TABLET | Freq: Every day | ORAL | 0 refills | Status: DC | PRN
  Filled 2020-07-30: qty 30, 30d supply, fill #0

## 2020-07-30 MED ORDER — LEVEMIR FLEXTOUCH 100 UNIT/ML ~~LOC~~ SOPN
PEN_INJECTOR | SUBCUTANEOUS | 5 refills | Status: DC
  Filled 2020-07-30: qty 30, 30d supply, fill #0
  Filled 2020-08-24 – 2020-08-26 (×2): qty 30, 30d supply, fill #1
  Filled 2020-09-22: qty 30, 30d supply, fill #2

## 2020-07-30 MED ORDER — UNIFINE PENTIPS 32G X 4 MM MISC
11 refills | Status: AC
  Filled 2020-07-30 – 2020-09-24 (×3): qty 200, 90d supply, fill #0
  Filled 2021-01-02: qty 200, 90d supply, fill #1
  Filled 2021-03-26 (×2): qty 200, 90d supply, fill #2
  Filled 2021-06-25: qty 200, 90d supply, fill #3

## 2020-07-30 MED ORDER — BETAMETHASONE DIPROPIONATE 0.05 % EX CREA
TOPICAL_CREAM | Freq: Two times a day (BID) | CUTANEOUS | 2 refills | Status: AC
  Filled 2020-07-30: qty 30, 15d supply, fill #0
  Filled 2021-03-03: qty 30, 15d supply, fill #1
  Filled 2021-07-18: qty 30, 15d supply, fill #2

## 2020-08-02 ENCOUNTER — Encounter: Payer: Self-pay | Admitting: Internal Medicine

## 2020-08-16 ENCOUNTER — Encounter: Payer: No Typology Code available for payment source | Admitting: Internal Medicine

## 2020-08-16 ENCOUNTER — Ambulatory Visit (INDEPENDENT_AMBULATORY_CARE_PROVIDER_SITE_OTHER): Payer: No Typology Code available for payment source | Admitting: Obstetrics & Gynecology

## 2020-08-16 ENCOUNTER — Other Ambulatory Visit: Payer: Self-pay

## 2020-08-16 ENCOUNTER — Encounter: Payer: Self-pay | Admitting: Obstetrics & Gynecology

## 2020-08-16 VITALS — BP 116/60 | HR 75 | Ht 65.0 in | Wt 242.0 lb

## 2020-08-16 DIAGNOSIS — Z1211 Encounter for screening for malignant neoplasm of colon: Secondary | ICD-10-CM

## 2020-08-16 DIAGNOSIS — Z01419 Encounter for gynecological examination (general) (routine) without abnormal findings: Secondary | ICD-10-CM | POA: Diagnosis not present

## 2020-08-16 DIAGNOSIS — Z1231 Encounter for screening mammogram for malignant neoplasm of breast: Secondary | ICD-10-CM | POA: Diagnosis not present

## 2020-08-16 NOTE — Progress Notes (Signed)
Mammo: 10/27/2019 Pap: 08/24/2018 IUD placed: 06/01/2014

## 2020-08-16 NOTE — Progress Notes (Signed)
Subjective:     Kelly Gallagher is a 50 y.o. female here for a routine exam.  Current complaints:no GYN complaints. Moving back to [place] from [place]. Issues of dyspareunia resolved. No longer using topical EES. Has LnIUD.  Gynecologic History No LMP recorded. (Menstrual status: IUD). Contraception: IUD Last Pap: 08/24/2018. Results were: normal Last mammogram: 10/25/2019. Results were: normal  Obstetric History OB History  Gravida Para Term Preterm AB Living  1       1    SAB IAB Ectopic Multiple Live Births  1            # Outcome Date GA Lbr Len/2nd Weight Sex Delivery Anes PTL Lv  1 SAB             Obstetric Comments  "chemical pregnancy"     The following portions of the patient's history were reviewed and updated as appropriate: allergies, current medications, past family history, past medical history, past social history, past surgical history and problem list.  Review of Systems Pertinent items are noted in HPI.    Objective:  BP 116/60   Pulse 75   Ht 5\' 5"  (1.651 m)   Wt 242 lb (109.8 kg)   BMI 40.27 kg/m  General Appearance:    Alert, cooperative, no distress, appears stated age  Head:    Normocephalic, without obvious abnormality, atraumatic  Eyes:    conjunctiva/corneas clear, EOM's intact, both eyes  Ears:    Normal external ear canals, both ears  Nose:   Nares normal, septum midline, mucosa normal, no drainage    or sinus tenderness  Throat:   Lips, mucosa, and tongue normal; teeth and gums normal  Neck:   Supple, symmetrical, trachea midline, no adenopathy;    thyroid:  no enlargement/tenderness/nodules  Back:     Symmetric, no curvature, ROM normal, no CVA tenderness  Lungs:     respirations unlabored  Chest Wall:    No tenderness or deformity   Heart:    Regular rate and rhythm  Breast Exam:    No tenderness, masses, or nipple abnormality  Abdomen:     Soft, non-tender, bowel sounds active all four quadrants,    no masses, no organomegaly  Genitalia:    Normal  female without lesion, discharge or tenderness   IUD strings noted. Lubrication- good. WNL   Extremities:   Extremities normal, atraumatic, no cyanosis or edema  Pulses:   2+ and symmetric all extremities  Skin:   Skin color, texture, turgor normal, no rashes or lesions     Assessment:    Healthy female exam.   Vaginal dryness/ dyspareunia - resolved.     Plan:     Kalanie was seen today for gynecologic exam.  Diagnoses and all orders for this visit:  Well female exam with routine gynecological exam  Colon cancer screening -     Ambulatory referral to Gastroenterology  Breast cancer screening by mammogram -     MM 3D SCREEN BREAST BILATERAL; Future  f/u in 1 year or sooner prn   Omeka Holben L. Harraway-Smith, M.D., [facility]ble

## 2020-08-22 ENCOUNTER — Ambulatory Visit: Payer: No Typology Code available for payment source | Admitting: Behavioral Health

## 2020-08-22 ENCOUNTER — Other Ambulatory Visit: Payer: Self-pay

## 2020-08-22 DIAGNOSIS — Z87828 Personal history of other (healed) physical injury and trauma: Secondary | ICD-10-CM

## 2020-08-22 NOTE — BH Specialist Note (Signed)
Integrated Behavioral Health via Telemedicine Visit  08/22/2020 Kelly Gallagher [id number]  Number of Integrated Behavioral Health visits: 1/6 Session Start time: 11:00am  Session End time: 11:55am Total time: 49   Referring Provider: Dr. Albertha Ghee, MD Patient/Family location: Pt in private @ home [facility] Provider location: [facility] All persons participating in visit: Pt & Clinician Types of Service: Individual psychotherapy  I connected with [name] and/or Kathrine Haddock Manzi's self via  Telephone or Video Enabled Telemedicine Application  (Video is Caregility application) and verified that I am speaking with the correct person using two identifiers. Discussed confidentiality: Yes   I discussed the limitations of telemedicine and the availability of in person appointments.  Discussed there is a possibility of technology failure and discussed alternative modes of communication if that failure occurs.  I discussed that engaging in this telemedicine visit, they consent to the provision of behavioral healthcare and the services will be billed under their insurance.  Patient and/or legal guardian expressed understanding and consented to Telemedicine visit: Yes   Presenting Concerns: Patient and/or family reports the following symptoms/concerns: elevated anxiety lately due to trauma Hx & transitioning in life to reduced Caregiving responsibilities. Pt concerned for keeping on track w/weight loss. New skills needed to address triggering events in the present. Duration of problem: > 50yrs; Severity of problem: moderate  Patient and/or Family's Strengths/Protective Factors: Social connections, Social and Emotional competence, Concrete supports in place (healthy food, safe environments, etc.), Sense of purpose and Physical Health (exercise, healthy diet, medication compliance, etc.)  Goals Addressed: Patient will: 1.  Reduce symptoms of: anxiety, depression and stress  2.  Increase knowledge  and/or ability of: coping skills, healthy habits and stress reduction  3.  Demonstrate ability to: Increase healthy adjustment to current life circumstances  Progress towards Goals: Estb'd today: Pt will cont w/Therapy for several mos until trip in Aug to inc her coping tools & skills for sefl-regulation. Pt is hypervigilant & often anxious in triggering situations. Suggested to Pt she will deal w/some form of coping w/trauma's consequences for a time.   Interventions: Interventions utilized:  Motivational Interviewing, Solution-Focused Strategies, Behavioral Activation and Supportive Counseling Standardized Assessments completed: screeners prn  Patient and/or Family Response: Pt receptive to call & requests future appts  Assessment: Patient currently experiencing elevated anxiety & hypervigilance in instances of triggering. Pt feels she is healed from the trauma due to past Hx of psychotherapy. Suggested there may still be work we can do to inc her coping skills & process the trauma w/psychoedu & TIC.  Patient may benefit from addt'l skills & attn to Tx for trauma incidents 5 yrs ago.  Pt may also benefit from life transition happening w/Family in which she has reduced Caregiving duties presently.  Plan: 1. Follow up with behavioral health clinician on : 2-3 wks on telehealth for 60 min after June 17th 2. Behavioral recommendations: Record notes btwn our sessions as things come up for you to keep [place] focused & target our efforts. 3. Referral(s): Integrated [employer] (In Clinic)  I discussed the assessment and treatment plan with the patient and/or parent/guardian. They were provided an opportunity to ask questions and all were answered. They agreed with the plan and demonstrated an understanding of the instructions.   They were advised to call back or seek an in-person evaluation if the symptoms worsen or if the condition fails to improve as anticipated.  Deneise Lever, LMFT

## 2020-08-24 ENCOUNTER — Other Ambulatory Visit (HOSPITAL_COMMUNITY): Payer: Self-pay

## 2020-08-24 MED FILL — Olmesartan Medoxomil-Hydrochlorothiazide Tab 20-12.5 MG: ORAL | 90 days supply | Qty: 90 | Fill #0 | Status: AC

## 2020-08-24 MED FILL — Metoprolol Succinate Tab ER 24HR 100 MG (Tartrate Equiv): ORAL | 90 days supply | Qty: 90 | Fill #0 | Status: AC

## 2020-08-24 MED FILL — Duloxetine HCl Enteric Coated Pellets Cap 60 MG (Base Eq): ORAL | 30 days supply | Qty: 30 | Fill #0 | Status: AC

## 2020-08-24 MED FILL — Canagliflozin-Metformin HCl Tab ER 24HR 150-1000 MG: ORAL | 90 days supply | Qty: 180 | Fill #0 | Status: AC

## 2020-08-26 ENCOUNTER — Other Ambulatory Visit (HOSPITAL_COMMUNITY): Payer: Self-pay

## 2020-08-26 ENCOUNTER — Telehealth: Payer: Self-pay

## 2020-08-26 NOTE — Telephone Encounter (Signed)
Received the following fax from patient's insurance carrier, "[employer]":  The patient recently filled a RX for Invokamet XR [phone].  This medication is not formulary.  We request you convert the patient's RX to one of the preferred medications listed below:  Xigdulo XR Synjardy Synjardy XR Gayla Medicus to [facility] team.  Patient has an upcoming appt w/ Dr. Ephriam Knuckles on 09/05/20. SChaplin, RN,BSN

## 2020-08-27 NOTE — Telephone Encounter (Signed)
Ok. Will discuss at his visit with me.

## 2020-09-05 ENCOUNTER — Other Ambulatory Visit: Payer: Self-pay

## 2020-09-05 ENCOUNTER — Ambulatory Visit (INDEPENDENT_AMBULATORY_CARE_PROVIDER_SITE_OTHER): Payer: No Typology Code available for payment source | Admitting: Internal Medicine

## 2020-09-05 ENCOUNTER — Encounter: Payer: Self-pay | Admitting: Internal Medicine

## 2020-09-05 VITALS — BP 111/60 | HR 66 | Temp 98.0°F | Ht 66.0 in | Wt 239.7 lb

## 2020-09-05 DIAGNOSIS — Z0000 Encounter for general adult medical examination without abnormal findings: Secondary | ICD-10-CM | POA: Diagnosis not present

## 2020-09-05 NOTE — Progress Notes (Signed)
New Patient Office Visit  Subjective:  Patient ID: Kelly Gallagher, female    DOB: May 19, 1970  Age: 50 y.o. MRN: [id number]  CC:  Chief Complaint  Patient presents with   Annual Exam    No c/o's.    HPI Kelly Gallagher presents for [facility] Employee Physical. She has no acute complaints at today's visit.  Past Medical History:  Diagnosis Date   Diabetes mellitus without complication (HCC)    Hypertension     Past Surgical History:  Procedure Laterality Date   CHOLECYSTECTOMY     DILATION AND CURETTAGE OF UTERUS      Family History  Problem Relation Age of Onset   Cancer Father        Head and neck cancer. Has PEG   Lupus Sister    Breast cancer Other    Breast cancer Other     Social History   Socioeconomic History   Marital status: Married    Spouse name: Not on file   Number of children: Not on file   Years of education: Not on file   Highest education level: Not on file  Occupational History   Not on file  Tobacco Use   Smoking status: Never   Smokeless tobacco: Never  Vaping Use   Vaping Use: Never used  Substance and Sexual Activity   Alcohol use: Not Currently   Drug use: Never   Sexual activity: Not on file  Other Topics Concern   Not on file  Social History Narrative   Not on file   Social Determinants of Health   Financial Resource Strain: Not on file  Food Insecurity: Not on file  Transportation Needs: Not on file  Physical Activity: Not on file  Stress: Not on file  Social Connections: Not on file  Intimate Partner Violence: Not on file    ROS Review of Systems  Constitutional:  Negative for chills, fever and unexpected weight change.  HENT: Negative.    Eyes: Negative.   Respiratory:  Negative for shortness of breath and wheezing.   Cardiovascular:  Negative for chest pain and leg swelling.  Gastrointestinal:  Negative for abdominal pain and diarrhea.  Genitourinary: Negative.   Musculoskeletal: Negative.   Skin:  Negative  for rash and wound.  Neurological: Negative.   Psychiatric/Behavioral: Negative.     Objective:   Today's Vitals: BP 111/60 (BP Location: Right Arm, Patient Position: Sitting, Cuff Size: Normal)   Pulse 66   Temp 98 F (36.7 C) (Oral)   Ht 5\' 6"  (1.676 m)   Wt 239 lb 11.2 oz (108.7 kg)   SpO2 97% Comment: RA  BMI 38.69 kg/m   Physical Exam Constitutional:      Appearance: Normal appearance.  HENT:     Nose: No rhinorrhea.     Mouth/Throat:     Mouth: Mucous membranes are moist.  Eyes:     Extraocular Movements: Extraocular movements intact.     Conjunctiva/sclera: Conjunctivae normal.  Cardiovascular:     Rate and Rhythm: Normal rate and regular rhythm.  Pulmonary:     Effort: Pulmonary effort is normal.     Breath sounds: Normal breath sounds.  Abdominal:     General: Abdomen is flat.     Palpations: Abdomen is soft.  Musculoskeletal:     Right lower leg: No edema.     Left lower leg: No edema.  Skin:    General: Skin is warm and dry.  Neurological:  General: No focal deficit present.     Mental Status: She is alert and oriented to person, place, and time.  Psychiatric:        Mood and Affect: Mood normal.    Assessment & Plan:   Kelly Gallagher is a 50 year old female with chronic medical conditions including diabetes and hypertension who was seen for a [employer] Employee Physical Exam. She has no new complaints today.   Health maintenance She is up to date on age appropriate vaccinations She follows with GYN. She has an IUD for contraception. Last pap was in 2020--NILM, HPV negative.  She declines HCV and HIV screening at today's visit.   Obesity (BMI 38.69) She was recently started on Ozempic by her PCP and is working on titrating that up.    Outpatient Encounter Medications as of 09/05/2020  Medication Sig   Albuterol Sulfate 108 (90 Base) MCG/ACT AEPB Inhale 2 puffs into the lungs every 4 (four) hours as needed.   ALPRAZolam (XANAX) 0.5 MG tablet TAKE 1  TABLET BY MOUTH ONCE DAILY AS NEEDED FOR ANXIETY   atorvastatin (LIPITOR) 20 MG tablet TAKE 1 TABLET BY MOUTH ONCE A DAY   betamethasone dipropionate 0.05 % cream APPLY TO THE AFFECTED AREA(S) TWO TIMES DAILY   Canagliflozin-metFORMIN HCl ER [phone] MG TB24 TAKE 1 TABLET BY MOUTH TWICE DAILY WITH MEALS   Continuous Blood Gluc Receiver (FREESTYLE LIBRE 2 READER SYSTM) DEVI 1 each by Does not apply route 4 (four) times daily.   Continuous Blood Gluc Sensor (FREESTYLE LIBRE 2 SENSOR) MISC USE TO CHECK BLOOD SUGAR FOUR TIMES DAILY. CHANGE SENSOR EVERY 14 DAYS AS DIRECTED   DULoxetine (CYMBALTA) 60 MG capsule TAKE 1 CAPSULE BY MOUTH DAILY.   insulin detemir (LEVEMIR FLEXTOUCH) 100 UNIT/ML FlexPen INJECT 60 UNITS EVERY MORNING AND INJECT 40 UNITS EVERY EVENING   Insulin Pen Needle (UNIFINE PENTIPS) 32G X 4 MM MISC USE TO INJECT INSULIN INTO THE SKIN 2 TIMES DAILY   lidocaine (LIDODERM) 5 % Place 1 patch onto the skin daily. Remove & Discard patch within 12 hours or as directed by MD (Patient not taking: Reported on 08/16/2020)   metoprolol succinate (TOPROL-XL) 100 MG 24 hr tablet TAKE 1 TABLET BY MOUTH DAILY WITH OR IMMEDIATELY FOLLOWING A MEAL   Multiple Vitamin (MULTIVITAMIN) tablet Take 1 tablet by mouth daily.   olmesartan-hydrochlorothiazide (BENICAR HCT) 20-12.5 MG tablet TAKE 1 TABLET BY MOUTH ONCE A DAY   Omega-3 Fatty Acids (FISH OIL) 1000 MG CAPS Take by mouth.   omeprazole (PRILOSEC) 20 MG capsule TAKE 1 CAPSULE BY MOUTH ONCE A DAY   Probiotic Product (PROBIOTIC-10 PO) Take by mouth.   Semaglutide, 1 MG/DOSE, 4 MG/3ML SOPN INJECT 1 MG DOSE INTO THE SKIN ONCE A WEEK   Semaglutide, 2 MG/DOSE, (OZEMPIC, 2 MG/DOSE,) 8 MG/3ML SOPN Inject 2 mg into the skin once a week.   vitamin B-12 (CYANOCOBALAMIN) 1000 MCG tablet Take by mouth.   [DISCONTINUED] Insulin Glargine (BASAGLAR KWIKPEN) 100 UNIT/ML Inject 60 Units into the skin daily AND 40 Units at bedtime.   No facility-administered encounter  medications on file as of 09/05/2020.    Follow-up: with PCP for management of chronic medical conditions in 2 months  Elige Radon, MD Internal Medicine Resident PGY-2 [facility] Pager: [phone] 09/05/2020 9:29 AM

## 2020-09-05 NOTE — Patient Instructions (Signed)
It was wonderful seeing you again today and I hope you enjoy your new apartment!  Please plan to follow up with Dr. Barbaraann Faster as usual for your diabetes and hypertension.

## 2020-09-11 ENCOUNTER — Ambulatory Visit: Payer: No Typology Code available for payment source | Admitting: Behavioral Health

## 2020-09-11 DIAGNOSIS — F331 Major depressive disorder, recurrent, moderate: Secondary | ICD-10-CM

## 2020-09-11 DIAGNOSIS — F419 Anxiety disorder, unspecified: Secondary | ICD-10-CM

## 2020-09-11 DIAGNOSIS — R635 Abnormal weight gain: Secondary | ICD-10-CM

## 2020-09-11 NOTE — BH Specialist Note (Addendum)
Integrated Behavioral Health via Telemedicine Visit  09/11/2020 Kelly TOVEY [id number]  Number of Integrated Behavioral Health visits: 2/6 Session Start time: 9:00am  Session End time: 9:50am Total time: 50   Referring Provider: Dr. Albertha Ghee, MD Patient/Family location: Pt is in her work office in private [facility] Provider location: [facility] Office All persons participating in visit: Pt & Clinician Types of Service: Individual psychotherapy  I connected with [name] and/or Kathrine Haddock Maxham's  self  via  Telephone or Video Enabled Telemedicine Application  (Video is Caregility application) and verified that I am speaking with the correct person using two identifiers. Discussed confidentiality: Yes   I discussed the limitations of telemedicine and the availability of in person appointments.  Discussed there is a possibility of technology failure and discussed alternative modes of communication if that failure occurs.  I discussed that engaging in this telemedicine visit, they consent to the provision of behavioral healthcare and the services will be billed under their insurance.  Patient and/or legal guardian expressed understanding and consented to Telemedicine visit: Yes   Presenting Concerns: Patient and/or family reports the following symptoms/concerns: reduced Sx of anx/dep due to transitional move to [place] & Father is now living w/Pt's Str. 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 Pt is empowered by attending her Leadership Coaching sessions, & her mental & emot'l health are all improved.  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 & Family has made healthy transitions ; Severity of problem: mild  Patient and/or Family's Strengths/Protective Factors: Social connections, Social and  Emotional competence, Concrete supports in place (healthy food, safe environments, etc.), Sense of purpose, and Physical Health (exercise, healthy diet, medication compliance, etc.)  Goals Addressed: Patient will:  Reduce symptoms of: anxiety and depression; cont to manage panic attacks which have reduced in freq  Increase knowledge and/or ability of: coping skills, healthy habits, stress reduction, and self-care strategies/practices    Demonstrate ability to: Increase healthy adjustment to current life circumstances  Progress towards Goals: Ongoing  Interventions: Interventions utilized:  Solution-Focused Strategies, Supportive Counseling, and Psychoeducation and/or Health Education Standardized Assessments completed: Not Needed  Patient and/or Family Response: Pt receptive to call today & requests future appt in one month for ck-in  Assessment: Patient currently experiencing reduction in her Sx of anx/dep as Family has cooperated re: Dad's move into [employer] home. Family is grateful for Dad's remission status that is now considered 'cure' from cancer.  Patient may benefit from monthly ck-ins as she navigates the next few months.  Plan: Follow up with behavioral health clinician on : one month from now in July for 60 min ck-in telehealth Behavioral recommendations: Cont to work on self-care practices as they relate to exercise & management of weight; Pt reports she has lost 8 #! Referral(s): Integrated [employer] (In Clinic)  I discussed the assessment and treatment plan with the patient and/or parent/guardian. They were provided an opportunity to ask questions and all were answered. They agreed with the plan and demonstrated an understanding of the instructions.   They were advised to call back or seek an in-person evaluation if the symptoms worsen or if the condition fails to improve as anticipated.  Deneise Lever, LMFT

## 2020-09-17 ENCOUNTER — Other Ambulatory Visit (HOSPITAL_COMMUNITY): Payer: Self-pay

## 2020-09-22 ENCOUNTER — Other Ambulatory Visit: Payer: Self-pay | Admitting: Internal Medicine

## 2020-09-22 ENCOUNTER — Other Ambulatory Visit: Payer: Self-pay | Admitting: Student

## 2020-09-22 MED FILL — Continuous Glucose System Sensor: 28 days supply | Qty: 2 | Fill #0 | Status: AC

## 2020-09-22 MED FILL — Atorvastatin Calcium Tab 20 MG (Base Equivalent): ORAL | 90 days supply | Qty: 90 | Fill #1 | Status: AC

## 2020-09-22 MED FILL — Omeprazole Cap Delayed Release 20 MG: ORAL | 90 days supply | Qty: 90 | Fill #1 | Status: AC

## 2020-09-23 ENCOUNTER — Other Ambulatory Visit (HOSPITAL_COMMUNITY): Payer: Self-pay

## 2020-09-24 ENCOUNTER — Other Ambulatory Visit (HOSPITAL_COMMUNITY): Payer: Self-pay

## 2020-09-24 ENCOUNTER — Encounter: Payer: Self-pay | Admitting: *Deleted

## 2020-09-25 ENCOUNTER — Other Ambulatory Visit (HOSPITAL_COMMUNITY): Payer: Self-pay

## 2020-09-25 MED ORDER — ALPRAZOLAM 0.5 MG PO TABS
ORAL_TABLET | Freq: Every day | ORAL | 0 refills | Status: DC | PRN
  Filled 2020-09-25: qty 30, 30d supply, fill #0

## 2020-09-25 MED ORDER — DULOXETINE HCL 60 MG PO CPEP
ORAL_CAPSULE | Freq: Every day | ORAL | 5 refills | Status: DC
  Filled 2020-09-25: qty 30, 30d supply, fill #0
  Filled 2020-10-23: qty 30, 30d supply, fill #1
  Filled 2020-11-22: qty 30, 30d supply, fill #2
  Filled 2021-01-02: qty 30, 30d supply, fill #3
  Filled 2021-02-03: qty 30, 30d supply, fill #4
  Filled 2021-03-03: qty 30, 30d supply, fill #5

## 2020-09-26 ENCOUNTER — Other Ambulatory Visit (HOSPITAL_COMMUNITY): Payer: Self-pay

## 2020-10-16 ENCOUNTER — Other Ambulatory Visit: Payer: Self-pay

## 2020-10-16 ENCOUNTER — Emergency Department (HOSPITAL_BASED_OUTPATIENT_CLINIC_OR_DEPARTMENT_OTHER)
Admission: EM | Admit: 2020-10-16 | Discharge: 2020-10-17 | Disposition: A | Discharge status: Home / Self Care | Payer: No Typology Code available for payment source | Attending: Emergency Medicine | Admitting: Emergency Medicine

## 2020-10-16 ENCOUNTER — Encounter: Payer: Self-pay | Admitting: Internal Medicine

## 2020-10-16 ENCOUNTER — Encounter (HOSPITAL_BASED_OUTPATIENT_CLINIC_OR_DEPARTMENT_OTHER): Payer: Self-pay | Admitting: *Deleted

## 2020-10-16 DIAGNOSIS — R112 Nausea with vomiting, unspecified: Secondary | ICD-10-CM | POA: Insufficient documentation

## 2020-10-16 DIAGNOSIS — E119 Type 2 diabetes mellitus without complications: Secondary | ICD-10-CM | POA: Diagnosis not present

## 2020-10-16 DIAGNOSIS — X58XXXA Exposure to other specified factors, initial encounter: Secondary | ICD-10-CM | POA: Insufficient documentation

## 2020-10-16 DIAGNOSIS — U071 COVID-19: Secondary | ICD-10-CM | POA: Insufficient documentation

## 2020-10-16 DIAGNOSIS — Z79899 Other long term (current) drug therapy: Secondary | ICD-10-CM | POA: Diagnosis not present

## 2020-10-16 DIAGNOSIS — I10 Essential (primary) hypertension: Secondary | ICD-10-CM | POA: Insufficient documentation

## 2020-10-16 DIAGNOSIS — T7840XA Allergy, unspecified, initial encounter: Secondary | ICD-10-CM | POA: Insufficient documentation

## 2020-10-16 DIAGNOSIS — Z7984 Long term (current) use of oral hypoglycemic drugs: Secondary | ICD-10-CM | POA: Insufficient documentation

## 2020-10-16 DIAGNOSIS — Z794 Long term (current) use of insulin: Secondary | ICD-10-CM | POA: Insufficient documentation

## 2020-10-16 LAB — CBC WITH DIFFERENTIAL/PLATELET
Abs Immature Granulocytes: 0.14 10*3/uL — ABNORMAL HIGH (ref 0.00–0.07)
Basophils Absolute: 0.1 10*3/uL (ref 0.0–0.1)
Basophils Relative: 0 %
Eosinophils Absolute: 0.1 10*3/uL (ref 0.0–0.5)
Eosinophils Relative: 1 %
HCT: 49.7 % — ABNORMAL HIGH (ref 36.0–46.0)
Hemoglobin: 16.7 g/dL — ABNORMAL HIGH (ref 12.0–15.0)
Immature Granulocytes: 1 %
Lymphocytes Relative: 7 %
Lymphs Abs: 1.8 10*3/uL (ref 0.7–4.0)
MCH: 28.3 pg (ref 26.0–34.0)
MCHC: 33.6 g/dL (ref 30.0–36.0)
MCV: 84.2 fL (ref 80.0–100.0)
Monocytes Absolute: 1.1 10*3/uL — ABNORMAL HIGH (ref 0.1–1.0)
Monocytes Relative: 5 %
Neutro Abs: 20.9 10*3/uL — ABNORMAL HIGH (ref 1.7–7.7)
Neutrophils Relative %: 86 %
Platelets: 346 10*3/uL (ref 150–400)
RBC: 5.9 MIL/uL — ABNORMAL HIGH (ref 3.87–5.11)
RDW: 12.6 % (ref 11.5–15.5)
WBC: 24.1 10*3/uL — ABNORMAL HIGH (ref 4.0–10.5)
nRBC: 0 % (ref 0.0–0.2)

## 2020-10-16 MED ORDER — SODIUM CHLORIDE 0.9 % IV BOLUS
1000.0000 mL | Freq: Once | INTRAVENOUS | Status: AC
  Administered 2020-10-16: 1000 mL via INTRAVENOUS

## 2020-10-16 MED ORDER — ONDANSETRON HCL 4 MG/2ML IJ SOLN
4.0000 mg | Freq: Once | INTRAMUSCULAR | Status: AC
  Administered 2020-10-16: 4 mg via INTRAVENOUS
  Filled 2020-10-16: qty 2

## 2020-10-16 NOTE — ED Triage Notes (Signed)
Covid positive today , sx started today , pt reports tongue swelling and hives at 930pm called EMS , EMS gave benadryl 50mg  po x 30 mins ago

## 2020-10-17 ENCOUNTER — Ambulatory Visit: Payer: No Typology Code available for payment source | Admitting: Behavioral Health

## 2020-10-17 ENCOUNTER — Encounter: Payer: Self-pay | Admitting: *Deleted

## 2020-10-17 LAB — LIPASE, BLOOD: Lipase: 25 U/L (ref 11–51)

## 2020-10-17 LAB — COMPREHENSIVE METABOLIC PANEL
ALT: 24 U/L (ref 0–44)
AST: 18 U/L (ref 15–41)
Albumin: 4.3 g/dL (ref 3.5–5.0)
Alkaline Phosphatase: 45 U/L (ref 38–126)
Anion gap: 16 — ABNORMAL HIGH (ref 5–15)
BUN: 14 mg/dL (ref 6–20)
CO2: 23 mmol/L (ref 22–32)
Calcium: 9.5 mg/dL (ref 8.9–10.3)
Chloride: 97 mmol/L — ABNORMAL LOW (ref 98–111)
Creatinine, Ser: 0.65 mg/dL (ref 0.44–1.00)
GFR, Estimated: >60 mL/min (ref >60)
Glucose, Bld: 232 mg/dL — ABNORMAL HIGH (ref 70–99)
Potassium: 3.4 mmol/L — ABNORMAL LOW (ref 3.5–5.1)
Sodium: 136 mmol/L (ref 135–145)
Total Bilirubin: 1.4 mg/dL — ABNORMAL HIGH (ref 0.3–1.2)
Total Protein: 7.2 g/dL (ref 6.5–8.1)

## 2020-10-17 MED ORDER — ONDANSETRON 4 MG PO TBDP: ORAL_TABLET | ORAL | 0 refills | Status: DC

## 2020-10-17 MED ORDER — DEXAMETHASONE 6 MG PO TABS
10.0000 mg | ORAL_TABLET | Freq: Once | ORAL | Status: AC
  Administered 2020-10-17: 10 mg via ORAL
  Filled 2020-10-17: qty 1

## 2020-10-17 MED ORDER — BENZONATATE 100 MG PO CAPS: 100.0000 mg | ORAL_CAPSULE | Freq: Three times a day (TID) | ORAL | 0 refills | Status: DC

## 2020-10-17 NOTE — ED Provider Notes (Signed)
[facility] Provider Note   CSN: [id number] Arrival date & time: 10/16/20  2309     History Chief Complaint  Patient presents with   Vomiting   Covid Positive    Kelly Gallagher is a 50 y.o. female.  50 yo F with a chief complaints of hives and a sensation like her tongue swelling and nausea and vomiting.  She called EMS and was given Benadryl with some improvement.  Denies any difficulty breathing denies feeling like she may pass out.  Tested positive for COVID today as well.  Took Tylenol and ibuprofen prior to her symptoms, denies any suspicious food today.  The history is provided by the patient.  Illness Severity:  Moderate Onset quality:  Gradual Duration:  6 hours Timing:  Constant Progression:  Partially resolved Chronicity:  New Associated symptoms: no chest pain, no congestion, no fever, no headaches, no myalgias, no nausea, no rhinorrhea, no shortness of breath, no vomiting and no wheezing       Past Medical History:  Diagnosis Date   Diabetes mellitus without complication ([place])    Hypertension     Patient Active Problem List   Diagnosis Date Noted   Weight gain 07/25/2020   Tinea corporis 03/29/2020   Tendinopathy of left rotator cuff 09/18/2019   Impingement syndrome of left shoulder 06/26/2019   Musculoskeletal pain 04/29/2018   Healthcare maintenance 04/29/2018   DM (diabetes mellitus) type II controlled, 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[place]) 04/28/2018   Hypertension 04/28/2018   Generalized anxiety disorder 04/28/2018   Hyperlipidemia 04/28/2018   Morbid obesity ([place]) 04/28/2018   IUD (intrauterine device) in place 04/28/2018   GERD (gastroesophageal reflux disease) 04/28/2018   Reactive airway disease 04/28/2018   Vitamin B12 deficiency 04/28/2018    Past Surgical History:  Procedure Laterality Date   CHOLECYSTECTOMY     DILATION AND CURETTAGE OF UTERUS       OB History     Gravida  1   Para      Term       Preterm      AB  1   Living         SAB  1   IAB      Ectopic      Multiple      Live Births           Obstetric Comments  "chemical pregnancy"         Family History  Problem Relation Age of Onset   Cancer Father        Head and neck cancer. Has PEG   Lupus Sister    Breast cancer Other    Breast cancer Other     Social History   Tobacco Use   Smoking status: Never   Smokeless tobacco: Never  Vaping Use   Vaping Use: Never used  Substance Use Topics   Alcohol use: Not Currently   Drug use: Never    Home Medications Prior to Admission medications   Medication Sig Start Date End Date Taking? Authorizing Provider  benzonatate (TESSALON) 100 MG capsule Take 1 capsule (100 mg total) by mouth every 8 (eight) hours. 10/17/20  Yes Deno Etienne, DO  ondansetron (ZOFRAN ODT) 4 MG disintegrating tablet 4mg  ODT q4 hours prn nausea/vomit 10/17/20  Yes Deno Etienne, DO  Albuterol Sulfate 108 (90 Base) MCG/ACT AEPB Inhale 2 puffs into the lungs every 4 (four) hours as needed. 04/28/18   Bartholomew Crews, MD  ALPRAZolam Duanne Moron)  0.5 MG tablet Take 1 tablet by mouth daily as needed for anxiety. 09/25/20 03/24/21  Jose Persia, MD  atorvastatin (LIPITOR) 20 MG tablet TAKE 1 TABLET BY MOUTH ONCE A DAY 04/02/20 04/02/21  Bloomfield, Carley D, DO  betamethasone dipropionate 0.05 % cream APPLY TO THE AFFECTED AREA(S) TWO TIMES DAILY 07/30/20 07/30/21  Andrew Au, MD  Canagliflozin-metFORMIN HCl ER [phone] MG TB24 TAKE 1 TABLET BY MOUTH TWICE DAILY WITH MEALS 11/23/19 11/24/20  Alexandria Lodge, MD  Continuous Blood Gluc Receiver (FREESTYLE LIBRE 2 READER SYSTM) DEVI 1 each by Does not apply route 4 (four) times daily. 09/14/19   Velna Ochs, MD  Continuous Blood Gluc Sensor (FREESTYLE LIBRE 2 SENSOR) MISC USE TO CHECK BLOOD SUGAR FOUR TIMES DAILY. CHANGE SENSOR EVERY 14 DAYS AS DIRECTED 04/25/20 04/25/21  Andrew Au, MD  DULoxetine (CYMBALTA) 60 MG capsule Take 1 capsule by mouth  daily. 09/25/20 09/25/21  Jose Persia, MD  insulin detemir (LEVEMIR FLEXTOUCH) 100 UNIT/ML FlexPen INJECT 60 UNITS EVERY MORNING AND INJECT 40 UNITS EVERY EVENING 07/30/20 07/30/21  Andrew Au, MD  Insulin Pen Needle (UNIFINE PENTIPS) 32G X 4 MM MISC USE TO INJECT INSULIN INTO THE SKIN 2 TIMES DAILY 07/30/20 07/30/21  Andrew Au, MD  lidocaine (LIDODERM) 5 % Place 1 patch onto the skin daily. Remove & Discard patch within 12 hours or as directed by MD Patient not taking: Reported on 08/16/2020 11/20/19   Madalyn Rob, MD  metoprolol succinate (TOPROL-XL) 100 MG 24 hr tablet TAKE 1 TABLET BY MOUTH DAILY WITH OR IMMEDIATELY FOLLOWING A MEAL 11/30/19 11/29/20  Alexandria Lodge, MD  Multiple Vitamin (MULTIVITAMIN) tablet Take 1 tablet by mouth daily.    [provider]  olmesartan-hydrochlorothiazide (BENICAR HCT) 20-12.5 MG tablet TAKE 1 TABLET BY MOUTH ONCE A DAY 01/25/20 01/24/21  Andrew Au, MD  Omega-3 Fatty Acids (FISH OIL) 1000 MG CAPS Take by mouth.    [provider]  omeprazole (PRILOSEC) 20 MG capsule TAKE 1 CAPSULE BY MOUTH ONCE A DAY 04/02/20 04/02/21  Bloomfield, Carley D, DO  Probiotic Product (PROBIOTIC-10 PO) Take by mouth.    [provider]  Semaglutide, 1 MG/DOSE, 4 MG/3ML SOPN INJECT 1 MG DOSE INTO THE SKIN ONCE A WEEK 11/20/19 11/19/20  Madalyn Rob, MD  Semaglutide, 2 MG/DOSE, (OZEMPIC, 2 MG/DOSE,) 8 MG/3ML SOPN Inject 2 mg into the skin once a week. 07/24/20   Madalyn Rob, MD  vitamin B-12 (CYANOCOBALAMIN) 1000 MCG tablet Take by mouth.    [provider]  Insulin Glargine (BASAGLAR KWIKPEN) 100 UNIT/ML Inject 60 Units into the skin daily AND 40 Units at bedtime. 05/06/20 06/07/20  Andrew Au, MD    Allergies    Ace inhibitors and Effexor xr [venlafaxine hcl]  Review of Systems   Review of Systems  Constitutional:  Negative for chills and fever.  HENT:  Negative for congestion and rhinorrhea.   Eyes:  Negative for redness and visual  disturbance.  Respiratory:  Negative for shortness of breath and wheezing.   Cardiovascular:  Negative for chest pain and palpitations.  Gastrointestinal:  Negative for nausea and vomiting.  Genitourinary:  Negative for dysuria and urgency.  Musculoskeletal:  Negative for arthralgias and myalgias.  Skin:  Negative for pallor and wound.  Neurological:  Negative for dizziness and headaches.   Physical Exam Updated Vital Signs BP 130/87 (BP Location: Left Arm)   Pulse (!) 109   Temp 97.6 F (36.4 C) (Oral)   Resp  18   Ht $R'5\' 5"'hR$  (1.651 m)   Wt 104.3 kg   SpO2 96%   BMI 38.27 kg/m   Physical Exam Vitals and nursing note reviewed.  Constitutional:      General: She is not in acute distress.    Appearance: She is well-developed. She is not diaphoretic.  HENT:     Head: Normocephalic and atraumatic.  Eyes:     Pupils: Pupils are equal, round, and reactive to light.  Cardiovascular:     Rate and Rhythm: Normal rate and regular rhythm.     Heart sounds: No murmur heard.   No friction rub. No gallop.  Pulmonary:     Effort: Pulmonary effort is normal.     Breath sounds: No wheezing or rales.  Abdominal:     General: There is no distension.     Palpations: Abdomen is soft.     Tenderness: There is no abdominal tenderness.  Musculoskeletal:        General: No tenderness.     Cervical back: Normal range of motion and neck supple.  Skin:    General: Skin is warm and dry.  Neurological:     Mental Status: She is alert and oriented to person, place, and time.  Psychiatric:        Behavior: Behavior normal.    ED Results / Procedures / Treatments   Labs (all labs ordered are listed, but only abnormal results are displayed) Labs Reviewed  CBC WITH DIFFERENTIAL/PLATELET - Abnormal; Notable for the following components:      Result Value   WBC 24.1 (*)    RBC 5.90 (*)    Hemoglobin 16.7 (*)    HCT 49.7 (*)    Neutro Abs 20.9 (*)    Monocytes Absolute 1.1 (*)    Abs Immature  Granulocytes 0.14 (*)    All other components within normal limits  COMPREHENSIVE METABOLIC PANEL - Abnormal; Notable for the following components:   Potassium 3.4 (*)    Chloride 97 (*)    Glucose, Bld 232 (*)    Total Bilirubin 1.4 (*)    Anion gap 16 (*)    All other components within normal limits  LIPASE, BLOOD    EKG None  Radiology No results found.  Procedures Procedures   Medications Ordered in ED Medications  sodium chloride 0.9 % bolus 1,000 mL (0 mLs Intravenous Stopped 10/17/20 0053)  ondansetron (ZOFRAN) injection 4 mg (4 mg Intravenous Given 10/16/20 2353)  dexamethasone (DECADRON) tablet 10 mg (10 mg Oral Given 10/17/20 0050)    ED Course  I have reviewed the triage vital signs and the nursing notes.  Pertinent labs & imaging results that were available during my care of the patient were reviewed by me and considered in my medical decision making (see chart for details).    MDM Rules/Calculators/A&P                           50 yo F with a chief complaints of nausea vomiting rash and feeling like her tongue is swollen.  Most likely an allergic reaction, not sure what her COVID infection has to play with this.  Most likely medicine that she took that would cause a reaction like this would be ibuprofen though no prior history of the same.  She technically met the definition for anaphylaxis initially though upon arrival here after only Benadryl she has no persistent rash no tongue swelling and  is no longer throwing up or having diarrhea.  She was observed in the ED for about 2 hours.  Blood work with a significant leukocytosis but otherwise no concerning finding.  I did offer COVID-19 specific treatment due to her obesity which she is declining.  12:54 AM:  I have discussed the diagnosis/risks/treatment options with the patient and believe the pt to be eligible for discharge home to follow-up with PCP. We also discussed returning to the ED immediately if new or  worsening sx occur. We discussed the sx which are most concerning (e.g., sudden worsening pain, fever, inability to tolerate by mouth) that necessitate immediate return. Medications administered to the patient during their visit and any new prescriptions provided to the patient are listed below.  Medications given during this visit Medications  sodium chloride 0.9 % bolus 1,000 mL (0 mLs Intravenous Stopped 10/17/20 0053)  ondansetron (ZOFRAN) injection 4 mg (4 mg Intravenous Given 10/16/20 2353)  dexamethasone (DECADRON) tablet 10 mg (10 mg Oral Given 10/17/20 0050)     The patient appears reasonably screen and/or stabilized for discharge and I doubt any other medical condition or other [facility] requiring further screening, evaluation, or treatment in the ED at this time prior to discharge.   Final Clinical Impression(s) / ED Diagnoses Final diagnoses:  BOFBP-10 virus infection  Allergic reaction, initial encounter    Rx / DC Orders ED Discharge Orders          Ordered    benzonatate (TESSALON) 100 MG capsule  Every 8 hours        10/17/20 0043    ondansetron (ZOFRAN ODT) 4 MG disintegrating tablet        10/17/20 Stewart, [place], DO 10/17/20 2585

## 2020-10-17 NOTE — Discharge Instructions (Addendum)
Take tylenol 2 pills 4 times a day and motrin 4 pills 3 times a day.  Drink plenty of fluids.  Return for worsening shortness of breath, headache, confusion. Follow up with your family doctor.    I am not sure what caused her symptoms.  Please keep an eye on the foods you drink and the medicines you take.  This happens again then you should stop your medication and return to the [facility] apartment if you have difficulty breathing swallowing.

## 2020-10-18 ENCOUNTER — Ambulatory Visit (INDEPENDENT_AMBULATORY_CARE_PROVIDER_SITE_OTHER): Payer: No Typology Code available for payment source | Admitting: Internal Medicine

## 2020-10-18 ENCOUNTER — Telehealth: Payer: Self-pay | Admitting: *Deleted

## 2020-10-18 DIAGNOSIS — U071 COVID-19: Secondary | ICD-10-CM | POA: Insufficient documentation

## 2020-10-18 DIAGNOSIS — E1142 Type 2 diabetes mellitus with diabetic polyneuropathy: Secondary | ICD-10-CM

## 2020-10-18 DIAGNOSIS — Z794 Long term (current) use of insulin: Secondary | ICD-10-CM

## 2020-10-18 NOTE — Assessment & Plan Note (Signed)
Test positive with at home COVID kit 10/14/20. Symptoms include- nausea, vomiting, myalgias, loss of taste/smell, dry cough and fever of 100.1. Today (10/18/20) she reports improvement of her symptoms. Only experiencing minor body aches and one episode of nocturnal fever. Denies SOB or chest pain.  PLAN: Continue fluid intake for hydration Take tylenol and/or aleve - alternating as needed for symptom relief Continue to monitor glucose levels Remain quarantined for 5 days Call the office if symptoms worsen

## 2020-10-18 NOTE — Telephone Encounter (Signed)
Called patient left voice message regarding to complete her tele health visit. Asked patient to return call before 12 noon, due to clinic closes at 12:00p.

## 2020-10-18 NOTE — Assessment & Plan Note (Signed)
Kelly Gallagher reports increase in blood glucose since discharge from ED 10/14/20. Readings yesterday 10/15/20 ranges from 200-300s. She was given dexamethasone 10mg  in ED which can cause rise in glucose levels as side affect profile.   PLAN: Advised to monitor glucoes levels and if readings don't improve; call the office

## 2020-10-18 NOTE — Progress Notes (Signed)
   CC: COVID pos-symptomatic  This is a telephone encounter between Kelly Gallagher and Kelly Gallagher on 10/18/2020 for COVID positive symptoms. The visit was conducted with the patient located at home and Kelly Gallagher at [facility]. The patient's identity was confirmed using their DOB and current address. The patient has consented to being evaluated through a telephone encounter and understands the associated risks (an examination cannot be done and the patient may need to come in for an appointment) / benefits (allows the patient to remain at home, decreasing exposure to coronavirus). I personally spent 25 minutes on medical discussion.   HPI:  KellyKelly Gallagher is a 50 y.o. with PMH as below. Kelly Gallagher states that on 10/14/20 she began experiencing nausea, vomiting, diarrhea, fever of 100.1 and body aches. She took an at home COVID test which was positive. She began alternating tylenol and ibuprofen in attempts to relieve her symptoms. Later in the day, she suddenly notices diffuse pruritic hives on her body and a "swollen tongue" sensation which prompted her to call EMS. En route to the ED she was given benadryl. In the ED she received 10mg  dexamethasone, Zofran and IV fluids. Her hives, vomiting and tongue sensation since resolved. On telehealth visit today, Kelly Gallagher reports resolution of her nausea, vomiting and diarrhea. She reports minor body aches, low grade nocturnal fever and dry cough. Denies SOB, chest pain, abdominal pain, or diarrhea. She reports increased appetite, increasing fluid intake and overall improvement of her symptoms. She is quarantined in her bedroom of her home.  Please see A&P for assessment of the patient's acute and chronic medical conditions.   Past Medical History:  Diagnosis Date   Diabetes mellitus without complication (HCC)    Hypertension    Review of Systems  Constitutional:  Positive for fever. Negative for chills.  HENT:         Reports lack of taste and smell    Eyes:  Negative for blurred vision and double vision.  Respiratory:  Positive for cough. Negative for sputum production, shortness of breath and wheezing.   Cardiovascular:  Negative for chest pain and palpitations.  Gastrointestinal:  Negative for abdominal pain, constipation, diarrhea, heartburn, nausea and vomiting.  Musculoskeletal:  Positive for myalgias.  Skin:  Negative for itching and rash.  Neurological:  Negative for dizziness, loss of consciousness and headaches.      Assessment & Plan:   See Encounters Tab for problem based charting.  Patient discussed with Dr. Earlene Plater Internal Medicine Resident

## 2020-10-23 ENCOUNTER — Other Ambulatory Visit: Payer: Self-pay | Admitting: Internal Medicine

## 2020-10-23 ENCOUNTER — Other Ambulatory Visit: Payer: Self-pay | Admitting: Student

## 2020-10-23 ENCOUNTER — Other Ambulatory Visit (HOSPITAL_COMMUNITY): Payer: Self-pay

## 2020-10-23 ENCOUNTER — Other Ambulatory Visit: Payer: Self-pay

## 2020-10-23 ENCOUNTER — Encounter: Payer: Self-pay | Admitting: Internal Medicine

## 2020-10-23 DIAGNOSIS — E1142 Type 2 diabetes mellitus with diabetic polyneuropathy: Secondary | ICD-10-CM

## 2020-10-23 DIAGNOSIS — R635 Abnormal weight gain: Secondary | ICD-10-CM

## 2020-10-23 DIAGNOSIS — Z794 Long term (current) use of insulin: Secondary | ICD-10-CM

## 2020-10-23 MED ORDER — OZEMPIC (2 MG/DOSE) 8 MG/3ML ~~LOC~~ SOPN
2.0000 mg | PEN_INJECTOR | SUBCUTANEOUS | 2 refills | Status: DC
Start: 2020-10-23 — End: 2021-02-25
  Filled 2020-10-23: qty 3, 28d supply, fill #0
  Filled 2020-12-16: qty 3, 28d supply, fill #1
  Filled 2021-01-30: qty 3, 28d supply, fill #2

## 2020-10-23 NOTE — Progress Notes (Signed)
-   referral placed to [facility].

## 2020-10-23 NOTE — Telephone Encounter (Signed)
I hope you are doing well as I saw you had a positive Covid test. Referral placed. My best.

## 2020-10-24 ENCOUNTER — Other Ambulatory Visit (HOSPITAL_COMMUNITY): Payer: Self-pay

## 2020-10-24 MED FILL — Insulin Detemir Soln Pen-injector 100 Unit/ML: SUBCUTANEOUS | 30 days supply | Qty: 30 | Fill #0 | Status: AC

## 2020-10-24 MED FILL — Insulin Detemir Soln Pen-injector 100 Unit/ML: SUBCUTANEOUS | Qty: 15 | Fill #0 | Status: CN

## 2020-10-31 ENCOUNTER — Other Ambulatory Visit (HOSPITAL_COMMUNITY): Payer: Self-pay

## 2020-11-18 ENCOUNTER — Telehealth: Payer: Self-pay | Admitting: *Deleted

## 2020-11-18 ENCOUNTER — Other Ambulatory Visit: Payer: Self-pay

## 2020-11-18 ENCOUNTER — Ambulatory Visit
Admission: RE | Admit: 2020-11-18 | Discharge: 2020-11-18 | Disposition: A | Discharge status: Home / Self Care | Payer: No Typology Code available for payment source | Source: Ambulatory Visit | Attending: Obstetrics & Gynecology | Admitting: Obstetrics & Gynecology

## 2020-11-18 DIAGNOSIS — Z1231 Encounter for screening mammogram for malignant neoplasm of breast: Secondary | ICD-10-CM

## 2020-11-18 NOTE — Telephone Encounter (Signed)
Unable to reach for virtual pre visit. VM left requesting return call.

## 2020-11-19 ENCOUNTER — Other Ambulatory Visit (HOSPITAL_COMMUNITY): Payer: Self-pay

## 2020-11-19 MED FILL — Continuous Glucose System Sensor: 28 days supply | Qty: 2 | Fill #1 | Status: AC

## 2020-11-22 ENCOUNTER — Other Ambulatory Visit: Payer: Self-pay | Admitting: Student

## 2020-11-22 ENCOUNTER — Other Ambulatory Visit: Payer: Self-pay

## 2020-11-22 ENCOUNTER — Other Ambulatory Visit (HOSPITAL_COMMUNITY): Payer: Self-pay

## 2020-11-22 DIAGNOSIS — E1142 Type 2 diabetes mellitus with diabetic polyneuropathy: Secondary | ICD-10-CM

## 2020-11-22 DIAGNOSIS — Z794 Long term (current) use of insulin: Secondary | ICD-10-CM

## 2020-11-22 DIAGNOSIS — I10 Essential (primary) hypertension: Secondary | ICD-10-CM

## 2020-11-22 MED FILL — Insulin Detemir Soln Pen-injector 100 Unit/ML: SUBCUTANEOUS | 30 days supply | Qty: 30 | Fill #1 | Status: AC

## 2020-11-22 MED FILL — Olmesartan Medoxomil-Hydrochlorothiazide Tab 20-12.5 MG: ORAL | 90 days supply | Qty: 90 | Fill #1 | Status: AC

## 2020-11-26 ENCOUNTER — Other Ambulatory Visit (HOSPITAL_COMMUNITY): Payer: Self-pay

## 2020-11-27 ENCOUNTER — Other Ambulatory Visit (HOSPITAL_COMMUNITY): Payer: Self-pay

## 2020-11-28 ENCOUNTER — Other Ambulatory Visit (HOSPITAL_COMMUNITY): Payer: Self-pay

## 2020-11-29 ENCOUNTER — Other Ambulatory Visit (HOSPITAL_COMMUNITY): Payer: Self-pay

## 2020-11-29 ENCOUNTER — Other Ambulatory Visit: Payer: Self-pay | Admitting: Student

## 2020-11-29 DIAGNOSIS — I10 Essential (primary) hypertension: Secondary | ICD-10-CM

## 2020-11-29 DIAGNOSIS — E1142 Type 2 diabetes mellitus with diabetic polyneuropathy: Secondary | ICD-10-CM

## 2020-11-30 ENCOUNTER — Other Ambulatory Visit (HOSPITAL_COMMUNITY): Payer: Self-pay

## 2020-12-01 MED ORDER — INVOKAMET XR 150-1000 MG PO TB24
1.0000 | ORAL_TABLET | Freq: Two times a day (BID) | ORAL | 3 refills | Status: DC
  Filled 2020-12-01 – 2020-12-03 (×2): qty 180, 90d supply, fill #0

## 2020-12-01 MED ORDER — METOPROLOL SUCCINATE ER 100 MG PO TB24
ORAL_TABLET | ORAL | 3 refills | Status: DC
  Filled 2020-12-01: qty 90, 90d supply, fill #0
  Filled 2021-03-03: qty 90, 90d supply, fill #1
  Filled 2021-05-26: qty 90, 90d supply, fill #2
  Filled 2021-08-31: qty 90, 90d supply, fill #3

## 2020-12-02 ENCOUNTER — Other Ambulatory Visit (HOSPITAL_COMMUNITY): Payer: Self-pay

## 2020-12-03 ENCOUNTER — Other Ambulatory Visit (HOSPITAL_COMMUNITY): Payer: Self-pay

## 2020-12-04 ENCOUNTER — Other Ambulatory Visit (HOSPITAL_COMMUNITY): Payer: Self-pay

## 2020-12-05 ENCOUNTER — Telehealth: Payer: Self-pay

## 2020-12-05 NOTE — Telephone Encounter (Signed)
PA  came through for pt Canagliflozin-metFORMIN HCl ER (INVOKAMET XR) [phone] MG TB24  was done on cover my meds and awaiting approval or denial

## 2020-12-09 ENCOUNTER — Encounter: Payer: No Typology Code available for payment source | Admitting: Internal Medicine

## 2020-12-09 NOTE — Telephone Encounter (Signed)
A decision has been made in regards to pt INVOKAMET  it was denied for the following reason :  pt needed to have documents backing that he has tried and failed one of the following medications .Marland Kitchen Kirk Ruths , Syndardy XR , Farxiga or [place] ...    And this was even with me sending in office notes to show pt has been taking the medication ... the reason she the PA request from the pharmacy was the PT stated he can no longer afford to pay to the co -pay for the medication in question which was over a $100

## 2020-12-13 ENCOUNTER — Other Ambulatory Visit (HOSPITAL_COMMUNITY): Payer: Self-pay

## 2020-12-13 MED ORDER — SYNJARDY XR 12.5-1000 MG PO TB24
2.0000 | ORAL_TABLET | Freq: Every day | ORAL | 2 refills | Status: DC
  Filled 2020-12-13: qty 180, 90d supply, fill #0
  Filled 2021-03-26: qty 180, 90d supply, fill #1
  Filled 2021-05-21 – 2021-06-26 (×3): qty 180, 90d supply, fill #2

## 2020-12-13 NOTE — Telephone Encounter (Signed)
Invokamet not covered. Stop Invokamet, start Synjardy XR. Patient updated on change.

## 2020-12-16 ENCOUNTER — Other Ambulatory Visit (HOSPITAL_COMMUNITY): Payer: Self-pay

## 2020-12-16 ENCOUNTER — Other Ambulatory Visit: Payer: Self-pay | Admitting: Internal Medicine

## 2020-12-16 MED FILL — Continuous Glucose System Sensor: 28 days supply | Qty: 2 | Fill #2 | Status: AC

## 2020-12-17 ENCOUNTER — Other Ambulatory Visit (HOSPITAL_COMMUNITY): Payer: Self-pay

## 2020-12-17 MED ORDER — ALPRAZOLAM 0.5 MG PO TABS
ORAL_TABLET | Freq: Every day | ORAL | 0 refills | Status: DC | PRN
  Filled 2020-12-17: qty 30, 30d supply, fill #0

## 2020-12-30 ENCOUNTER — Ambulatory Visit (AMBULATORY_SURGERY_CENTER): Payer: No Typology Code available for payment source

## 2020-12-30 ENCOUNTER — Other Ambulatory Visit (HOSPITAL_COMMUNITY): Payer: Self-pay

## 2020-12-30 VITALS — Ht 66.0 in | Wt 222.0 lb

## 2020-12-30 DIAGNOSIS — Z1211 Encounter for screening for malignant neoplasm of colon: Secondary | ICD-10-CM

## 2020-12-30 DIAGNOSIS — T7840XA Allergy, unspecified, initial encounter: Secondary | ICD-10-CM

## 2020-12-30 DIAGNOSIS — F419 Anxiety disorder, unspecified: Secondary | ICD-10-CM

## 2020-12-30 HISTORY — DX: Allergy, unspecified, initial encounter: T78.40XA

## 2020-12-30 HISTORY — DX: Anxiety disorder, unspecified: F41.9

## 2020-12-30 MED ORDER — NA SULFATE-K SULFATE-MG SULF 17.5-3.13-1.6 GM/177ML PO SOLN
1.0000 | Freq: Once | ORAL | 0 refills | Status: AC
  Filled 2020-12-30: qty 354, 1d supply, fill #0

## 2020-12-30 NOTE — Progress Notes (Signed)
No egg or soy allergy known to patient  No issues known to pt with past sedation with any surgeries or procedures Patient denies ever being told they had issues or difficulty with intubation  No FH of Malignant Hyperthermia Pt is not on diet pills Pt is not on  home 02  Pt is not on blood thinners  Pt denies issues with constipation  No A fib or A flutter  Pt is fully vaccinated  for Covid   Virtual previsit.    NO PA's for preps discussed with pt In PV today  Discussed with pt there will be an out-of-pocket cost for prep and that varies from $0 to 70 +  dollars - pt verbalized understanding   Due to the COVID-19 pandemic we are asking patients to follow certain guidelines in [place] and the [facility]   Pt aware of COVID protocols and [facility] guidelines   

## 2021-01-01 ENCOUNTER — Encounter: Payer: Self-pay | Admitting: Internal Medicine

## 2021-01-02 MED FILL — Insulin Detemir Soln Pen-injector 100 Unit/ML: SUBCUTANEOUS | 30 days supply | Qty: 30 | Fill #2 | Status: AC

## 2021-01-02 MED FILL — Atorvastatin Calcium Tab 20 MG (Base Equivalent): ORAL | 90 days supply | Qty: 90 | Fill #2 | Status: AC

## 2021-01-02 MED FILL — Omeprazole Cap Delayed Release 20 MG: ORAL | 90 days supply | Qty: 90 | Fill #2 | Status: AC

## 2021-01-03 ENCOUNTER — Other Ambulatory Visit (HOSPITAL_COMMUNITY): Payer: Self-pay

## 2021-01-06 NOTE — Progress Notes (Signed)
Internal Medicine Clinic Attending  I evaluated the patient.  I personally confirmed the key portions of the history documented by Dr. Ruben Im and I reviewed pertinent patient test results.  The assessment, diagnosis, and plan were formulated together and I agree with the documentation in the resident's note.

## 2021-01-12 ENCOUNTER — Encounter: Payer: Self-pay | Admitting: Certified Registered Nurse Anesthetist

## 2021-01-13 ENCOUNTER — Encounter: Payer: Self-pay | Admitting: Internal Medicine

## 2021-01-13 ENCOUNTER — Ambulatory Visit (AMBULATORY_SURGERY_CENTER): Payer: No Typology Code available for payment source | Admitting: Internal Medicine

## 2021-01-13 ENCOUNTER — Other Ambulatory Visit: Payer: Self-pay

## 2021-01-13 VITALS — BP 106/45 | HR 64 | Temp 96.6°F | Resp 16 | Ht 66.0 in | Wt 222.0 lb

## 2021-01-13 DIAGNOSIS — Z1211 Encounter for screening for malignant neoplasm of colon: Secondary | ICD-10-CM

## 2021-01-13 HISTORY — PX: COLONOSCOPY: SHX174

## 2021-01-13 MED ORDER — SODIUM CHLORIDE 0.9 % IV SOLN: 500.0000 mL | Freq: Once | INTRAVENOUS | Status: DC

## 2021-01-13 NOTE — Op Note (Signed)
[facility] Patient Name: Kelly Gallagher Procedure Date: 01/13/2021 3:15 PM MRN: [id number] Endoscopist: Beverley Fiedler , MD Age: 50 Referring MD:  Date of Birth: [birth year] Gender: Female Account #: [id number] Procedure:                Colonoscopy Indications:              Screening for colorectal malignant neoplasm, This                            is the patient's first colonoscopy Medicines:                Monitored Anesthesia Care Procedure:                Pre-Anesthesia Assessment:                           - Prior to the procedure, a History and Physical                            was performed, and patient medications and                            allergies were reviewed. The patient's tolerance of                            previous anesthesia was also reviewed. The risks                            and benefits of the procedure and the sedation                            options and risks were discussed with the patient.                            All questions were answered, and informed consent                            was obtained. Prior Anticoagulants: The patient has                            taken no previous anticoagulant or antiplatelet                            agents. ASA Grade Assessment: II - A patient with                            mild systemic disease. After reviewing the risks                            and benefits, the patient was deemed in                            satisfactory condition to undergo the procedure.  After obtaining informed consent, the colonoscope                            was passed under direct vision. Throughout the                            procedure, the patient's blood pressure, pulse, and                            oxygen saturations were monitored continuously. The                            CF [id number] #[id number] was introduced through the anus                            and advanced to the cecum,  identified by                            appendiceal orifice and ileocecal valve. The                            colonoscopy was performed without difficulty. The                            patient tolerated the procedure well. The quality                            of the bowel preparation was good. The ileocecal                            valve, appendiceal orifice, and rectum were                            photographed. Scope In: 3:20:36 PM Scope Out: 3:38:05 PM Scope Withdrawal Time: 0 hours 12 minutes 53 seconds  Total Procedure Duration: 0 hours 17 minutes 29 seconds  Findings:                 The digital rectal exam was normal.                           The colon (entire examined portion) appeared normal.                           Internal hemorrhoids, hypertrophied anal papillae                            and small perianal skin tags were found during                            retroflexion. The hemorrhoids were small. Complications:            No immediate complications. Estimated Blood Loss:     Estimated blood loss: none. Impression:               - The entire examined  colon is normal.                           - Internal hemorrhoids with hypertrophied anal                            papillae and small skin tags.                           - No specimens collected. Recommendation:           - Patient has a contact number available for                            emergencies. The signs and symptoms of potential                            delayed complications were discussed with the                            patient. Return to normal activities tomorrow.                            Written discharge instructions were provided to the                            patient.                           - Resume previous diet.                           - Continue present medications.                           - Repeat colonoscopy in 10 years for screening                             purposes. Beverley Fiedler, MD 01/13/2021 3:43:29 PM This report has been signed electronically.

## 2021-01-13 NOTE — Patient Instructions (Signed)
Handout given on hemorrhoids.  YOU HAD AN ENDOSCOPIC PROCEDURE TODAY AT THE [facility]:   Refer to the procedure report that was given to you for any specific questions about what was found during the examination.  If the procedure report does not answer your questions, please call your gastroenterologist to clarify.  If you requested that your care partner not be given the details of your procedure findings, then the procedure report has been included in a sealed envelope for you to review at your convenience later.  YOU SHOULD EXPECT: Some feelings of bloating in the abdomen. Passage of more gas than usual.  Walking can help get rid of the air that was put into your GI tract during the procedure and reduce the bloating. If you had a lower endoscopy (such as a colonoscopy or flexible sigmoidoscopy) you may notice spotting of blood in your stool or on the toilet paper. If you underwent a bowel prep for your procedure, you may not have a normal bowel movement for a few days.  Please Note:  You might notice some irritation and congestion in your nose or some drainage.  This is from the oxygen used during your procedure.  There is no need for concern and it should clear up in a day or so.  SYMPTOMS TO REPORT IMMEDIATELY:  Following lower endoscopy (colonoscopy or flexible sigmoidoscopy):  Excessive amounts of blood in the stool  Significant tenderness or worsening of abdominal pains  Swelling of the abdomen that is new, acute  Fever of 100F or higher   For urgent or emergent issues, a gastroenterologist can be reached at any hour by calling [phone]. Do not use MyChart messaging for urgent concerns.    DIET:  We do recommend a small meal at first, but then you may proceed to your regular diet.  Drink plenty of fluids but you should avoid alcoholic beverages for 24 hours.  ACTIVITY:  You should plan to take it easy for the rest of today and you should NOT DRIVE or use heavy  machinery until tomorrow (because of the sedation medicines used during the test).    FOLLOW UP: Our staff will call the number listed on your records 48-72 hours following your procedure to check on you and address any questions or concerns that you may have regarding the information given to you following your procedure. If we do not reach you, we will leave a message.  We will attempt to reach you two times.  During this call, we will ask if you have developed any symptoms of COVID 19. If you develop any symptoms (ie: fever, flu-like symptoms, shortness of breath, cough etc.) before then, please call [phone].  If you test positive for Covid 19 in the 2 weeks post procedure, please call and report this information to us.    If any biopsies were taken you will be contacted by phone or by letter within the next 1-3 weeks.  Please call us at [phone] if you have not heard about the biopsies in 3 weeks.    SIGNATURES/CONFIDENTIALITY: You and/or your care partner have signed paperwork which will be entered into your electronic medical record.  These signatures attest to the fact that that the information above on your After Visit Summary has been reviewed and is understood.  Full responsibility of the confidentiality of this discharge information lies with you and/or your care-partner.  

## 2021-01-13 NOTE — Progress Notes (Signed)
Report given to PACU, vss 

## 2021-01-13 NOTE — Progress Notes (Signed)
GASTROENTEROLOGY PROCEDURE H&P NOTE   Primary Care Physician: Albertha Ghee, MD    Reason for Procedure:  Colorectal cancer screening  Plan:    Colonoscopy  Patient is appropriate for endoscopic procedure(s) in the ambulatory ([facility]) setting.  The nature of the procedure, as well as the risks, benefits, and alternatives were carefully and thoroughly reviewed with the patient. Ample time for discussion and questions allowed. The patient understood, was satisfied, and agreed to proceed.     HPI: Kelly Gallagher is a 50 y.o. female who presents for screening colonoscopy.  Medical history as below.  Tolerated the prep.  No specific complaints today including no chest pain, shortness of breath or abdominal pain.  Past Medical History:  Diagnosis Date   Allergy 12/30/2020   seasonal   Anxiety 12/30/2020   Diabetes mellitus without complication (HCC)    GERD (gastroesophageal reflux disease)    Hypertension     Past Surgical History:  Procedure Laterality Date   CHOLECYSTECTOMY     COLONOSCOPY  01/13/2021   JMP   DILATION AND CURETTAGE OF UTERUS     KNEE ARTHROSCOPY W/ MENISCAL REPAIR Right 05/2010    Prior to Admission medications   Medication Sig Start Date End Date Taking? Authorizing Provider  atorvastatin (LIPITOR) 20 MG tablet TAKE 1 TABLET BY MOUTH ONCE A DAY 04/02/20 04/03/21 Yes Bloomfield, Carley D, DO  betamethasone dipropionate 0.05 % cream APPLY TO THE AFFECTED AREA(S) TWO TIMES DAILY 07/30/20 07/30/21 Yes Remo Lipps, MD  Cholecalciferol (VITAMIN D3) 10 MCG (400 UNIT) tablet daily. 05/05/19  Yes [provider]  Continuous Blood Gluc Receiver (FREESTYLE LIBRE 2 READER SYSTM) DEVI 1 each by Does not apply route 4 (four) times daily. 09/14/19  Yes Reymundo Poll, MD  Continuous Blood Gluc Sensor (FREESTYLE LIBRE 2 SENSOR) MISC USE TO CHECK BLOOD SUGAR FOUR TIMES DAILY. CHANGE SENSOR EVERY 14 DAYS AS DIRECTED 04/25/20 04/25/21 Yes Remo Lipps, MD  DULoxetine  (CYMBALTA) 60 MG capsule Take 1 capsule by mouth daily. 09/25/20 09/25/21 Yes Verdene Lennert, MD  Empagliflozin-metFORMIN HCl ER (SYNJARDY XR) 12.07-998 MG TB24 Take 2 tablets by mouth daily. 12/13/20  Yes Albertha Ghee, MD  insulin detemir (LEVEMIR FLEXTOUCH) 100 UNIT/ML FlexPen INJECT 60 UNITS EVERY MORNING AND INJECT 40 UNITS EVERY EVENING 10/24/20 10/24/21 Yes Albertha Ghee, MD  Insulin Pen Needle (UNIFINE PENTIPS) 32G X 4 MM MISC USE TO INJECT INSULIN INTO THE SKIN 2 TIMES DAILY 07/30/20 07/30/21 Yes Remo Lipps, MD  levonorgestrel (MIRENA, 52 MG,) 20 MCG/DAY IUD  05/05/19  Yes [provider]  loratadine (CLARITIN) 10 MG tablet Take 10 mg by mouth daily.   Yes [provider]  metoprolol succinate (TOPROL-XL) 100 MG 24 hr tablet TAKE 1 TABLET BY MOUTH DAILY WITH OR IMMEDIATELY FOLLOWING A MEAL 12/01/20 12/01/21 Yes Albertha Ghee, MD  Multiple Vitamin (MULTIVITAMIN) tablet Take 1 tablet by mouth daily.   Yes [provider]  olmesartan-hydrochlorothiazide (BENICAR HCT) 20-12.5 MG tablet TAKE 1 TABLET BY MOUTH ONCE A DAY 01/25/20 02/22/21 Yes Remo Lipps, MD  Omega-3 Fatty Acids (FISH OIL) 1000 MG CAPS Take by mouth.   Yes [provider]  omeprazole (PRILOSEC) 20 MG capsule TAKE 1 CAPSULE BY MOUTH ONCE A DAY 04/02/20 04/03/21 Yes Bloomfield, Carley D, DO  Probiotic Product (PROBIOTIC-10 PO) Take by mouth.   Yes [provider]  vitamin B-12 (CYANOCOBALAMIN) 1000 MCG tablet Take by mouth.   Yes [provider]  Albuterol Sulfate 108 (  90 Base) MCG/ACT AEPB Inhale 2 puffs into the lungs every 4 (four) hours as needed. 04/28/18   Burns Spain, MD  ALPRAZolam Prudy Feeler) 0.5 MG tablet Take 1 tablet by mouth daily as needed for anxiety. 12/17/20 06/15/21  Albertha Ghee, MD  Semaglutide, 2 MG/DOSE, (OZEMPIC, 2 MG/DOSE,) 8 MG/3ML SOPN Inject 2 mg into the skin once a week. 10/23/20   Albertha Ghee, MD  Insulin Glargine [facility]) 100 UNIT/ML Inject 60 Units  into the skin daily AND 40 Units at bedtime. 05/06/20 06/07/20  Remo Lipps, MD    Current Outpatient Medications  Medication Sig Dispense Refill   atorvastatin (LIPITOR) 20 MG tablet TAKE 1 TABLET BY MOUTH ONCE A DAY 90 tablet 3   betamethasone dipropionate 0.05 % cream APPLY TO THE AFFECTED AREA(S) TWO TIMES DAILY 30 g 2   Cholecalciferol (VITAMIN D3) 10 MCG (400 UNIT) tablet daily.     Continuous Blood Gluc Receiver (FREESTYLE LIBRE 2 READER SYSTM) DEVI 1 each by Does not apply route 4 (four) times daily. 1 each 0   Continuous Blood Gluc Sensor (FREESTYLE LIBRE 2 SENSOR) MISC USE TO CHECK BLOOD SUGAR FOUR TIMES DAILY. CHANGE SENSOR EVERY 14 DAYS AS DIRECTED 2 each 11   DULoxetine (CYMBALTA) 60 MG capsule Take 1 capsule by mouth daily. 30 capsule 5   Empagliflozin-metFORMIN HCl ER (SYNJARDY XR) 12.07-998 MG TB24 Take 2 tablets by mouth daily. 180 tablet 2   insulin detemir (LEVEMIR FLEXTOUCH) 100 UNIT/ML FlexPen INJECT 60 UNITS EVERY MORNING AND INJECT 40 UNITS EVERY EVENING 15 mL 5   Insulin Pen Needle (UNIFINE PENTIPS) 32G X 4 MM MISC USE TO INJECT INSULIN INTO THE SKIN 2 TIMES DAILY 200 each 11   levonorgestrel (MIRENA, 52 MG,) 20 MCG/DAY IUD      loratadine (CLARITIN) 10 MG tablet Take 10 mg by mouth daily.     metoprolol succinate (TOPROL-XL) 100 MG 24 hr tablet TAKE 1 TABLET BY MOUTH DAILY WITH OR IMMEDIATELY FOLLOWING A MEAL 90 tablet 3   Multiple Vitamin (MULTIVITAMIN) tablet Take 1 tablet by mouth daily.     olmesartan-hydrochlorothiazide (BENICAR HCT) 20-12.5 MG tablet TAKE 1 TABLET BY MOUTH ONCE A DAY 180 tablet 3   Omega-3 Fatty Acids (FISH OIL) 1000 MG CAPS Take by mouth.     omeprazole (PRILOSEC) 20 MG capsule TAKE 1 CAPSULE BY MOUTH ONCE A DAY 90 capsule 3   Probiotic Product (PROBIOTIC-10 PO) Take by mouth.     vitamin B-12 (CYANOCOBALAMIN) 1000 MCG tablet Take by mouth.     Albuterol Sulfate 108 (90 Base) MCG/ACT AEPB Inhale 2 puffs into the lungs every 4 (four) hours as  needed. 1 each 4   ALPRAZolam (XANAX) 0.5 MG tablet Take 1 tablet by mouth daily as needed for anxiety. 30 tablet 0   Semaglutide, 2 MG/DOSE, (OZEMPIC, 2 MG/DOSE,) 8 MG/3ML SOPN Inject 2 mg into the skin once a week. 3 mL 2   Current Facility-Administered Medications  Medication Dose Route Frequency Provider Last Rate Last Admin   0.9 %  sodium chloride infusion  500 mL Intravenous Once Shukri Nistler, Carie Caddy, MD        Allergies as of 01/13/2021 - Review Complete 01/13/2021  Allergen Reaction Noted   Ace inhibitors Anaphylaxis 03/18/2015   Effexor xr [venlafaxine hcl]  04/28/2018    Family History  Problem Relation Age of Onset   Colon polyps Father    Cancer Father        Head and  neck cancer. Has PEG   Lupus Sister    Breast cancer Other    Breast cancer Other    Colon cancer Neg Hx    Esophageal cancer Neg Hx    Rectal cancer Neg Hx    Stomach cancer Neg Hx     Social History   Socioeconomic History   Marital status: Married    Spouse name: Not on file   Number of children: Not on file   Years of education: Not on file   Highest education level: Not on file  Occupational History   Not on file  Tobacco Use   Smoking status: Never   Smokeless tobacco: Never  Vaping Use   Vaping Use: Never used  Substance and Sexual Activity   Alcohol use: Not Currently   Drug use: Never   Sexual activity: Yes    Birth control/protection: I.U.D.  Other Topics Concern   Not on file  Social History Narrative   Not on file   Social Determinants of Health   Financial Resource Strain: Not on file  Food Insecurity: Not on file  Transportation Needs: Not on file  Physical Activity: Not on file  Stress: Not on file  Social Connections: Not on file  Intimate Partner Violence: Not on file    Physical Exam: Vital signs in last 24 hours: @BP  (!) 94/54   Pulse 76   Temp (!) 96.6 F (35.9 C) (Temporal)   Ht 5\' 6"  (1.676 m)   Wt 222 lb (100.7 kg)   SpO2 96%   BMI 35.83 kg/m  GEN:  NAD EYE: Sclerae anicteric ENT: MMM CV: Non-tachycardic Pulm: CTA b/l GI: Soft, NT/ND NEURO:  Alert & Oriented x 3   , MD [facility]  01/13/2021 3:06 PM

## 2021-01-13 NOTE — Progress Notes (Signed)
Pt's states no medical or surgical changes since previsit or office visit.  ° °VS DT °

## 2021-01-14 ENCOUNTER — Encounter: Payer: Self-pay | Admitting: Internal Medicine

## 2021-01-15 ENCOUNTER — Telehealth: Payer: Self-pay

## 2021-01-15 NOTE — Telephone Encounter (Signed)
  Follow up Call-  Call back number 01/13/2021  Post procedure Call Back phone  # [phone]  Permission to leave phone message Yes  Some recent data might be hidden     Patient questions:  Do you have a fever, pain , or abdominal swelling? No. Pain Score  0 *  Have you tolerated food without any problems? Yes.    Have you been able to return to your normal activities? Yes.    Do you have any questions about your discharge instructions: Diet   No. Medications  No. Follow up visit  No.  Do you have questions or concerns about your Care? No.  Actions: * If pain score is 4 or above: No action needed, pain <4.

## 2021-01-30 ENCOUNTER — Other Ambulatory Visit (HOSPITAL_COMMUNITY): Payer: Self-pay

## 2021-01-30 MED FILL — Continuous Glucose System Sensor: 28 days supply | Qty: 2 | Fill #3 | Status: AC

## 2021-02-03 ENCOUNTER — Other Ambulatory Visit (HOSPITAL_COMMUNITY): Payer: Self-pay

## 2021-02-10 ENCOUNTER — Other Ambulatory Visit: Payer: Self-pay

## 2021-02-10 ENCOUNTER — Other Ambulatory Visit (HOSPITAL_COMMUNITY): Payer: Self-pay

## 2021-02-10 ENCOUNTER — Other Ambulatory Visit: Payer: Self-pay | Admitting: Student

## 2021-02-10 DIAGNOSIS — I10 Essential (primary) hypertension: Secondary | ICD-10-CM

## 2021-02-11 ENCOUNTER — Other Ambulatory Visit: Payer: Self-pay | Admitting: Student

## 2021-02-11 ENCOUNTER — Other Ambulatory Visit (HOSPITAL_COMMUNITY): Payer: Self-pay

## 2021-02-11 MED ORDER — OLMESARTAN MEDOXOMIL-HCTZ 20-12.5 MG PO TABS
1.0000 | ORAL_TABLET | Freq: Every day | ORAL | 3 refills | Status: DC
Start: 2021-02-11 — End: 2022-03-02
  Filled 2021-02-11: qty 60, 60d supply, fill #0
  Filled 2021-04-22: qty 60, 60d supply, fill #1
  Filled 2021-06-25: qty 60, 60d supply, fill #2
  Filled 2021-08-31: qty 60, 60d supply, fill #3
  Filled 2021-10-29: qty 60, 60d supply, fill #4
  Filled 2021-12-26: qty 60, 60d supply, fill #5

## 2021-02-11 NOTE — Telephone Encounter (Signed)
Last in person visit on 09/05/20 With a telehealth visit on 10/18/20 No future appts scheduled  Refill request sent to pcp for review And front office to assist with scheduling

## 2021-02-12 ENCOUNTER — Other Ambulatory Visit (HOSPITAL_COMMUNITY): Payer: Self-pay

## 2021-02-12 MED ORDER — LEVEMIR FLEXTOUCH 100 UNIT/ML ~~LOC~~ SOPN
PEN_INJECTOR | SUBCUTANEOUS | 5 refills | Status: DC
  Filled 2021-02-12 – 2021-02-25 (×2): qty 30, 30d supply, fill #0
  Filled 2021-03-26: qty 30, 30d supply, fill #1
  Filled 2021-04-22: qty 30, 30d supply, fill #2

## 2021-02-20 ENCOUNTER — Other Ambulatory Visit (HOSPITAL_COMMUNITY): Payer: Self-pay

## 2021-02-25 ENCOUNTER — Other Ambulatory Visit: Payer: Self-pay | Admitting: Internal Medicine

## 2021-02-25 ENCOUNTER — Other Ambulatory Visit (HOSPITAL_COMMUNITY): Payer: Self-pay

## 2021-02-25 DIAGNOSIS — R635 Abnormal weight gain: Secondary | ICD-10-CM

## 2021-02-25 DIAGNOSIS — E1142 Type 2 diabetes mellitus with diabetic polyneuropathy: Secondary | ICD-10-CM

## 2021-02-25 DIAGNOSIS — Z794 Long term (current) use of insulin: Secondary | ICD-10-CM

## 2021-02-26 ENCOUNTER — Other Ambulatory Visit (HOSPITAL_COMMUNITY): Payer: Self-pay

## 2021-02-26 MED ORDER — OZEMPIC (2 MG/DOSE) 8 MG/3ML ~~LOC~~ SOPN
2.0000 mg | PEN_INJECTOR | SUBCUTANEOUS | 2 refills | Status: DC
  Filled 2021-02-26: qty 3, 28d supply, fill #0
  Filled 2021-03-26: qty 3, 28d supply, fill #1
  Filled 2021-04-22: qty 3, 28d supply, fill #2

## 2021-03-03 ENCOUNTER — Other Ambulatory Visit (HOSPITAL_COMMUNITY): Payer: Self-pay

## 2021-03-03 ENCOUNTER — Other Ambulatory Visit: Payer: Self-pay | Admitting: Internal Medicine

## 2021-03-03 MED FILL — Continuous Glucose System Sensor: 28 days supply | Qty: 2 | Fill #4 | Status: CN

## 2021-03-04 ENCOUNTER — Other Ambulatory Visit (HOSPITAL_COMMUNITY): Payer: Self-pay

## 2021-03-05 MED ORDER — ALPRAZOLAM 0.5 MG PO TABS
ORAL_TABLET | Freq: Every day | ORAL | 0 refills | Status: AC | PRN
  Filled 2021-03-05: qty 30, 30d supply, fill #0

## 2021-03-06 ENCOUNTER — Other Ambulatory Visit (HOSPITAL_COMMUNITY): Payer: Self-pay

## 2021-03-12 ENCOUNTER — Other Ambulatory Visit (HOSPITAL_COMMUNITY): Payer: Self-pay

## 2021-03-26 ENCOUNTER — Other Ambulatory Visit: Payer: Self-pay

## 2021-03-26 ENCOUNTER — Other Ambulatory Visit (HOSPITAL_COMMUNITY): Payer: Self-pay

## 2021-03-26 ENCOUNTER — Other Ambulatory Visit: Payer: Self-pay | Admitting: Internal Medicine

## 2021-03-26 MED ORDER — DULOXETINE HCL 60 MG PO CPEP
ORAL_CAPSULE | Freq: Every day | ORAL | 5 refills | Status: DC
  Filled 2021-03-26: qty 30, 30d supply, fill #0
  Filled 2021-04-22: qty 30, 30d supply, fill #1
  Filled 2021-05-26: qty 30, 30d supply, fill #2
  Filled 2021-06-25: qty 30, 30d supply, fill #3
  Filled 2021-07-18: qty 30, 30d supply, fill #4
  Filled 2021-08-31: qty 30, 30d supply, fill #5

## 2021-03-26 MED FILL — Continuous Glucose System Sensor: 28 days supply | Qty: 2 | Fill #4 | Status: AC

## 2021-03-26 NOTE — Telephone Encounter (Signed)
Next appt scheduled 04/02/21 with Dr Ephriam Knuckles.

## 2021-03-27 ENCOUNTER — Other Ambulatory Visit (HOSPITAL_COMMUNITY): Payer: Self-pay

## 2021-03-29 ENCOUNTER — Other Ambulatory Visit (HOSPITAL_COMMUNITY): Payer: Self-pay

## 2021-03-30 NOTE — Progress Notes (Signed)
° °  Office Visit   Patient ID: Kelly Gallagher, female    DOB: 04/06/1970, 51 y.o.   MRN: [id number]   PCP: Madalyn Rob, MD   Subjective:   Kelly Gallagher is a 51 y.o. year old female with hypertension, hyperlipidemia, type 2 diabetes, morbid obesity and GAD who presents for follow up of her chronic medical conditions. Please refer to problem based charting for assessment and plan.  Objective:   BP 111/69 (BP Location: Right Arm, Patient Position: Sitting, Cuff Size: Normal)    Pulse 69    Wt 220 lb 3.2 oz (99.9 kg)    SpO2 100%    BMI 35.54 kg/m   General: well appearing, no distress Cardiac: RRR Pulm: lungs clear Psych: normal mood and affect Assessment & Plan:   Problem List Items Addressed This Visit       Cardiovascular and Mediastinum   Hypertension associated with diabetes ([place]) - Primary (Chronic)    Current medications: olmesartan-hctz 20-12.5mg , metoprolol succinate 100mg  Blood pressure is at goal in the office today--111/69. Denies adverse medication effects. BMP unremarkable Continue current management Repeat BMP in 6-12 months.       Relevant Orders   Basic metabolic panel (Completed)     Endocrine   DM (diabetes mellitus) type II controlled, 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HCC) (Chronic)    Current medications: Synjardy xr 25-1000mg  daily, levemir 60U in the morning and 40U at night, Ozempic 2mg  weekly Denies adverse medication effects. No hypoglycemic events. Reports adherence to current therapy, congruent with dispense report. Seems to have good health literacy and understanding  A1C has increased since May from 8.0>8.5. We downloaded her freestyle report but unfortunately, it has limited data due to her having recently changed the pump. From what data is available however, she is in target range 41% of time and high (181-250) 59% of time. Her highs are most prominent in the morning prior to eating. Will increase PM lantus dose to 45U, continue 60U in the  morning Continue synjardy and ozempic I will review her meter next week to help guide further adjustments Due for ophthalmology exam Foot exam up to date--due in June 2023       Relevant Orders   POC Hbg A1C (Completed)   Hyperlipidemia associated with type 2 diabetes mellitus (HCC) (Chronic)    Check lipid panel today.      Relevant Orders   Lipid panel (Completed)     Other   Generalized anxiety disorder (Chronic)    Current therapy: cymbalta 60mg  daily, xanax 0.5mg  daily prn for panic attacks Denies adverse medication effects. Medications do not interfer with ADLs and improve her quality of life.  Medication contract signed at today's visit UDS obtained Follow up with Dr. Court Joy in 3 months      Relevant Orders   ToxAssure Select,+Antidepr,UR   Other Visit Diagnoses     Need for vaccination against Streptococcus pneumoniae       Relevant Orders   Pneumococcal conjugate vaccine 20-valent (Prevnar 20) (Completed)      Return in about 4 weeks (around 04/30/2021).   Pt discussed with Dr. Bretta Bang, MD Internal Medicine Resident PGY-3 [facility] 04/03/2021 12:17 PM

## 2021-04-02 ENCOUNTER — Other Ambulatory Visit (HOSPITAL_COMMUNITY): Payer: Self-pay

## 2021-04-02 ENCOUNTER — Encounter: Payer: Self-pay | Admitting: Internal Medicine

## 2021-04-02 ENCOUNTER — Ambulatory Visit (INDEPENDENT_AMBULATORY_CARE_PROVIDER_SITE_OTHER): Payer: No Typology Code available for payment source | Admitting: Internal Medicine

## 2021-04-02 VITALS — BP 111/69 | HR 69 | Wt 220.2 lb

## 2021-04-02 DIAGNOSIS — F411 Generalized anxiety disorder: Secondary | ICD-10-CM | POA: Diagnosis not present

## 2021-04-02 DIAGNOSIS — E785 Hyperlipidemia, unspecified: Secondary | ICD-10-CM

## 2021-04-02 DIAGNOSIS — E1159 Type 2 diabetes mellitus with other circulatory complications: Secondary | ICD-10-CM

## 2021-04-02 DIAGNOSIS — Z794 Long term (current) use of insulin: Secondary | ICD-10-CM | POA: Diagnosis not present

## 2021-04-02 DIAGNOSIS — E1169 Type 2 diabetes mellitus with other specified complication: Secondary | ICD-10-CM

## 2021-04-02 DIAGNOSIS — Z23 Encounter for immunization: Secondary | ICD-10-CM | POA: Diagnosis not present

## 2021-04-02 DIAGNOSIS — I152 Hypertension secondary to endocrine disorders: Secondary | ICD-10-CM | POA: Diagnosis not present

## 2021-04-02 DIAGNOSIS — E1142 Type 2 diabetes mellitus with diabetic polyneuropathy: Secondary | ICD-10-CM

## 2021-04-02 LAB — POCT GLYCOSYLATED HEMOGLOBIN (HGB A1C): Hemoglobin A1C: 8.5 % — AB (ref 4.0–5.6)

## 2021-04-02 LAB — GLUCOSE, CAPILLARY: Glucose-Capillary: 160 mg/dL — ABNORMAL HIGH (ref 70–99)

## 2021-04-03 ENCOUNTER — Encounter: Payer: Self-pay | Admitting: Internal Medicine

## 2021-04-03 LAB — LIPID PANEL
Chol/HDL Ratio: 2.6 ratio (ref 0.0–4.4)
Cholesterol, Total: 126 mg/dL (ref 100–199)
HDL: 48 mg/dL (ref >39)
LDL Chol Calc (NIH): 51 mg/dL (ref 0–99)
Triglycerides: 160 mg/dL — ABNORMAL HIGH (ref 0–149)
VLDL Cholesterol Cal: 27 mg/dL (ref 5–40)

## 2021-04-03 LAB — BASIC METABOLIC PANEL
BUN/Creatinine Ratio: 18 (ref 9–23)
BUN: 10 mg/dL (ref 6–24)
CO2: 22 mmol/L (ref 20–29)
Calcium: 9.6 mg/dL (ref 8.7–10.2)
Chloride: 100 mmol/L (ref 96–106)
Creatinine, Ser: 0.56 mg/dL — ABNORMAL LOW (ref 0.57–1.00)
Glucose: 146 mg/dL — ABNORMAL HIGH (ref 70–99)
Potassium: 4.3 mmol/L (ref 3.5–5.2)
Sodium: 139 mmol/L (ref 134–144)
eGFR: 113 mL/min/{1.73_m2} (ref >59)

## 2021-04-03 NOTE — Assessment & Plan Note (Signed)
Current medications: olmesartan-hctz 20-12.5mg , metoprolol succinate 100mg  Blood pressure is at goal in the office today--111/69. Denies adverse medication effects. BMP unremarkable  Continue current management  Repeat BMP in 6-12 months.

## 2021-04-03 NOTE — Assessment & Plan Note (Signed)
Check lipid panel today 

## 2021-04-03 NOTE — Assessment & Plan Note (Addendum)
Current medications: Synjardy xr 25-1000mg  daily, levemir 60U in the morning and 40U at night, Ozempic 2mg  weekly Denies adverse medication effects. No hypoglycemic events. Reports adherence to current therapy, congruent with dispense report. Seems to have good health literacy and understanding  A1C has increased since May from 8.0>8.5. We downloaded her freestyle report but unfortunately, it has limited data due to her having recently changed the pump. From what data is available however, she is in target range 41% of time and high (181-250) 59% of time. Her highs are most prominent in the morning prior to eating.  Will increase PM lantus dose to 45U, continue 60U in the morning  Continue synjardy and ozempic  I will review her meter next week to help guide further adjustments  Due for ophthalmology exam  Foot exam up to date--due in June 2023

## 2021-04-03 NOTE — Assessment & Plan Note (Signed)
Current therapy: cymbalta 60mg  daily, xanax 0.5mg  daily prn for panic attacks Denies adverse medication effects. Medications do not interfer with ADLs and improve her quality of life.   Medication contract signed at today's visit  UDS obtained  Follow up with Dr. in 3 months

## 2021-04-04 NOTE — Progress Notes (Signed)
Internal Medicine Clinic Attending  Case discussed with Dr. Christian  At the time of the visit.  We reviewed the resident's history and exam and pertinent patient test results.  I agree with the assessment, diagnosis, and plan of care documented in the resident's note.  

## 2021-04-05 ENCOUNTER — Other Ambulatory Visit (HOSPITAL_COMMUNITY): Payer: Self-pay

## 2021-04-05 ENCOUNTER — Other Ambulatory Visit: Payer: Self-pay | Admitting: Internal Medicine

## 2021-04-08 ENCOUNTER — Other Ambulatory Visit (HOSPITAL_COMMUNITY): Payer: Self-pay

## 2021-04-08 LAB — TOXASSURE SELECT,+ANTIDEPR,UR

## 2021-04-08 MED ORDER — OMEPRAZOLE 20 MG PO CPDR
DELAYED_RELEASE_CAPSULE | Freq: Every day | ORAL | 3 refills | Status: DC
  Filled 2021-04-08: qty 90, 90d supply, fill #0
  Filled 2021-07-05: qty 90, 90d supply, fill #1
  Filled 2021-10-02: qty 90, 90d supply, fill #2
  Filled 2021-12-31: qty 90, 90d supply, fill #3

## 2021-04-08 MED ORDER — ATORVASTATIN CALCIUM 20 MG PO TABS
ORAL_TABLET | Freq: Every day | ORAL | 3 refills | Status: DC
  Filled 2021-04-08: qty 90, 90d supply, fill #0
  Filled 2021-07-05: qty 90, 90d supply, fill #1
  Filled 2021-10-02: qty 90, 90d supply, fill #2
  Filled 2021-12-31: qty 90, 90d supply, fill #3

## 2021-04-09 ENCOUNTER — Encounter: Payer: Self-pay | Admitting: Internal Medicine

## 2021-04-09 ENCOUNTER — Other Ambulatory Visit: Payer: Self-pay | Admitting: Internal Medicine

## 2021-04-09 DIAGNOSIS — E1142 Type 2 diabetes mellitus with diabetic polyneuropathy: Secondary | ICD-10-CM

## 2021-04-09 NOTE — Assessment & Plan Note (Signed)
I reviewed her freestyle download today after one week of increasing evening levemir dose to 45U. Continues to have persistently elevated glucoses with no improvement since the dose change. Suspect that she is becoming extremely insulin resistant. I have placed referrals to [facility] for diabetic nutrition management and to [facility] for diabetic medication management.

## 2021-04-14 ENCOUNTER — Other Ambulatory Visit (HOSPITAL_COMMUNITY): Payer: Self-pay

## 2021-04-14 ENCOUNTER — Ambulatory Visit (INDEPENDENT_AMBULATORY_CARE_PROVIDER_SITE_OTHER): Payer: No Typology Code available for payment source | Admitting: Internal Medicine

## 2021-04-14 ENCOUNTER — Encounter: Payer: Self-pay | Admitting: Internal Medicine

## 2021-04-14 DIAGNOSIS — R112 Nausea with vomiting, unspecified: Secondary | ICD-10-CM

## 2021-04-14 DIAGNOSIS — R197 Diarrhea, unspecified: Secondary | ICD-10-CM

## 2021-04-14 DIAGNOSIS — R198 Other specified symptoms and signs involving the digestive system and abdomen: Secondary | ICD-10-CM

## 2021-04-14 MED ORDER — ONDANSETRON HCL 4 MG PO TABS
4.0000 mg | ORAL_TABLET | Freq: Three times a day (TID) | ORAL | 0 refills | Status: DC | PRN
  Filled 2021-04-14: qty 30, 10d supply, fill #0

## 2021-04-14 MED ORDER — CLOTRIMAZOLE 1 % EX CREA
1.0000 | TOPICAL_CREAM | Freq: Two times a day (BID) | CUTANEOUS | 0 refills | Status: DC
Start: 2021-04-14 — End: 2022-10-13
  Filled 2021-04-14: qty 28, 14d supply, fill #0

## 2021-04-15 DIAGNOSIS — R198 Other specified symptoms and signs involving the digestive system and abdomen: Secondary | ICD-10-CM | POA: Insufficient documentation

## 2021-04-15 NOTE — Progress Notes (Signed)
°  [facility] Internal Medicine Residency Telephone Encounter Continuity Care Appointment  HPI:  This telephone encounter was created for [name] on 04/15/2021 for acute on chronic nausea, vomitting and diarrhea. Please refer to problem based charting for details of HPI, assessment and plan.   Assessment / Plan / Recommendations:    GI symptoms    HPI: >12 month history of episodes of sudden onset nausea, vomiting and diarrhea associated with flushing. Symptoms can last 12-48 hours and resolve spontaneously although she does sometimes take some zofran which helps with the nausea. Onset does not correlate with eating. She thinks that symptoms may be more appreciable with large swings in her blood sugars but are not associated with hyper or hypoglycemia. No associated abdominal pain. No melena, hematochezia or hematemesis. No issues with constipation. Most recent episode occurred 1/22 and are still ongoing but slowly resolving. Frequency of episodes: ~1 every couple of months. She notes that she had her first hypoglycemic events this morning. Blood sugars have been remarkably improved though overall since increasing her long acting insulin. Assessment: etiology is unclear. She notes that someone had told her about a diabetic version of dumping syndrome. I did a brief literature review which does suggest some rare cases of autonomic dysfunction causing diarrhea, especially with significant glucose changes, however there does not seem to be a significant amount of information about it. I would still consider this to be a possibility. The large changes in her glucoses may be a trigger. I discussed alternative diagnoses including small bowel bacterial overgrowth and role of referral to GI. She does not feel that symptoms occur frequently enough for her to wish to pursue further workup at this time, however acknowledges that, if symptoms become more frequent, then she may change her mind.  Plan zofran  sent to pharmacy She is agreeable to keeping a food diary and to bring it to her appointments for review She wishes to continue to monitor symptoms for now and see how they evolve after glucoses stabilize. If frequency of episodes increase, consider referral to GI She will let us know if she starts to have increased number of hypoglycemic episodes       As always, pt is advised that if symptoms worsen or new symptoms arise, they should go to an urgent care facility or to to ER for further evaluation.   Consent and Medical Decision Making:  Patient discussed with Dr.  Antony Contras This is a telephone encounter between [name] and [employer] on 04/15/2021 for nausea, vomiting, diarrhea. The visit was conducted with the patient located at home and [employer] at [facility]. The patient's identity was confirmed using their DOB and current address. The patient has consented to being evaluated through a telephone encounter and understands the associated risks (an examination cannot be done and the patient may need to come in for an appointment) / benefits (allows the patient to remain at home, decreasing exposure to coronavirus). I personally spent 30 minutes on medical discussion.     Elige Radon, MD Internal Medicine Resident PGY-3 [facility] Internal Medicine Residency 04/15/2021 12:39 PM

## 2021-04-15 NOTE — Assessment & Plan Note (Addendum)
HPI: >12 month history of episodes of sudden onset nausea, vomiting and diarrhea associated with flushing. Symptoms can last 12-48 hours and resolve spontaneously although she does sometimes take some zofran which helps with the nausea. Onset does not correlate with eating. She thinks that symptoms may be more appreciable with large swings in her blood sugars but are not associated with hyper or hypoglycemia. No associated abdominal pain. No melena, hematochezia or hematemesis. No issues with constipation. Most recent episode occurred 1/22 and are still ongoing but slowly resolving. Frequency of episodes: ~1 every couple of months. She notes that she had her first hypoglycemic events this morning. Blood sugars have been remarkably improved though overall since increasing her long acting insulin. Assessment: etiology is unclear. She notes that someone had told her about a diabetic version of dumping syndrome. I did a brief literature review which does suggest some rare cases of autonomic dysfunction causing diarrhea, especially with significant glucose changes, however there does not seem to be a significant amount of information about it. I would still consider this to be a possibility. The large changes in her glucoses may be a trigger. I discussed alternative diagnoses including small bowel bacterial overgrowth and role of referral to GI. She does not feel that symptoms occur frequently enough for her to wish to pursue further workup at this time, however acknowledges that, if symptoms become more frequent, then she may change her mind.  Plan  zofran sent to pharmacy  She is agreeable to keeping a food diary and to bring it to her appointments for review  She wishes to continue to monitor symptoms for now and see how they evolve after glucoses stabilize. If frequency of episodes increase, consider referral to GI

## 2021-04-17 NOTE — Progress Notes (Signed)
Internal Medicine Clinic Attending  Case discussed with Dr. Christian  At the time of the visit.  We reviewed the resident's history and exam and pertinent patient test results.  I agree with the assessment, diagnosis, and plan of care documented in the resident's note.  

## 2021-04-22 ENCOUNTER — Other Ambulatory Visit: Payer: Self-pay

## 2021-04-22 ENCOUNTER — Encounter: Payer: Self-pay | Admitting: Dietician

## 2021-04-22 ENCOUNTER — Ambulatory Visit (INDEPENDENT_AMBULATORY_CARE_PROVIDER_SITE_OTHER): Payer: No Typology Code available for payment source | Admitting: Dietician

## 2021-04-22 ENCOUNTER — Other Ambulatory Visit (HOSPITAL_COMMUNITY): Payer: Self-pay

## 2021-04-22 DIAGNOSIS — Z794 Long term (current) use of insulin: Secondary | ICD-10-CM

## 2021-04-22 DIAGNOSIS — E1142 Type 2 diabetes mellitus with diabetic polyneuropathy: Secondary | ICD-10-CM | POA: Diagnosis not present

## 2021-04-22 MED FILL — Continuous Glucose System Sensor: 28 days supply | Qty: 2 | Fill #5 | Status: AC

## 2021-04-22 NOTE — Patient Instructions (Addendum)
Thank you for your visit today!  We talked about:   The things that might be causing more insulin resistance- mirena, food, less than ideal insulin The things that might be causing the nausea, vomiting, diarrhea   Goals to work on:   1- stop the probiotic 2- start prescription fish oil asap 3- Keep doing as you are  4- monitor the nausea and 'big event' as well  Please feel free to call me anytime.  Lupita Leash [phone]

## 2021-04-22 NOTE — Progress Notes (Signed)
Medical Nutrition Therapy:  Appt start time: 0830 end time:  0915. Total time: 45 minutes Visit # 1  Assessment:  Primary concerns today: proposed 'dumping' (simultaneous nausea,vomiting and diarrhea), high blood sugars Kelly Gallagher wears a CGM and monitors her glucose continuously. She is losing weight intentionally with assistance of semaglutide and dietary changes for the past 1-2 years for a total loss of 30#/27-28 months which is an appropriate rate of loss. Her dose of semaglutide was increased from 1mg  to 2 mg weekly last May that coincided with having covid and the start of her episodes of dumping (flushing, nausea, vomiting and diarrhea). They have continued for the past ~8 months with frequency 1x/month and they resolve in 12-24 hours. Her stated usual bowel habits are loose bowel movements 1-2x daily, watery when the dumping occurs.    BLOOD SUGAR:elevated fasting today with no food intake, she describes this as her usual and seems frustrated about this.  Her CGM data show flat, thin glucose profile mostly >200 mg/dl. This is likely due to insulin resistance more than insulin deficiency.  CGM Results from download:   % Time CGM active:   73 %   (Goal >70%)  Average glucose:   200 mg/dL for 14 days  Glucose management indicator:   8.1 %  Time in range (70-180 mg/dL):   28 %   (Goal 03-07-1990)  Time High (181-250 mg/dL):   63 %   (Goal < >40%)  Time Very High (>250 mg/dL):    9 %   (Goal < 5%)  Time Low (54-69 mg/dL):   0 %   (Goal 98%)  Time Very Low (<54 mg/dL):   0 %   (Goal <1%)  Coefficient of variation:   18.9 %   (Goal <36%)   Preferred Learning Style: No preference indicated  Learning Readiness: Ready and Change in progress  ANTHROPOMETRICS:Estimated body mass index is 35.53 kg/m as calculated from the following:   Height as of 01/13/21: 5\' 6"  (1.676 m).   Weight as of this encounter: 220 lb 1.6 oz (99.8 kg).  WEIGHT HISTORY:  Wt Readings from Last 10 Encounters:  04/22/21 220  lb 1.6 oz (99.8 kg)  04/02/21 220 lb 3.2 oz (99.9 kg)  01/13/21 222 lb (100.7 kg)  12/30/20 222 lb (100.7 kg)  10/16/20 230 lb (104.3 kg)  09/05/20 239 lb 11.2 oz (108.7 kg)  08/16/20 242 lb (109.8 kg)  07/24/20 247 lb (112 kg)  05/01/20 239 lb (108.4 kg)  04/15/20 237 lb (107.5 kg)    SLEEP:10 Pm to 630 am, tries to get 8 hours  MEDICATIONS: synjardy, Levemir 60 units qam,45 units, semaglutide 2mg  weekly, she states she takes Zofran about 4-5 times weekly for nausea SUPPLEMENTS:  1 billion [facility] generic acidophillous once a day 1 tablet, mvi womens centrum or [facility] generic multivitamin, fish oil- 1000mg  twice a day- nature made, bcomplex not only b12, vitmain d3    LABS:  Lab Results  Component Value Date   HGBA1C 8.5 (A) 04/02/2021   HGBA1C 8.0 (A) 07/24/2020   HGBA1C 7.9 (A) 03/28/2020   HGBA1C 8.7 (A) 11/20/2019   HGBA1C 7.9 (A) 06/15/2019     Lab Results  Component Value Date   CHOL 126 04/02/2021   CHOL 117 11/20/2019   Lab Results  Component Value Date   HDL 48 04/02/2021   HDL 47 11/20/2019   Lab Results  Component Value Date   LDLCALC 51 04/02/2021   LDLCALC 40 11/20/2019  Lab Results  Component Value Date   TRIG 160 (H) 04/02/2021   TRIG 187 (H) 11/20/2019   Lab Results  Component Value Date   CHOLHDL 2.6 04/02/2021   CHOLHDL 2.5 11/20/2019   No results found for: LDLDIRECT  DIETARY INTAKE: ( unable to open her food record so food recall is incomplete)  Usual eating pattern includes 3 meals and 1 snacks per day. Everyday foods include yogurt,berries,apples, oranges, carrots, celery, cream cheese  .  Avoided foods include low lactose milk, but eats lactose from othr sources.   Nausea/ Vomiting/ Diarrhea ~ 1x/ month Dining Out (times/week): need to assess at future visit 24-hr recall:  B ( 7-8 AM): oatmeal w/ walnuts,raisins or yogurt with granola and berries Snk ( ~3 PM): pretzels, cheese Beverages: low lactose milk, tea  Usual  physical activity: need to assess at future visit  Estimated energy needs: to maintain current weight 2225 calories 133-225 g carbohydrates   Progress Towards Goal(s):  In progress.   Nutritional Diagnosis:  NB-1.1 Food and nutrition-related knowledge deficit As related to lack of sufficient prior diabetes training.  As evidenced by her questions and report.    Intervention:  Nutrition education about insulin resistance, supplements, diabetes medicines, Continuous glucose monitoring report interpretation. We also discussed trial off of things that could be a  cause in her 'dumpng" such as the probiotic, the semaglutide at a reduced dose, lactose Action Goal:see patient instructions  Outcome goal: reduced symptoms of nausea, vomiting and diarrhea Coordination of care: request prescription fish oil  Teaching Method Utilized: Visual, Auditory,Hands on Handouts given during visit include: After visit summary Barriers to learning/adherence to lifestyle change: competing values Demonstrated degree of understanding via:  Teach Back   Monitoring/Evaluation:  Dietary intake, exercise, food, and body weight in 1 month(s). Norm Parcel, RD 04/22/2021 10:48 AM.

## 2021-04-23 ENCOUNTER — Other Ambulatory Visit (HOSPITAL_COMMUNITY): Payer: Self-pay

## 2021-04-23 ENCOUNTER — Other Ambulatory Visit: Payer: Self-pay | Admitting: Internal Medicine

## 2021-04-23 MED ORDER — FISH OIL 1000 MG PO CAPS
1000.0000 mg | ORAL_CAPSULE | Freq: Every day | ORAL | 0 refills | Status: AC
  Filled 2021-04-23 – 2021-10-13 (×3): qty 90, 90d supply, fill #0

## 2021-04-24 ENCOUNTER — Encounter: Payer: Self-pay | Admitting: Dietician

## 2021-04-24 ENCOUNTER — Other Ambulatory Visit (HOSPITAL_COMMUNITY): Payer: Self-pay

## 2021-05-02 ENCOUNTER — Other Ambulatory Visit (HOSPITAL_COMMUNITY): Payer: Self-pay

## 2021-05-02 ENCOUNTER — Ambulatory Visit (INDEPENDENT_AMBULATORY_CARE_PROVIDER_SITE_OTHER): Payer: No Typology Code available for payment source | Admitting: Student

## 2021-05-02 DIAGNOSIS — E1142 Type 2 diabetes mellitus with diabetic polyneuropathy: Secondary | ICD-10-CM | POA: Diagnosis not present

## 2021-05-02 DIAGNOSIS — Z794 Long term (current) use of insulin: Secondary | ICD-10-CM | POA: Diagnosis not present

## 2021-05-02 MED ORDER — LEVEMIR FLEXTOUCH 100 UNIT/ML ~~LOC~~ SOPN
PEN_INJECTOR | SUBCUTANEOUS | 5 refills | Status: DC
  Filled 2021-05-02: qty 30, 28d supply, fill #0
  Filled 2021-06-25: qty 30, 28d supply, fill #1
  Filled 2021-07-18: qty 30, 28d supply, fill #2

## 2021-05-02 NOTE — Progress Notes (Signed)
°  [facility] Internal Medicine Residency Telephone Encounter Continuity Care Appointment  HPI:  This telephone encounter was created for [name] on 05/02/2021 for the following purpose/cc f/u of T2DM. Identity verified via address and DOB. [name] reports that her blood glucose levels have remained elevated at home since her last visit. During last visit 1 month ago, her A1c found to be elevated to 8.5%. Her A1c levels have remained above goal despite medication adjustments. She is currently taking synjardy 12.5-1000mg  BID, ozempic 2mg  weekly, and levemir. Her levemir was increased from to 60 units in the morning and 40 units at night. However, her CGM shows that her blood glucose levels have remained elevated in the range of 180s-230s persistently despite the change in dose, likely indicating increasing insulin resistance. Discussed how she injects her insulin. Does report that she has a few hard nodules in her stomach. Discussed that she may be using her insulin in the same injection site causing increased tissue density and reduced insulin absorption from those sites. Advised to routinely change insulin injection sites (to also include back of arms or thighs).  Also discussed possible transition to U-200 so that she can get longer-acting insulin and would not have to inject as much insulin into her body for similar results. She states that she would prefer to remain on levemir at this time as she just got new refills, but she would be open to changing to [place] at next office visit in 2 months should her A1c remain above goal. At this time, will increase levemir to 60 units in the morning and 50 units at night. Advised patient that should her CBGs remain elevated to above 180s persistently after 1 week, then she can increase levemir dose to 60 units BID. She confirmed understanding.   Past Medical History:  Past Medical History:  Diagnosis Date   Allergy 12/30/2020   seasonal    Anxiety 12/30/2020   Diabetes mellitus without complication (HCC)    GERD (gastroesophageal reflux disease)    Hypertension      ROS:  Negative aside from that in HPI   Assessment / Plan / Recommendations:  Please see A&P under problem oriented charting for assessment of the patient's acute and chronic medical conditions.  As always, pt is advised that if symptoms worsen or new symptoms arise, they should go to an urgent care facility or to to ER for further evaluation.   Consent and Medical Decision Making:  Patient discussed with Dr. 03/01/2021 This is a telephone encounter between Cleda Daub and [name] on 05/02/2021 for f/u of T2DM. The visit was conducted with the patient located at home and 06/30/2021 at [facility] The patient's identity was confirmed using their DOB and current address. The patient has consented to being evaluated through a telephone encounter and understands the associated risks (an examination cannot be done and the patient may need to come in for an appointment) / benefits (allows the patient to remain at home, decreasing exposure to coronavirus). I personally spent 18 minutes on medical discussion.

## 2021-05-02 NOTE — Assessment & Plan Note (Signed)
Ms. Neiderer reports that her blood glucose levels have remained elevated at home since her last visit. During last visit 1 month ago, her A1c found to be elevated to 8.5%. Her A1c levels have remained above goal despite medication adjustments. She is currently taking synjardy 12.5-1000mg  BID, ozempic 2mg  weekly, and levemir. Her levemir was increased from to 60 units in the morning and 40 units at night. However, her CGM shows that her blood glucose levels have remained elevated in the range of 180s-230s persistently despite the change in dose, likely indicating increasing insulin resistance.  Discussed how she injects her insulin. Does report that she has a few hard nodules in her stomach. Discussed that she may be using her insulin in the same injection site causing increased tissue density and reduced insulin absorption from those sites. Advised to routinely change insulin injection sites (to also include back of arms or thighs).   Also discussed possible transition to [place] U-200 so that she can get longer-acting insulin and would not have to inject as much insulin into her body for similar results. She states that she would prefer to remain on levemir at this time as she just got new refills, but she would be open to changing to [place] at next office visit in 2 months should her A1c remain above goal. At this time, will increase levemir to 60 units in the morning and 50 units at night. Advised patient that should her CBGs remain elevated to above 180s persistently after 1 week, then she can increase levemir dose to 60 units BID. She confirmed understanding.  Plan: -Continue synjardy and ozempic -increase levemir to 60u morning and 50u at night -use different injection sites -if CBGs staying >180s after 1 week, increase levemir to 60u BID -consider changing to tresiba at next visit -repeat A1c in 2 months

## 2021-05-05 NOTE — Progress Notes (Signed)
Internal Medicine Clinic Attending  Case discussed with Dr. Jinwala  At the time of the visit.  We reviewed the resident's history and exam and pertinent patient test results.  I agree with the assessment, diagnosis, and plan of care documented in the resident's note.  

## 2021-05-10 ENCOUNTER — Other Ambulatory Visit (HOSPITAL_COMMUNITY): Payer: Self-pay

## 2021-05-13 LAB — HM DIABETES EYE EXAM

## 2021-05-21 ENCOUNTER — Other Ambulatory Visit: Payer: Self-pay | Admitting: Internal Medicine

## 2021-05-21 ENCOUNTER — Other Ambulatory Visit: Payer: Self-pay

## 2021-05-21 ENCOUNTER — Other Ambulatory Visit (HOSPITAL_COMMUNITY): Payer: Self-pay

## 2021-05-21 DIAGNOSIS — R635 Abnormal weight gain: Secondary | ICD-10-CM

## 2021-05-21 DIAGNOSIS — E1142 Type 2 diabetes mellitus with diabetic polyneuropathy: Secondary | ICD-10-CM

## 2021-05-23 ENCOUNTER — Other Ambulatory Visit (HOSPITAL_COMMUNITY): Payer: Self-pay

## 2021-05-26 ENCOUNTER — Encounter: Payer: Self-pay | Admitting: Internal Medicine

## 2021-05-27 ENCOUNTER — Other Ambulatory Visit (HOSPITAL_COMMUNITY): Payer: Self-pay

## 2021-05-28 ENCOUNTER — Other Ambulatory Visit: Payer: Self-pay | Admitting: Internal Medicine

## 2021-05-28 ENCOUNTER — Ambulatory Visit: Payer: No Typology Code available for payment source | Admitting: Dietician

## 2021-05-28 ENCOUNTER — Other Ambulatory Visit (HOSPITAL_COMMUNITY): Payer: Self-pay

## 2021-05-28 MED ORDER — FREESTYLE LIBRE 2 SENSOR MISC
1.0000 [IU] | 11 refills | Status: DC
  Filled 2021-05-28: qty 2, 28d supply, fill #0
  Filled 2021-06-25: qty 2, 28d supply, fill #1
  Filled 2021-07-18: qty 2, 28d supply, fill #2
  Filled 2021-09-08: qty 2, 28d supply, fill #3
  Filled 2021-10-02: qty 2, 28d supply, fill #4
  Filled 2021-12-17: qty 2, 28d supply, fill #5
  Filled 2022-01-19: qty 2, 28d supply, fill #6
  Filled 2022-02-18: qty 2, 28d supply, fill #7
  Filled 2022-03-13: qty 2, 28d supply, fill #8
  Filled 2022-04-10 – 2022-04-18 (×2): qty 2, 28d supply, fill #9
  Filled 2022-05-12: qty 2, 28d supply, fill #10

## 2021-05-29 ENCOUNTER — Other Ambulatory Visit (HOSPITAL_COMMUNITY): Payer: Self-pay

## 2021-06-04 ENCOUNTER — Other Ambulatory Visit (HOSPITAL_COMMUNITY): Payer: Self-pay

## 2021-06-07 ENCOUNTER — Encounter: Payer: Self-pay | Admitting: Internal Medicine

## 2021-06-07 DIAGNOSIS — R635 Abnormal weight gain: Secondary | ICD-10-CM

## 2021-06-07 DIAGNOSIS — E1142 Type 2 diabetes mellitus with diabetic polyneuropathy: Secondary | ICD-10-CM

## 2021-06-09 ENCOUNTER — Other Ambulatory Visit (HOSPITAL_COMMUNITY): Payer: Self-pay

## 2021-06-09 MED ORDER — OZEMPIC (2 MG/DOSE) 8 MG/3ML ~~LOC~~ SOPN
2.0000 mg | PEN_INJECTOR | SUBCUTANEOUS | 2 refills | Status: DC
  Filled 2021-06-09: qty 3, 28d supply, fill #0
  Filled 2021-07-07: qty 3, 28d supply, fill #1

## 2021-06-11 ENCOUNTER — Ambulatory Visit: Payer: No Typology Code available for payment source | Admitting: Dietician

## 2021-06-25 ENCOUNTER — Other Ambulatory Visit (HOSPITAL_COMMUNITY): Payer: Self-pay

## 2021-06-26 ENCOUNTER — Ambulatory Visit (INDEPENDENT_AMBULATORY_CARE_PROVIDER_SITE_OTHER): Payer: No Typology Code available for payment source | Admitting: Internal Medicine

## 2021-06-26 ENCOUNTER — Other Ambulatory Visit (HOSPITAL_COMMUNITY): Payer: Self-pay

## 2021-06-26 ENCOUNTER — Other Ambulatory Visit (HOSPITAL_COMMUNITY)
Admission: RE | Admit: 2021-06-26 | Discharge: 2021-06-26 | Disposition: A | Discharge status: Home / Self Care | Payer: No Typology Code available for payment source | Source: Ambulatory Visit | Attending: Internal Medicine | Admitting: Internal Medicine

## 2021-06-26 ENCOUNTER — Encounter: Payer: No Typology Code available for payment source | Admitting: Internal Medicine

## 2021-06-26 ENCOUNTER — Encounter: Payer: Self-pay | Admitting: Dietician

## 2021-06-26 ENCOUNTER — Ambulatory Visit (INDEPENDENT_AMBULATORY_CARE_PROVIDER_SITE_OTHER): Payer: No Typology Code available for payment source | Admitting: Dietician

## 2021-06-26 VITALS — BP 94/67 | HR 65 | Temp 98.3°F | Ht 66.0 in | Wt 220.5 lb

## 2021-06-26 DIAGNOSIS — Z713 Dietary counseling and surveillance: Secondary | ICD-10-CM | POA: Diagnosis not present

## 2021-06-26 DIAGNOSIS — B3731 Acute candidiasis of vulva and vagina: Secondary | ICD-10-CM | POA: Diagnosis present

## 2021-06-26 DIAGNOSIS — Z794 Long term (current) use of insulin: Secondary | ICD-10-CM

## 2021-06-26 DIAGNOSIS — E1142 Type 2 diabetes mellitus with diabetic polyneuropathy: Secondary | ICD-10-CM

## 2021-06-26 LAB — POCT GLYCOSYLATED HEMOGLOBIN (HGB A1C): Hemoglobin A1C: 7.5 % — AB (ref 4.0–5.6)

## 2021-06-26 LAB — GLUCOSE, CAPILLARY: Glucose-Capillary: 108 mg/dL — ABNORMAL HIGH (ref 70–99)

## 2021-06-26 MED ORDER — METFORMIN HCL 1000 MG PO TABS
500.0000 mg | ORAL_TABLET | Freq: Two times a day (BID) | ORAL | 1 refills | Status: DC
  Filled 2021-06-26: qty 90, 90d supply, fill #0

## 2021-06-26 MED ORDER — FLUCONAZOLE 150 MG PO TABS
150.0000 mg | ORAL_TABLET | ORAL | 0 refills | Status: AC
  Filled 2021-06-26: qty 3, 9d supply, fill #0

## 2021-06-26 NOTE — Patient Instructions (Addendum)
Goals are to increase  Fiber  and veggies  ? ? First we'll get a baseline.  ? ?You want to track your fiber grams for the next week and then communicate back to me through  mychart.  ? ?Great to see you today.  ? Lupita Leash ?[phone] ? ?

## 2021-06-26 NOTE — Progress Notes (Signed)
?   Nutrition counseling:  Appt start time: 1420 end time:  1450. ?Total time: 30 minutes ?Visit # 2 ? ?Assessment:  Primary concerns today: weight loss and diabetes friendly meal planning ?Kelly Gallagher states her nausea is better. She realized when out of Ozempic that it went away. When she restarted it reoccurred but she is better able to handle it due to knowing when and what is causing it.  Her weight is stable and she would like to decrease it more. She would like help making changes to her lifestyle to assist with that and habits to encourage weight stability once she comes off of the semaglutide.  ?  ?BLOOD SUGAR:has been out of libre sensors, anticipates starting one today.  ?Lab Results  ?Component Value Date  ? HGBA1C 7.5 (A) 06/26/2021  ? HGBA1C 8.5 (A) 04/02/2021  ? HGBA1C 8.0 (A) 07/24/2020  ? HGBA1C 7.9 (A) 03/28/2020  ? HGBA1C 8.7 (A) 11/20/2019  ?  ?ANTHROPOMETRICS:Estimated body mass index is 35.59 kg/m? as calculated from the following: ?  Height as of an earlier encounter on 06/26/21: 5\' 6"  (1.676 m). ?  Weight as of an earlier encounter on 06/26/21: 220 lb 8 oz (100 kg). ? ?WEIGHT HISTORY:  ?Wt Readings from Last 10 Encounters:  ?06/26/21 220 lb 8 oz (100 kg)  ?04/22/21 220 lb 1.6 oz (99.8 kg)  ?04/02/21 220 lb 3.2 oz (99.9 kg)  ?01/13/21 222 lb (100.7 kg)  ?12/30/20 222 lb (100.7 kg)  ?10/16/20 230 lb (104.3 kg)  ?09/05/20 239 lb 11.2 oz (108.7 kg)  ?08/16/20 242 lb (109.8 kg)  ?07/24/20 247 lb (112 kg)  ?05/01/20 239 lb (108.4 kg)  ? ? ?SLEEP:10 Pm to 630 am, tries to get 8 hours ? ?MEDICATIONS: synjardy, Levemir 60 units qam,50 units, semaglutide 2mg  weekly, she states she takes Zofran less often now about 1 time ~ 2 months ? ?DIETARY INTAKE: ?Usual eating pattern includes 3 meals and 1 snacks per day. ?Everyday foods include yogurt,berries,apples, oranges, carrots, celery, eggs ground 06/29/20.  Avoided foods include low lactose milk, but eats lactose from othr sources.    Nausea/ Vomiting/ Diarrhea ~  1x/ 1-2 months ?Dining Out (times/week): seldom but at times at work ?24-hr recall:  ?B ( 7-8 AM): oatmeal w/ walnuts,raisins or yogurt with granola and berries ? ?Usual physical activity: less active at work, cooks and walks dog, thinking about starting to move more. ? ?Estimated energy needs: to maintain current weight ?2225 calories ?133-225 g carbohydrates ? ? ?Progress Towards Goal(s):  Some progress. ? ?   ?Intervention:  Nutrition education about insulin resistance,types of fat and their affects, fiber/prebiotics and their role in weight management, activity role in weight management and using small amounts of nutritive sweetener instead of non nutritive sweeteners. [place]  ?Action Goal:see patient instructions ? Outcome goal: reduced weight, increased whole foods diet ?Coordination of care: none ? ?Teaching Method Utilized: Visual, Auditory,Hands on ?Handouts given during visit include: After visit summary ?Barriers to learning/adherence to lifestyle change: competing values ?Demonstrated degree of understanding via:  Teach Back  ? ?Monitoring/Evaluation:  Dietary intake, exercise, food, and body weight in 3 month(s) we will try to communicate every 2 weeks by my chart. 2226 ?Marland Kitchen, RD ?06/26/2021 ?3:21 PM. ? ? ?

## 2021-06-26 NOTE — Progress Notes (Signed)
? ?  CC: Type 2 diabetes mellitus and concern for yeast infection ? ?HPI:[name] is a 51 y.o. female who presents for evaluation of type 2 diabetes mellitus and concern for yeast infection. Please see individual problem based A/P for details. ? ? ?Past Medical History:  ?Diagnosis Date  ? Allergy 12/30/2020  ? seasonal  ? Anxiety 12/30/2020  ? Diabetes mellitus without complication (HCC)   ? GERD (gastroesophageal reflux disease)   ? Hypertension   ? ?Review of Systems:   ?Review of Systems  ?Constitutional:  Negative for chills and fever.  ?Genitourinary:  Negative for dysuria.  ?     Discharge+, vulvovaginal irritation+   ? ?Physical Exam: ?Vitals:  ? 06/26/21 1317  ?BP: 94/67  ?Pulse: 65  ?Temp: 98.3 ?F (36.8 ?C)  ?TempSrc: Oral  ?SpO2: 98%  ?Weight: 220 lb 8 oz (100 kg)  ?Height: 5\' 6"  (1.676 m)  ? ?Physical Exam ?Constitutional:   ?   General: She is not in acute distress. ?   Appearance: She is obese. She is not ill-appearing.  ?Cardiovascular:  ?   Rate and Rhythm: Normal rate and regular rhythm.  ?Pulmonary:  ?   Effort: Pulmonary effort is normal.  ?   Breath sounds: Normal breath sounds.  ?Abdominal:  ?   General: Bowel sounds are normal.  ?   Palpations: Abdomen is soft.  ?   Tenderness: There is no abdominal tenderness.  ?Psychiatric:     ?   Mood and Affect: Mood normal.     ?   Behavior: Behavior normal.  ? ? ? ?Assessment & Plan:  ? ?See Encounters Tab for problem based charting. ? ?Patient discussed with Dr. ? ?

## 2021-06-26 NOTE — Patient Instructions (Signed)
Thank you for trusting me with your care. To recap, today we discussed the following: ? ? ?1. Controlled type 2 diabetes mellitus with diabetic polyneuropathy, with long-term current use of insulin ([place]) ?Lab Results  ?Component Value Date  ? HGBA1C 7.5 (A) 06/26/2021  ?Stop Synjardy for now ?- POC Hbg A1C ?- metFORMIN (GLUCOPHAGE) 1000 MG tablet; Take 0.5 tablets (500 mg total) by mouth 2 (two) times daily with a meal.  Dispense: 90 tablet; Refill: 1 ? ?2. Recurrent candidiasis of vagina ?- Cervicovaginal ancillary only ?- fluconazole (DIFLUCAN) 150 MG tablet; Take 1 tablet (150 mg total) by mouth every 3 (three) days for 3 doses.  Dispense: 3 tablet; Refill: 0 ?

## 2021-06-27 ENCOUNTER — Telehealth: Payer: Self-pay | Admitting: Internal Medicine

## 2021-06-27 ENCOUNTER — Other Ambulatory Visit (HOSPITAL_COMMUNITY): Payer: Self-pay

## 2021-06-27 ENCOUNTER — Encounter: Payer: Self-pay | Admitting: Internal Medicine

## 2021-06-27 LAB — CERVICOVAGINAL ANCILLARY ONLY
Candida Glabrata: POSITIVE — AB
Candida Vaginitis: NEGATIVE
Comment: NEGATIVE
Comment: NEGATIVE

## 2021-06-27 NOTE — Telephone Encounter (Signed)
Unable to reach patient by telephone.

## 2021-06-27 NOTE — Assessment & Plan Note (Signed)
Patient reports recurrent symptoms of vulvovaginal irritation and white discharge. Reports fluconazole has helped symptoms and request refill of prescription. Patient reports she has plan to have pelvic exam and pap smear with Ob/Gyn at the end of May. Plan as below, but may need further workup and plan for pelvic exam if not improving.  ?-Will refill today and order cervicovaginal swab, history of Candida glabrata in 2021.  ?-Stopping Empaglifozin today ?- Given Rx of fluconazole, will follow up cervicovaginal results and patient may need different treatment. Fluconazole (DIFLUCAN) 150 MG tablet; Take 1 tablet (150 mg total) by mouth every 3 (three) days for 3 doses.  Dispense: 3 tablet; Refill: 0 ? ? ? ?

## 2021-06-27 NOTE — Assessment & Plan Note (Signed)
Hgb A1c 8.5 % at the last visit. Current A1c 7.5%. PM Levimir increased from 40 units to 50 units last visit. Patient dose not have glucometer for review today. She is out of her libre sensors. We discussed her n/v/and diarrhea. This started with increased dose of Ozempic and improved when pharmacy was unable to fill. She restarted and symptoms continued but now have resolved with the exception of some loose bowel movement. The last month she has not needed to take Zofran. She is having another episode of vaginal discharge with irritation of vulva and vagina.Further mention of this concern under vaginal candidiasis.   ? ?Assessment/Plan: Type 2 Diabetes Mellitus with long term insulin use, complication of polyneuropathy. Chronic problem, controlled.  ?- Given recurrent vaginal candidiasis plan to stop Synjardy. Will prescribe Metformin and make decision to restart empaglifozin after further testing and treatment.  ?- metFORMIN (GLUCOPHAGE) 1000 MG tablet; Take 1/2 tablets (500 mg total) by mouth 2 (two) times daily with a meal.  Dispense: 90 tablet; Refill: 1 ?- Continue Ozempic 2mg  weekly , symptoms improving. ?- Levimir 60 units in AM, 50 units PM ?- Meeting with RD, today for nutrition counseling and weight loss.  ?- Encouraged daily exercise routine ?- Picking up Ferney sensors for monitoring  ?- Repeat A1c at next visit if after 3 months from this date ?

## 2021-06-28 ENCOUNTER — Encounter: Payer: Self-pay | Admitting: Internal Medicine

## 2021-07-01 ENCOUNTER — Other Ambulatory Visit (HOSPITAL_COMMUNITY): Payer: Self-pay

## 2021-07-01 MED ORDER — METFORMIN HCL 1000 MG PO TABS
1000.0000 mg | ORAL_TABLET | Freq: Two times a day (BID) | ORAL | 1 refills | Status: DC
  Filled 2021-07-01: qty 90, 45d supply, fill #0
  Filled 2021-07-05: qty 180, 90d supply, fill #0

## 2021-07-01 NOTE — Progress Notes (Signed)
Internal Medicine Clinic Attending  Case discussed with Dr. Steen  At the time of the visit.  We reviewed the resident's history and exam and pertinent patient test results.  I agree with the assessment, diagnosis, and plan of care documented in the resident's note.  

## 2021-07-01 NOTE — Addendum Note (Signed)
Addended by: Gardenia Phlegm on: 07/01/2021 09:40 AM ? ? Modules accepted: Orders ? ?

## 2021-07-02 ENCOUNTER — Encounter: Payer: No Typology Code available for payment source | Admitting: Internal Medicine

## 2021-07-05 ENCOUNTER — Other Ambulatory Visit (HOSPITAL_COMMUNITY): Payer: Self-pay

## 2021-07-07 ENCOUNTER — Other Ambulatory Visit (HOSPITAL_COMMUNITY): Payer: Self-pay

## 2021-07-14 ENCOUNTER — Encounter: Payer: Self-pay | Admitting: Internal Medicine

## 2021-07-18 ENCOUNTER — Other Ambulatory Visit (HOSPITAL_COMMUNITY): Payer: Self-pay

## 2021-07-18 ENCOUNTER — Other Ambulatory Visit: Payer: Self-pay | Admitting: Internal Medicine

## 2021-07-18 MED ORDER — EMPAGLIFLOZIN 25 MG PO TABS
25.0000 mg | ORAL_TABLET | Freq: Every day | ORAL | 1 refills | Status: DC
  Filled 2021-07-18: qty 90, 90d supply, fill #0
  Filled 2021-10-13: qty 90, 90d supply, fill #1

## 2021-07-18 MED ORDER — INSULIN DETEMIR 100 UNIT/ML FLEXPEN
PEN_INJECTOR | SUBCUTANEOUS | 3 refills | Status: DC
  Filled 2021-07-18: qty 30, 28d supply, fill #0
  Filled 2021-08-13: qty 30, 28d supply, fill #1
  Filled 2021-09-08: qty 30, 28d supply, fill #2
  Filled 2021-10-06: qty 30, 28d supply, fill #3

## 2021-07-18 NOTE — Progress Notes (Signed)
We have discussed recurrent yeast infections. Patient requested to restart Synjardy. Patient is on Metformin and will send Empaglifozin to pharmacy. Separating medications incase patient needs to stop Empaglifozin.  ?

## 2021-07-19 ENCOUNTER — Other Ambulatory Visit (HOSPITAL_COMMUNITY): Payer: Self-pay

## 2021-07-21 ENCOUNTER — Other Ambulatory Visit (HOSPITAL_COMMUNITY): Payer: Self-pay

## 2021-07-29 ENCOUNTER — Emergency Department (HOSPITAL_COMMUNITY)
Admission: EM | Admit: 2021-07-29 | Discharge: 2021-07-30 | Disposition: A | Discharge status: Home / Self Care | Payer: No Typology Code available for payment source | Attending: Student | Admitting: Student

## 2021-07-29 ENCOUNTER — Emergency Department (HOSPITAL_COMMUNITY): Payer: No Typology Code available for payment source

## 2021-07-29 DIAGNOSIS — Z7984 Long term (current) use of oral hypoglycemic drugs: Secondary | ICD-10-CM | POA: Diagnosis not present

## 2021-07-29 DIAGNOSIS — R42 Dizziness and giddiness: Secondary | ICD-10-CM | POA: Insufficient documentation

## 2021-07-29 DIAGNOSIS — R197 Diarrhea, unspecified: Secondary | ICD-10-CM | POA: Insufficient documentation

## 2021-07-29 DIAGNOSIS — N9489 Other specified conditions associated with female genital organs and menstrual cycle: Secondary | ICD-10-CM | POA: Diagnosis not present

## 2021-07-29 DIAGNOSIS — Z794 Long term (current) use of insulin: Secondary | ICD-10-CM | POA: Insufficient documentation

## 2021-07-29 DIAGNOSIS — R112 Nausea with vomiting, unspecified: Secondary | ICD-10-CM | POA: Insufficient documentation

## 2021-07-29 DIAGNOSIS — E119 Type 2 diabetes mellitus without complications: Secondary | ICD-10-CM | POA: Insufficient documentation

## 2021-07-29 LAB — URINALYSIS, ROUTINE W REFLEX MICROSCOPIC
Bacteria, UA: NONE SEEN
Bilirubin Urine: NEGATIVE
Glucose, UA: >=500 mg/dL — AB
Hgb urine dipstick: NEGATIVE
Ketones, ur: 80 mg/dL — AB
Leukocytes,Ua: NEGATIVE
Nitrite: NEGATIVE
Protein, ur: NEGATIVE mg/dL
Specific Gravity, Urine: 1.036 — ABNORMAL HIGH (ref 1.005–1.030)
pH: 5 (ref 5.0–8.0)

## 2021-07-29 LAB — BASIC METABOLIC PANEL
Anion gap: 16 — ABNORMAL HIGH (ref 5–15)
BUN: 14 mg/dL (ref 6–20)
CO2: 24 mmol/L (ref 22–32)
Calcium: 10.6 mg/dL — ABNORMAL HIGH (ref 8.9–10.3)
Chloride: 96 mmol/L — ABNORMAL LOW (ref 98–111)
Creatinine, Ser: 0.79 mg/dL (ref 0.44–1.00)
GFR, Estimated: >60 mL/min (ref >60)
Glucose, Bld: 220 mg/dL — ABNORMAL HIGH (ref 70–99)
Potassium: 2.9 mmol/L — ABNORMAL LOW (ref 3.5–5.1)
Sodium: 136 mmol/L (ref 135–145)

## 2021-07-29 LAB — CBG MONITORING, ED: Glucose-Capillary: 250 mg/dL — ABNORMAL HIGH (ref 70–99)

## 2021-07-29 LAB — CBC
HCT: 51.8 % — ABNORMAL HIGH (ref 36.0–46.0)
Hemoglobin: 16.9 g/dL — ABNORMAL HIGH (ref 12.0–15.0)
MCH: 28 pg (ref 26.0–34.0)
MCHC: 32.6 g/dL (ref 30.0–36.0)
MCV: 85.8 fL (ref 80.0–100.0)
Platelets: 385 10*3/uL (ref 150–400)
RBC: 6.04 MIL/uL — ABNORMAL HIGH (ref 3.87–5.11)
RDW: 12.5 % (ref 11.5–15.5)
WBC: 27 10*3/uL — ABNORMAL HIGH (ref 4.0–10.5)
nRBC: 0 % (ref 0.0–0.2)

## 2021-07-29 LAB — LIPASE, BLOOD: Lipase: 38 U/L (ref 11–51)

## 2021-07-29 LAB — I-STAT BETA HCG BLOOD, ED (MC, WL, AP ONLY): I-stat hCG, quantitative: <5 m[IU]/mL (ref <5)

## 2021-07-29 MED ORDER — SODIUM CHLORIDE 0.9 % IV BOLUS
1000.0000 mL | Freq: Once | INTRAVENOUS | Status: AC
Start: 2021-07-29 — End: 2021-07-30
  Administered 2021-07-29: 1000 mL via INTRAVENOUS

## 2021-07-29 MED ORDER — ONDANSETRON 4 MG PO TBDP
4.0000 mg | ORAL_TABLET | Freq: Once | ORAL | Status: AC
  Administered 2021-07-29: 4 mg via ORAL
  Filled 2021-07-29: qty 1

## 2021-07-29 MED ORDER — POTASSIUM CHLORIDE 10 MEQ/100ML IV SOLN
10.0000 meq | INTRAVENOUS | Status: AC
  Administered 2021-07-29 – 2021-07-30 (×2): 10 meq via INTRAVENOUS
  Filled 2021-07-29 (×2): qty 100

## 2021-07-29 MED ORDER — IOHEXOL 300 MG/ML  SOLN
100.0000 mL | Freq: Once | INTRAMUSCULAR | Status: AC | PRN
  Administered 2021-07-29: 100 mL via INTRAVENOUS

## 2021-07-29 MED ORDER — ONDANSETRON HCL 4 MG/2ML IJ SOLN
4.0000 mg | Freq: Once | INTRAMUSCULAR | Status: AC
  Administered 2021-07-29: 4 mg via INTRAVENOUS
  Filled 2021-07-29: qty 2

## 2021-07-29 MED ORDER — POTASSIUM CHLORIDE CRYS ER 20 MEQ PO TBCR
20.0000 meq | EXTENDED_RELEASE_TABLET | Freq: Every day | ORAL | 0 refills | Status: DC
  Filled 2021-07-29: qty 7, 7d supply, fill #0

## 2021-07-29 NOTE — ED Provider Triage Note (Signed)
Emergency Medicine Provider Triage Evaluation Note ? ?Kelly Gallagher , a 51 y.o. female  was evaluated in triage.  Pt complains of nausea vomiting some abdominal pain and feeling lightheaded/dizzy.  She states her symptoms have been ongoing since about an hour before arrival to the emergency room.  She denies any chest pain or difficulty breathing. ? ?On my arrival in the room patient is laying on the ground with emesis surrounding.  She is able to answer my questions however she is having difficulty getting up.  She was able to be lifted with the help of several other medical personnel. ? ? ? ?Review of Systems  ?Positive: Nausea vomiting diarrhea abdominal pain fatigue, lightheadedness, dizziness ?Negative: Fever ? ?Physical Exam  ?BP 120/76 (BP Location: Right Arm)   Pulse 89   Resp 18   Ht 5\' 5"  (1.651 m)   Wt 96.2 kg   SpO2 99%   BMI 35.28 kg/m?  ?Gen:   Awake, disheveled, uncomfortable, fatigued ?Resp:  Normal effort  ?MSK:   Moves extremities without difficulty  ?Other:  Abdomen soft nontender.  No guarding or rebound. ? ?Medical Decision Making  ?Medically screening exam initiated at 6:34 PM.  Appropriate orders placed.  was informed that the remainder of the evaluation will be completed by another provider, this initial triage assessment does not replace that evaluation, and the importance of remaining in the ED until their evaluation is complete. ? ?Labs, Zofran, normal saline 1 L ordered ?  ?Kelly Gallagher, Gailen Shelter ?07/29/21 1836 ? ?

## 2021-07-29 NOTE — ED Triage Notes (Signed)
EMS stated, she started having sudden onset of N/V/D about a hour ago usually goes away. Has the episodes occasionally and usually goes away, this time its not going away.  ?

## 2021-07-29 NOTE — ED Provider Notes (Signed)
?[facility] EMERGENCY DEPARTMENT ?Provider Note ? ? ?CSN: [id number] ?Arrival date & time: 07/29/21  1750 ? ?  ? ?History ? ?Chief Complaint  ?Patient presents with  ? Nausea  ? Emesis  ? Diarrhea  ? Dizziness  ? ? ?Kelly Gallagher is a 51 y.o. female. ? ?50 y.o female with a PMH of DM on Ozempic presents to the ED with a chief complaint of nausea, vomiting and diarrhea. Patient reports her symptoms began around 4 PM today while she was at work, began to break into a sweat headed to the bathroom, vomited then had an onset episode of none bloody diarrhea.  Patient reports the diarrhea and vomiting went on for about an hour, she did try to take some ODT Zofran but immediately vomited after.  She began to continue to feel dizzy, nauseated they called 911 who rushed her to the emergency department.  Patient reports these episodes are recurrent, she has been on Ozempic for about a year, however is not sure whether this is causing these episodes.  She also had an episode while in the ED waiting room, when she broke into a sweat, began to have diarrhea and soiled herself.  She does report the episodes have been ongoing for the past 2 to 3 weeks, where she is holding the trash can while sitting on the toilet.  Does report not having any episodes last month when she did not receive her Ozempic injection.  She has talked to her PCP about the side effects, however they were just told to monitor these.  She does not have any fever, no urinary symptoms, no abdominal pain, no sick contacts.  Prior surgical intervention of her abdomen with cholecystectomy. ? ?The history is provided by the patient and medical records.  ?Emesis ?Severity:  Moderate ?Duration:  5 hours ?Timing:  Constant ?Number of daily episodes:  3 ?Quality:  Unable to specify ?Able to tolerate:  Liquids ?How soon after eating does vomiting occur:  4 hours ?Ineffective treatments:  Antiemetics ?Associated symptoms: chills and diarrhea   ?Associated  symptoms: no abdominal pain and no sore throat   ?Risk factors: diabetes   ?Diarrhea ?Associated symptoms: chills and vomiting   ?Associated symptoms: no abdominal pain   ?Dizziness ?Associated symptoms: diarrhea, nausea and vomiting   ?Associated symptoms: no blood in stool and no shortness of breath   ? ?  ? ?Home Medications ?Prior to Admission medications   ?Medication Sig Start Date End Date Taking? Authorizing Provider  ?ondansetron (ZOFRAN-ODT) 4 MG disintegrating tablet Dissolve 1 tablet (4 mg total) by mouth every 8 (eight) hours as needed for nausea or vomiting. 07/30/21  Yes Trudee Grip A, PA-C  ?potassium chloride SA (KLOR-CON M) 20 MEQ tablet Take 1 tablet (20 mEq total) by mouth daily for 7 doses. 07/29/21 08/06/21 Yes Claude Manges, PA-C  ?Albuterol Sulfate 108 (90 Base) MCG/ACT AEPB Inhale 2 puffs into the lungs every 4 (four) hours as needed. 04/28/18   Burns Spain, MD  ?ALPRAZolam Prudy Feeler) 0.5 MG tablet Take 1 tablet by mouth daily as needed for anxiety. 03/05/21 09/01/21  Albertha Ghee, MD  ?atorvastatin (LIPITOR) 20 MG tablet TAKE 1 TABLET BY MOUTH ONCE A DAY 04/08/21 04/08/22  Albertha Ghee, MD  ?betamethasone dipropionate 0.05 % cream APPLY TO THE AFFECTED AREA(S) TWO TIMES DAILY 07/30/20 08/05/21  Remo Lipps, MD  ?Cholecalciferol (VITAMIN D3) 10 MCG (400 UNIT) tablet daily. 05/05/19   [provider]  ?clotrimazole (LOTRIMIN) 1 %  cream Apply topically 2 times daily. 04/14/21   Elige Radon, MD  ?Continuous Blood Gluc Receiver (FREESTYLE LIBRE 2 READER SYSTM) DEVI 1 each by Does not apply route 4 (four) times daily. 09/14/19   Reymundo Poll, MD  ?Continuous Blood Gluc Sensor (FREESTYLE LIBRE 2 SENSOR) MISC Use 1 sensor every 14 days as directed. 05/28/21   Albertha Ghee, MD  ?DULoxetine (CYMBALTA) 60 MG capsule Take 1 capsule by mouth daily. 03/26/21 03/26/22  Albertha Ghee, MD  ?empagliflozin (JARDIANCE) 25 MG TABS tablet Take 1 tablet (25 mg total) by mouth daily before breakfast.  07/18/21   Albertha Ghee, MD  ?insulin detemir (LEVEMIR) 100 UNIT/ML FlexPen Inject into the skin. 05/02/21   Merrilyn Puma, MD  ?Insulin Pen Needle (UNIFINE PENTIPS) 32G X 4 MM MISC USE TO INJECT INSULIN INTO THE SKIN 2 TIMES DAILY 07/30/20 09/23/21  Remo Lipps, MD  ?levonorgestrel (MIRENA, 52 MG,) 20 MCG/DAY IUD  05/05/19   [provider]  ?loratadine (CLARITIN) 10 MG tablet Take 10 mg by mouth daily.    [provider]  ?metFORMIN (GLUCOPHAGE) 1000 MG tablet Take 1 tablet (1,000 mg total) by mouth 2 (two) times daily with a meal. 07/01/21 07/01/22  Albertha Ghee, MD  ?metoprolol succinate (TOPROL-XL) 100 MG 24 hr tablet TAKE 1 TABLET BY MOUTH DAILY WITH OR IMMEDIATELY FOLLOWING A MEAL 12/01/20 12/01/21  Albertha Ghee, MD  ?Multiple Vitamin (MULTIVITAMIN) tablet Take 1 tablet by mouth daily.    [provider]  ?olmesartan-hydrochlorothiazide (BENICAR HCT) 20-12.5 MG tablet TAKE 1 TABLET BY MOUTH ONCE A DAY 02/11/21 02/11/22  Albertha Ghee, MD  ?Omega-3 Fatty Acids (FISH OIL) 1000 MG CAPS Take 1 capsule (1,000 mg total) by mouth daily. 04/23/21   Elige Radon, MD  ?omeprazole (PRILOSEC) 20 MG capsule TAKE 1 CAPSULE BY MOUTH ONCE A DAY 04/08/21 04/08/22  Albertha Ghee, MD  ?ondansetron [facility]) 4 MG tablet Take 1 tablet by mouth every 8 hours as needed for nausea or vomiting. 04/14/21   Elige Radon, MD  ?Probiotic Product (PROBIOTIC-10 PO) Take by mouth.    [provider]  ?Semaglutide, 2 MG/DOSE, (OZEMPIC, 2 MG/DOSE,) 8 MG/3ML SOPN Inject 2 mg into the skin once a week. 06/09/21   Albertha Ghee, MD  ?vitamin B-12 (CYANOCOBALAMIN) 1000 MCG tablet Take by mouth.    [provider]  ?Insulin Glargine (BASAGLAR KWIKPEN) 100 UNIT/ML Inject 60 Units into the skin daily AND 40 Units at bedtime. 05/06/20 06/07/20  Remo Lipps, MD  ?   ? ?Allergies    ?Ace inhibitors and Effexor xr [venlafaxine hcl]   ? ?Review of Systems   ?Review of Systems  ?Constitutional:  Positive for chills.   ?HENT:  Negative for sore throat.   ?Respiratory:  Negative for shortness of breath.   ?Gastrointestinal:  Positive for diarrhea, nausea and vomiting. Negative for abdominal pain and blood in stool.  ?Genitourinary:  Negative for difficulty urinating and flank pain.  ?Musculoskeletal:  Negative for back pain.  ?Skin:  Negative for pallor and wound.  ?Neurological:  Positive for dizziness.  ?All other systems reviewed and are negative. ? ?Physical Exam ?Updated Vital Signs ?BP 112/73 (BP Location: Right Arm)   Pulse 78   Temp 97.8 ?F (36.6 ?C) (Oral)   Resp 17   Ht 5\' 5"  (1.651 m)   Wt 96.2 kg   SpO2 98%   BMI 35.28 kg/m?  ?Physical Exam ?Vitals and nursing note reviewed.  ?Constitutional:   ?   General:  She is not in acute distress. ?   Appearance: She is well-developed.  ?HENT:  ?   Head: Normocephalic and atraumatic.  ?   Mouth/Throat:  ?   Pharynx: No oropharyngeal exudate.  ?Eyes:  ?   Pupils: Pupils are equal, round, and reactive to light.  ?Cardiovascular:  ?   Rate and Rhythm: Regular rhythm.  ?   Heart sounds: Normal heart sounds.  ?Pulmonary:  ?   Effort: Pulmonary effort is normal. No respiratory distress.  ?   Breath sounds: Normal breath sounds.  ?Abdominal:  ?   General: Bowel sounds are normal. There is no distension.  ?   Palpations: Abdomen is soft.  ?   Tenderness: There is no abdominal tenderness.  ?Musculoskeletal:     ?   General: No tenderness or deformity.  ?   Cervical back: Normal range of motion.  ?   Right lower leg: No edema.  ?   Left lower leg: No edema.  ?Skin: ?   General: Skin is warm and dry.  ?Neurological:  ?   Mental Status: She is alert and oriented to person, place, and time.  ? ? ?ED Results / Procedures / Treatments   ?Labs ?(all labs ordered are listed, but only abnormal results are displayed) ?Labs Reviewed  ?BASIC METABOLIC PANEL - Abnormal; Notable for the following components:  ?    Result Value  ? Potassium 2.9 (*)   ? Chloride 96 (*)   ? Glucose, Bld 220 (*)   ?  Calcium 10.6 (*)   ? Anion gap 16 (*)   ? All other components within normal limits  ?CBC - Abnormal; Notable for the following components:  ? WBC 27.0 (*)   ? RBC 6.04 (*)   ? Hemoglobin 16.9 (*)   ? HCT 51.8 (*)

## 2021-07-29 NOTE — ED Notes (Signed)
Patient transported to CT 

## 2021-07-29 NOTE — Discharge Instructions (Addendum)
Your laboratory results today were within normal limits aside from your elevated white blood cell count, this was followed with the CT which was overall reassuring and did not show any acute abnormality  ? ?I have prescribed a short course of potassium supplementation, please take 1 tablet daily for the next 7 days, you will need to have your potassium rechecked by your primary care physician approximately 1 week after completing this medication. Take Zofran as needed for nausea.  ? ?Please follow-up with your primary care doctor to discuss possibly decreasing her Ozempic dose or discontinuing this medication overall.  Return to the ER for any new or worsening symptoms. ?

## 2021-07-30 ENCOUNTER — Other Ambulatory Visit (HOSPITAL_COMMUNITY): Payer: Self-pay

## 2021-07-30 ENCOUNTER — Other Ambulatory Visit: Payer: Self-pay

## 2021-07-30 MED ORDER — ONDANSETRON 4 MG PO TBDP
4.0000 mg | ORAL_TABLET | Freq: Three times a day (TID) | ORAL | 0 refills | Status: DC | PRN
  Filled 2021-07-30: qty 20, 7d supply, fill #0

## 2021-07-30 NOTE — ED Provider Notes (Signed)
Care of the patient received from [facility]. Please see her note for full HPI.  ? ?In short, 51 year old female w/ DMTII, HTN, GERD  on Ozempic presenting w/ nausea, vomiting and diarrhea. She has been having episodes where she breaks into a sweat, vomits and has diarrhea. Had an episode in the waiting room.  ? ? Workup in the ED w/  a leukocytosis of 27, at least partially hemo-concentrated given elevated Hgb. K+ 2.9, repleted. Received 1L NS. UA w/ evidence of dehydration, no UTI. Lipase normal.  ? ?Care received pending review of CT abdomen pelvis and PO challenge.  ? ?12:23AM: Personally reviewed CT imaging, agree with radiology review, negative for intra-abdominal process. Clustered nodularity in the medial aspect of the left lower lobe not significantly changed from prior  ?Patient PO challenged with no difficulty.  ? ?Suspect symptoms 2/2 Ozempic. No evidence of other infectious etiology (vitals stable, UA normal), will forego antibiotic treatment at this time. Recommended follow-up w/ PCP for possible decrease or discontinuation of Ozempic. Return precautions discussed, stable for discharge ? ?Case discussed w/ Dr. Wilkie Aye who is agreeable to the above plan and disposition  ? ?Results for orders placed or performed during the hospital encounter of 07/29/21  ?Basic metabolic panel  ?Result Value Ref Range  ? Sodium 136 135 - 145 mmol/L  ? Potassium 2.9 (L) 3.5 - 5.1 mmol/L  ? Chloride 96 (L) 98 - 111 mmol/L  ? CO2 24 22 - 32 mmol/L  ? Glucose, Bld 220 (H) 70 - 99 mg/dL  ? BUN 14 6 - 20 mg/dL  ? Creatinine, Ser 0.79 0.44 - 1.00 mg/dL  ? Calcium 10.6 (H) 8.9 - 10.3 mg/dL  ? GFR, Estimated >60 >60 mL/min  ? Anion gap 16 (H) 5 - 15  ?CBC  ?Result Value Ref Range  ? WBC 27.0 (H) 4.0 - 10.5 K/uL  ? RBC 6.04 (H) 3.87 - 5.11 MIL/uL  ? Hemoglobin 16.9 (H) 12.0 - 15.0 g/dL  ? HCT 51.8 (H) 36.0 - 46.0 %  ? MCV 85.8 80.0 - 100.0 fL  ? MCH 28.0 26.0 - 34.0 pg  ? MCHC 32.6 30.0 - 36.0 g/dL  ? RDW 12.5 11.5 - 15.5 %  ?  Platelets 385 150 - 400 K/uL  ? nRBC 0.0 0.0 - 0.2 %  ?Urinalysis, Routine w reflex microscopic  ?Result Value Ref Range  ? Color, Urine YELLOW YELLOW  ? APPearance HAZY (A) CLEAR  ? Specific Gravity, Urine 1.036 (H) 1.005 - 1.030  ? pH 5.0 5.0 - 8.0  ? Glucose, UA >=500 (A) NEGATIVE mg/dL  ? Hgb urine dipstick NEGATIVE NEGATIVE  ? Bilirubin Urine NEGATIVE NEGATIVE  ? Ketones, ur 80 (A) NEGATIVE mg/dL  ? Protein, ur NEGATIVE NEGATIVE mg/dL  ? Nitrite NEGATIVE NEGATIVE  ? Leukocytes,Ua NEGATIVE NEGATIVE  ? RBC / HPF 0-5 0 - 5 RBC/hpf  ? WBC, UA 0-5 0 - 5 WBC/hpf  ? Bacteria, UA NONE SEEN NONE SEEN  ? Squamous Epithelial / LPF 0-5 0 - 5  ? Mucus PRESENT   ?Lipase, blood  ?Result Value Ref Range  ? Lipase 38 11 - 51 U/L  ?CBG monitoring, ED  ?Result Value Ref Range  ? Glucose-Capillary 250 (H) 70 - 99 mg/dL  ?I-Stat beta hCG blood, ED  ?Result Value Ref Range  ? I-stat hCG, quantitative <5.0 <5 mIU/mL  ? Comment 3          ? ?CT ABDOMEN PELVIS W CONTRAST ? ?  Result Date: 07/30/2021 ?CLINICAL DATA:  Abdominal pain, nausea, vomiting. EXAM: CT ABDOMEN AND PELVIS WITH CONTRAST TECHNIQUE: Multidetector CT imaging of the abdomen and pelvis was performed using the standard protocol following bolus administration of intravenous contrast. RADIATION DOSE REDUCTION: This exam was performed according to the departmental dose-optimization program which includes automated exposure control, adjustment of the mA and/or kV according to patient size and/or use of iterative reconstruction technique. CONTRAST:  OMNIPAQUE IOHEXOL 300 MG/ML  SOLN COMPARISON:  01/03/2015. FINDINGS: Lower chest: Clustered nodular opacities are noted in the medial aspect of the left lower lobe, unchanged from 2016. Hepatobiliary: No focal liver abnormality is seen. There is fatty infiltration of the liver status post cholecystectomy. No biliary dilatation. Pancreas: Unremarkable. No pancreatic ductal dilatation or surrounding inflammatory changes. Spleen:  The spleen is normal in size and multiple calcified granuloma are noted. Adrenals/Urinary Tract: No adrenal nodule or mass. No renal calculus or hydronephrosis bilaterally. The kidneys enhance symmetrically. A subcentimeter hypodensity is present in the lower pole of the left kidney which is too small to further characterize. The bladder is unremarkable. Stomach/Bowel: Stomach is within normal limits. Appendix appears normal. No evidence of bowel wall thickening, distention, or inflammatory changes. Vascular/Lymphatic: No significant vascular findings are present. No enlarged abdominal or pelvic lymph nodes. Reproductive: An IUD is present in the uterus. There is a cystic structure with a hyperdense rim in the left ovary measuring 1.8 cm, likely corpus luteal or hemorrhagic cyst. Other: No abdominal wall hernia or abnormality. No abdominopelvic ascites. Musculoskeletal: Degenerative changes are present in the thoracolumbar spine. No acute osseous abnormality. IMPRESSION: 1. No acute intra-abdominal process. No bowel obstruction or free air. 2. Clustered nodularity in the medial aspect of the left lower lobe, not significantly changed from 2016. 3. Hepatic steatosis. Electronically Signed   By: Thornell Sartorius M.D.   On: 07/30/2021 00:13   ? ? ?  ?Mare Ferrari, PA-C ?07/30/21 0040 ? ?  ?Shon Baton, MD ?07/30/21 [phone] ? ?

## 2021-07-30 NOTE — ED Notes (Signed)
All discharge instructions reviewed with patient including follow up care and prescriptions. Patient verbalized understanding of same and had no other questions. Patient stable and ambulatory at time of discharge.  

## 2021-08-13 ENCOUNTER — Other Ambulatory Visit (HOSPITAL_COMMUNITY): Payer: Self-pay

## 2021-08-13 ENCOUNTER — Other Ambulatory Visit: Payer: Self-pay

## 2021-08-13 ENCOUNTER — Encounter: Payer: Self-pay | Admitting: Internal Medicine

## 2021-08-13 ENCOUNTER — Ambulatory Visit (INDEPENDENT_AMBULATORY_CARE_PROVIDER_SITE_OTHER): Payer: No Typology Code available for payment source | Admitting: Internal Medicine

## 2021-08-13 VITALS — BP 91/63 | HR 65 | Temp 98.1°F | Ht 65.0 in | Wt 217.5 lb

## 2021-08-13 DIAGNOSIS — D72829 Elevated white blood cell count, unspecified: Secondary | ICD-10-CM | POA: Diagnosis not present

## 2021-08-13 DIAGNOSIS — I9589 Other hypotension: Secondary | ICD-10-CM | POA: Diagnosis not present

## 2021-08-13 DIAGNOSIS — Z794 Long term (current) use of insulin: Secondary | ICD-10-CM

## 2021-08-13 DIAGNOSIS — E876 Hypokalemia: Secondary | ICD-10-CM

## 2021-08-13 DIAGNOSIS — E1142 Type 2 diabetes mellitus with diabetic polyneuropathy: Secondary | ICD-10-CM

## 2021-08-13 DIAGNOSIS — E861 Hypovolemia: Secondary | ICD-10-CM

## 2021-08-13 DIAGNOSIS — D729 Disorder of white blood cells, unspecified: Secondary | ICD-10-CM

## 2021-08-13 NOTE — Patient Instructions (Addendum)
Thank you for trusting me with your care. To recap, today we discussed the following:   Hypotension - Hold Benicar HCTZ, increase fluid intake. You can restart in 5 days of good oral intake and if your blood pressure is elevated.  Type 2 Diabetes - Lets start Mounjaro once you are feeling better.   Check BMP and CBC today

## 2021-08-13 NOTE — Progress Notes (Unsigned)
   CC: Hypotension, Type 2 Diabetes Mellitus , hypokalemia, and neutrophilia    HPI:Kelly Gallagher is a 51 y.o. female who presents for evaluation of hypotension, Type 2 Diabetes Mellitus , hypokalemia, and neutrophilia . Please see individual problem based A/P for details.   Past Medical History:  Diagnosis Date   Allergy 12/30/2020   seasonal   Anxiety 12/30/2020   Diabetes mellitus without complication (HCC)    GERD (gastroesophageal reflux disease)    Hypertension    Review of Systems:   Review of Systems  Constitutional:  Negative for chills and fever.  Musculoskeletal:  Negative for falls.  Neurological:  Negative for loss of consciousness.       Light headed    Physical Exam: Vitals:   08/13/21 1424  BP: 91/63  Pulse: 65  Temp: 98.1 F (36.7 C)  TempSrc: Oral  SpO2: 98%  Weight: 217 lb 8 oz (98.7 kg)  Height: 5\' 5"  (1.651 m)     Physical Exam Constitutional:      General: She is not in acute distress.    Appearance: She is not ill-appearing.  HENT:     Mouth/Throat:     Mouth: Mucous membranes are dry.  Eyes:     General: No scleral icterus. Cardiovascular:     Rate and Rhythm: Normal rate and regular rhythm.  Pulmonary:     Effort: Pulmonary effort is normal.     Breath sounds: Normal breath sounds.  Skin:    General: Skin is warm and dry.  Neurological:     Mental Status: She is alert.     Assessment & Plan:   Hypotension Patient with history of hypotension. On Benicar and HCTZ. Also on Metoprolol.  BP today 91/63. She was in ED 14 days ago for severe N/V related to Ozempic. This has improved. She feels mild light headed feeling with standing , but does not have difficulty standing. She is tolerating PO intake now. She is going to hold Benicar and HCTZ, increase PO intake. She check BP at home and will restart medication if SBP >130 in 5 days.   Type 2 diabetes mellitus, with long-term current use of insulin (HCC) Patient had return of N/V  while taking Ozempic and acquired her to be taken to ED. She has stopped medication and symptoms are resolved. She would like to start Tirzepatide ,  but we discussed waiting until her hypotension improves. Hypotension I suspect is from hypovolemia  - Follow up in 2 weeks   Neutrophilia Patient had increased neutrophilia, up to 27k. Review of labs and chart show this was coordinated with acute illness. Improved on recheck today, but borderline elevated neutrophilia persist. Patient is going to follow up in 2 weeks and I recommend repeat labs at this time. I expect it to be improve,but if not further workup can be pursued then.   Hypokalemia Hypokalemia in setting of severe vomiting on Ozempic. Wnl today.   Hypercalcemia Patient on HCTZ and shows signs of dehydration in setting of recent vomiting from Ozempic. Labs show borderline hypercalcemia. Has tolerated medcation before illness. She is holding in setting of hypotension and will restart in a 5 days if BP improved. Labs can be checked at follow up and decision made on continuing HCTZ.    Patient discussed with Dr. 12-05-1987

## 2021-08-14 ENCOUNTER — Encounter: Payer: Self-pay | Admitting: Internal Medicine

## 2021-08-14 DIAGNOSIS — E876 Hypokalemia: Secondary | ICD-10-CM | POA: Insufficient documentation

## 2021-08-14 DIAGNOSIS — D729 Disorder of white blood cells, unspecified: Secondary | ICD-10-CM | POA: Insufficient documentation

## 2021-08-14 DIAGNOSIS — I959 Hypotension, unspecified: Secondary | ICD-10-CM | POA: Insufficient documentation

## 2021-08-14 LAB — BMP8+ANION GAP
Anion Gap: 19 mmol/L — ABNORMAL HIGH (ref 10.0–18.0)
BUN/Creatinine Ratio: 16 (ref 9–23)
BUN: 9 mg/dL (ref 6–24)
CO2: 23 mmol/L (ref 20–29)
Calcium: 10.4 mg/dL — ABNORMAL HIGH (ref 8.7–10.2)
Chloride: 97 mmol/L (ref 96–106)
Creatinine, Ser: 0.56 mg/dL — ABNORMAL LOW (ref 0.57–1.00)
Glucose: 142 mg/dL — ABNORMAL HIGH (ref 70–99)
Potassium: 4.3 mmol/L (ref 3.5–5.2)
Sodium: 139 mmol/L (ref 134–144)
eGFR: 112 mL/min/{1.73_m2} (ref >59)

## 2021-08-14 LAB — CBC WITH DIFFERENTIAL/PLATELET
Basophils Absolute: 0.1 10*3/uL (ref 0.0–0.2)
Basos: 1 %
EOS (ABSOLUTE): 0.2 10*3/uL (ref 0.0–0.4)
Eos: 2 %
Hematocrit: 44.4 % (ref 34.0–46.6)
Hemoglobin: 14.4 g/dL (ref 11.1–15.9)
Immature Grans (Abs): 0 10*3/uL (ref 0.0–0.1)
Immature Granulocytes: 0 %
Lymphocytes Absolute: 3.1 10*3/uL (ref 0.7–3.1)
Lymphs: 25 %
MCH: 27.5 pg (ref 26.6–33.0)
MCHC: 32.4 g/dL (ref 31.5–35.7)
MCV: 85 fL (ref 79–97)
Monocytes Absolute: 0.6 10*3/uL (ref 0.1–0.9)
Monocytes: 5 %
Neutrophils Absolute: 8.2 10*3/uL — ABNORMAL HIGH (ref 1.4–7.0)
Neutrophils: 67 %
Platelets: 367 10*3/uL (ref 150–450)
RBC: 5.23 x10E6/uL (ref 3.77–5.28)
RDW: 12.7 % (ref 11.7–15.4)
WBC: 12.2 10*3/uL — ABNORMAL HIGH (ref 3.4–10.8)

## 2021-08-14 NOTE — Assessment & Plan Note (Signed)
Patient had increased neutrophilia, up to 27k. Review of labs and chart show this was coordinated with acute illness. Improved on recheck today, but borderline elevated neutrophilia persist. Patient is going to follow up in 2 weeks and I recommend repeat labs at this time. I expect it to be improve,but if not further workup can be pursued then.

## 2021-08-14 NOTE — Assessment & Plan Note (Signed)
Patient on HCTZ and shows signs of dehydration in setting of recent vomiting from Ozempic. Labs show borderline hypercalcemia. Has tolerated medcation before illness. She is holding in setting of hypotension and will restart in a 5 days if BP improved. Labs can be checked at follow up and decision made on continuing HCTZ.

## 2021-08-14 NOTE — Assessment & Plan Note (Addendum)
Patient had return of N/V while taking Ozempic and acquired her to be taken to ED. She has stopped medication and symptoms are resolved. She would like to start Tirzepatide ,  but we discussed waiting until her hypotension improves. Hypotension I suspect is from hypovolemia  - Follow up in 2 weeks

## 2021-08-14 NOTE — Assessment & Plan Note (Addendum)
Patient with history of hypotension. On Benicar and HCTZ. Also on Metoprolol.  BP today 91/63. She was in ED 14 days ago for severe N/V related to Ozempic. This has improved. She feels mild light headed feeling with standing , but does not have difficulty standing. She is tolerating PO intake now. She is going to hold Benicar and HCTZ, increase PO intake. She check BP at home and will restart medication if SBP >130 in 5 days.

## 2021-08-14 NOTE — Progress Notes (Signed)
Internal Medicine Clinic Attending  Case discussed with Dr. Steen  At the time of the visit.  We reviewed the resident's history and exam and pertinent patient test results.  I agree with the assessment, diagnosis, and plan of care documented in the resident's note.  

## 2021-08-14 NOTE — Assessment & Plan Note (Signed)
Hypokalemia in setting of severe vomiting on Ozempic. Wnl today.

## 2021-08-19 NOTE — Addendum Note (Signed)
Addended by: Erlinda Hong T on: 08/19/2021 04:16 PM   Modules accepted: Orders

## 2021-08-20 ENCOUNTER — Ambulatory Visit (INDEPENDENT_AMBULATORY_CARE_PROVIDER_SITE_OTHER): Payer: No Typology Code available for payment source | Admitting: Obstetrics & Gynecology

## 2021-08-20 ENCOUNTER — Other Ambulatory Visit (HOSPITAL_COMMUNITY)
Admission: RE | Admit: 2021-08-20 | Discharge: 2021-08-20 | Disposition: A | Discharge status: Home / Self Care | Payer: No Typology Code available for payment source | Source: Ambulatory Visit | Attending: Obstetrics & Gynecology | Admitting: Obstetrics & Gynecology

## 2021-08-20 ENCOUNTER — Encounter: Payer: Self-pay | Admitting: Obstetrics & Gynecology

## 2021-08-20 VITALS — BP 107/64 | HR 56 | Ht 65.0 in | Wt 227.0 lb

## 2021-08-20 DIAGNOSIS — Z1231 Encounter for screening mammogram for malignant neoplasm of breast: Secondary | ICD-10-CM | POA: Diagnosis not present

## 2021-08-20 DIAGNOSIS — Z01419 Encounter for gynecological examination (general) (routine) without abnormal findings: Secondary | ICD-10-CM | POA: Insufficient documentation

## 2021-08-20 NOTE — Progress Notes (Signed)
Subjective:     Kelly Gallagher is a 51 y.o. female here for a routine exam.  Current complaints: none current. Had yeast in fxn : glabrata recently. No sx at present. Has rxn to Ozembic. Had to stop it.  Current HgbA1c is WNL.    Gynecologic History No LMP recorded. (Menstrual status: IUD). Contraception: IUD Last Pap: 08/24/2018. Results were: normal Last mammogram: 11/18/2020. Results were: normal  Obstetric History OB History  Gravida Para Term Preterm AB Living  1       1    SAB IAB Ectopic Multiple Live Births  1            # Outcome Date GA Lbr Len/2nd Weight Sex Delivery Anes PTL Lv  1 SAB             Obstetric Comments  "chemical pregnancy"    The following portions of the patient's history were reviewed and updated as appropriate: allergies, current medications, past family history, past medical history, past social history, past surgical history, and problem list.  Review of Systems Pertinent items are noted in HPI.    Objective:  BP 107/64   Pulse (!) 56   Ht 5\' 5"  (1.651 m)   Wt 227 lb (103 kg)   BMI 37.77 kg/m  General Appearance:    Alert, cooperative, no distress, appears stated age  Head:    Normocephalic, without obvious abnormality, atraumatic  Eyes:    conjunctiva/corneas clear, EOM's intact, both eyes  Ears:    Normal external ear canals, both ears  Nose:   Nares normal, septum midline, mucosa normal, no drainage    or sinus tenderness  Throat:   Lips, mucosa, and tongue normal; teeth and gums normal  Neck:   Supple, symmetrical, trachea midline, no adenopathy;    thyroid:  no enlargement/tenderness/nodules  Back:     Symmetric, no curvature, ROM normal, no CVA tenderness  Lungs:     respirations unlabored  Chest Wall:    No tenderness or deformity   Heart:    Regular rate and rhythm  Breast Exam:    No tenderness, masses, or nipple abnormality  Abdomen:     Soft, non-tender, bowel sounds active all four quadrants,    no masses, no organomegaly   Genitalia:    Normal female without lesion, discharge or tenderness     Extremities:   Extremities normal, atraumatic, no cyanosis or edema  Pulses:   2+ and symmetric all extremities  Skin:   Skin color, texture, turgor normal, no rashes or lesions      Assessment:    Healthy female exam.  H/o freq yeast infxn after Ozembic   Plan:   Diagnoses and all orders for this visit:  Well female exam with routine gynecological exam -     Cytology - PAP( [place])  Breast cancer screening by mammogram -     MM 3D SCREEN BREAST BILATERAL; Future   F/u in 1 year or sooner prn   Sejla Marzano L. Harraway-Smith, M.D., 

## 2021-08-21 LAB — CYTOLOGY - PAP
Comment: NEGATIVE
Diagnosis: NEGATIVE
High risk HPV: NEGATIVE

## 2021-09-01 ENCOUNTER — Other Ambulatory Visit (HOSPITAL_COMMUNITY): Payer: Self-pay

## 2021-09-03 ENCOUNTER — Encounter: Payer: Self-pay | Admitting: Obstetrics & Gynecology

## 2021-09-03 ENCOUNTER — Other Ambulatory Visit (HOSPITAL_BASED_OUTPATIENT_CLINIC_OR_DEPARTMENT_OTHER): Payer: Self-pay

## 2021-09-03 ENCOUNTER — Ambulatory Visit: Payer: No Typology Code available for payment source | Admitting: Obstetrics & Gynecology

## 2021-09-03 VITALS — BP 115/72 | HR 66 | Ht 65.0 in | Wt 224.0 lb

## 2021-09-03 DIAGNOSIS — L739 Follicular disorder, unspecified: Secondary | ICD-10-CM

## 2021-09-03 MED ORDER — SULFAMETHOXAZOLE-TRIMETHOPRIM 800-160 MG PO TABS
1.0000 | ORAL_TABLET | Freq: Two times a day (BID) | ORAL | 1 refills | Status: DC
Start: 2021-09-03 — End: 2022-03-05
  Filled 2021-09-03: qty 14, 7d supply, fill #0
  Filled 2021-09-08: qty 14, 7d supply, fill #1

## 2021-09-03 NOTE — Progress Notes (Signed)
Patient having some spot that she noticed six days ago. Having itching, burning and pain in her vaginal area. Kathrene Alu RN

## 2021-09-03 NOTE — Progress Notes (Signed)
History:  51 y.o. G1P0010 here today for eval of bumps on mons pubis. She has had these in the past. But, they were not present at her last visit.  Her Glc is well controlled but, she does have DM. She denies fever or chills etc.   The following portions of the patient's history were reviewed and updated as appropriate: allergies, current medications, past family history, past medical history, past social history, past surgical history and problem list.  Review of Systems:  Pertinent items are noted in HPI.    Objective:  Physical Exam Blood pressure 115/72, pulse 66, height 5\' 5"  (1.651 m), weight 224 lb (101.6 kg). BP 115/72   Pulse 66   Ht 5\' 5"  (1.651 m)   Wt 224 lb (101.6 kg)   BMI 37.28 kg/m   CONSTITUTIONAL: Well-developed, well-nourished female in no acute distress.  HENT:  Normocephalic, atraumatic EYES: Conjunctivae and EOM are normal. No scleral icterus.  NECK: Normal range of motion SKIN: Skin is warm and dry. No rash noted. Not diaphoretic.No pallor. NEUROLGIC: Alert and oriented to person, place, and time. Normal coordination.  Pelvic: has small indurated area (3cm) with NO fluctuance. The area is sl warm to touch. Not acutely tender. There is also a very small comedone in the left upper thigh fold. The external genitalia is o/w normal appearing.   A/P  Diagnoses and all orders for this visit:  Folliculitis of perineum  Other orders -     sulfamethoxazole-trimethoprim (BACTRIM DS) 800-160 MG tablet; Take 1 tablet by mouth 2 (two) times daily.  Pt ot use warm compresses on the area as needed.    F/u in 1 week or sooner prn fever, chills, enlarging lesion.   Zada Haser L. Harraway-Smith, M.D., 

## 2021-09-08 ENCOUNTER — Other Ambulatory Visit (HOSPITAL_COMMUNITY): Payer: Self-pay

## 2021-09-08 ENCOUNTER — Other Ambulatory Visit: Payer: Self-pay | Admitting: Internal Medicine

## 2021-09-08 DIAGNOSIS — E1142 Type 2 diabetes mellitus with diabetic polyneuropathy: Secondary | ICD-10-CM

## 2021-09-09 ENCOUNTER — Other Ambulatory Visit (HOSPITAL_COMMUNITY): Payer: Self-pay

## 2021-09-09 MED ORDER — METFORMIN HCL 1000 MG PO TABS
1000.0000 mg | ORAL_TABLET | Freq: Two times a day (BID) | ORAL | 1 refills | Status: DC
  Filled 2021-09-09 – 2021-09-18 (×2): qty 90, 45d supply, fill #0
  Filled 2021-11-17: qty 90, 45d supply, fill #1

## 2021-09-09 MED ORDER — DULOXETINE HCL 60 MG PO CPEP
ORAL_CAPSULE | Freq: Every day | ORAL | 5 refills | Status: DC
  Filled 2021-09-09: qty 30, fill #0
  Filled 2021-09-18: qty 30, 30d supply, fill #0
  Filled 2021-10-29: qty 23, 23d supply, fill #1
  Filled 2021-10-29: qty 7, 7d supply, fill #1
  Filled 2021-11-24: qty 30, 30d supply, fill #2
  Filled 2021-12-26: qty 30, 30d supply, fill #3
  Filled 2022-01-19: qty 30, 30d supply, fill #4
  Filled 2022-02-18: qty 30, 30d supply, fill #5

## 2021-09-10 ENCOUNTER — Ambulatory Visit: Payer: No Typology Code available for payment source | Admitting: Obstetrics & Gynecology

## 2021-09-11 ENCOUNTER — Encounter: Payer: Self-pay | Admitting: Internal Medicine

## 2021-09-15 ENCOUNTER — Encounter: Payer: Self-pay | Admitting: Internal Medicine

## 2021-09-16 ENCOUNTER — Other Ambulatory Visit (HOSPITAL_COMMUNITY): Payer: Self-pay

## 2021-09-16 MED ORDER — MOUNJARO 2.5 MG/0.5ML ~~LOC~~ SOAJ
2.5000 mg | SUBCUTANEOUS | 0 refills | Status: DC
  Filled 2021-09-16: qty 2, 28d supply, fill #0

## 2021-09-18 ENCOUNTER — Other Ambulatory Visit (HOSPITAL_COMMUNITY): Payer: Self-pay

## 2021-09-25 ENCOUNTER — Other Ambulatory Visit (HOSPITAL_COMMUNITY): Payer: Self-pay

## 2021-10-02 ENCOUNTER — Other Ambulatory Visit (HOSPITAL_COMMUNITY): Payer: Self-pay

## 2021-10-03 ENCOUNTER — Other Ambulatory Visit (HOSPITAL_COMMUNITY): Payer: Self-pay

## 2021-10-06 ENCOUNTER — Other Ambulatory Visit: Payer: Self-pay | Admitting: Student

## 2021-10-06 ENCOUNTER — Other Ambulatory Visit (HOSPITAL_COMMUNITY): Payer: Self-pay

## 2021-10-06 DIAGNOSIS — E1142 Type 2 diabetes mellitus with diabetic polyneuropathy: Secondary | ICD-10-CM

## 2021-10-06 MED ORDER — MOUNJARO 5 MG/0.5ML ~~LOC~~ SOAJ
5.0000 mg | SUBCUTANEOUS | 2 refills | Status: DC
  Filled 2021-10-06: qty 2, 28d supply, fill #0

## 2021-10-06 NOTE — Progress Notes (Signed)
I called and spoke with the patient.  She states that she is doing well on the current dose of Mounjaro 2.5 mg weekly.  She was on this dose for 3 weeks and has 1 pen left.  Denies any side effects such as nausea and vomiting.  States that her weight remains the same.  I will increase her Mounjaro to 5 mg weekly.  Patient will start her new dose after finishing 4 pens of 2.5 mg.  Patient is advised to make a follow-up appointment for repeat A1c and blood pressure recheck.  She verbalizes agreement with the plan.

## 2021-10-13 ENCOUNTER — Other Ambulatory Visit (HOSPITAL_COMMUNITY): Payer: Self-pay

## 2021-10-29 ENCOUNTER — Other Ambulatory Visit: Payer: Self-pay | Admitting: Student

## 2021-10-29 ENCOUNTER — Other Ambulatory Visit (HOSPITAL_COMMUNITY): Payer: Self-pay

## 2021-10-29 MED ORDER — LEVEMIR FLEXPEN 100 UNIT/ML ~~LOC~~ SOPN
PEN_INJECTOR | SUBCUTANEOUS | 3 refills | Status: DC
  Filled 2021-10-29: qty 30, 30d supply, fill #0

## 2021-11-10 NOTE — Progress Notes (Unsigned)
CC: Diabetes Follow-up  HPI:   [name] is a 51 y.o. with a past medical history of hypertension, diabetes 2, GAD, vitamin B12 deficiency presents for follow-up of diabetes management.  She also has several acute complaints.  She was last seen in clinic May 24.       Past Medical History:  Diagnosis Date   Allergy 12/30/2020   seasonal   Anxiety 12/30/2020   Diabetes mellitus without complication (HCC)    GERD (gastroesophageal reflux disease)    Hypertension      Review of Systems:    Reports wrist and toe pain, breast rash, sinus congestion Denies fever, nausea, vomiting, diarrhea, chills, shortness of breath   Physical Exam:  Vitals:   11/11/21 1051  BP: (!) 101/55  Pulse: 63  Temp: 98.1 F (36.7 C)  TempSrc: Oral  SpO2: 100%  Weight: 221 lb 9.6 oz (100.5 kg)    General:   awake and alert, sitting comfortably in chair, cooperative, not in acute distress Skin:   erythematous macule about 10cm in length with satellite lesions present in the skin folds below left breast; no ulcerations, discharge, or open wounds Ears:   pinnae normal, no discharge or external lesions, tympanic membranes nonbulging and nonerythematous Lungs:   normal respiratory effort, breathing unlabored, symmetrical chest rise, no crackles or wheezing Cardiac:   regular rate and rhythm, normal S1 and S2 Musculoskeletal:   right wrist swollen compared to the left, focal tenderness at ulnar-lunate joint, range of motion in right wrist with both flexion and extension limited by pain; grip strength 4+/5 on right; no overlying erythema or lesions; mild ecchymosis and tenderness to palpation present on right hallux, range of motion of hallux also limited by pain Psychiatric:   mood and affect normal, intelligible speech    Assessment & Plan:   Hypertension associated with diabetes (HCC) Patient has a history of hypertension, but most of her recent blood pressure readings have been low.  She  is managed with metoprolol and olmesartan-hydrochlorothiazide.  Measures her blood pressure at home regularly, her readings are consistently in the 110-120s/60-70s range.  If her blood pressure is low, then she will hold olmesartan-hydrochlorothiazide the next day.  She takes metoprolol regardless of blood pressure readings.  Reports occasional lightheadedness but denies any blurry vision or syncopal episodes.  In clinic her blood pressure was 101/55.  -Continue metoprolol 100 mg daily and olmesartan-hydrochlorothiazide 20-12.5 mg daily   Wrist pain, acute, right Patient reports acute right wrist and foot pain after sustaining a fall yesterday 8-21 at her community pool. The fall was mechanical, she slipped and fell thinking that the surface she was stepping on was cement but it was actually vinyl.  Her wrist throbbing last night and disrupting her sleep.  It since stopped throbbing and now constantly aches.  She describes the pain as achy and intense, and any movement will exacerbate it, particularly with forearm rotation.  She denies tingling, numbness, and any discoloration of fingers.  She has not taken any medications for it and has been wearing a splint since the fall.  On exam, the right wrist is swollen compared to the left.  There is focal tenderness at the ulnar-lunate joint.  Range of motion in the right wrist with both flexion and extension is limited by pain.  Pain is exacerbated with internal and external rotation of the right forearm.  Grip strength is 4+/5 on the right, limited by pain.  There was no overlying erythema  or lesions.  Mild ecchymosis and tenderness to palpation present on the right hallux. Range of motion of the hallux was also limited by pain.  The focal tenderness on both the right hallux and wrist, radiograph of both anatomical areas is warranted to rule out fracture.  -Order radiograph of the right wrist and right foot   Intertrigo Patient noticed a wound to her left  breast about 4 days ago when bathing.  He has tried Neosporin, hydrocortisone, clotrimazole, and is covered in a bandage.  Despite these interventions, the wound has worsened.  On exam, erythematous macule about 10cm in length with satellite lesions was present in the skin folds below the left breast.  No ulcerations, discharge, or open wounds.  Findings are consistent with intertrigo.  -Prescribe nystatin powder return to clinic if symptoms persist despite treatment   Type 2 diabetes mellitus, with long-term current use of insulin (HCC) Has a longstanding history of diabetes.  Her last A1c was 7.5 in April 2023.  Currently on empagliflozin, insulin detemir, metformin, and tirzepatide.  Previously on Ozempic, but had severe emesis and has since switched to tirzepatide.  She just finished week #4 of Ozempic at this is 5.0 mg dose.  She has been tolerating this medication well.  Takes insulin detemir, specifically 60 units in the morning and 50 units in the evening.  A1c today in clinic was 9.5.  -Increase tirzepatide to 7.5 mg weekly for the next 4 weeks ending September 19 and then increase to 10mg  -Increased insulin detemir dose, 70 units in the morning and 60 units in the evening     See Encounters Tab for problem based charting.  Patient seen with Dr. 

## 2021-11-11 ENCOUNTER — Other Ambulatory Visit (HOSPITAL_COMMUNITY): Payer: Self-pay

## 2021-11-11 ENCOUNTER — Ambulatory Visit (INDEPENDENT_AMBULATORY_CARE_PROVIDER_SITE_OTHER): Payer: No Typology Code available for payment source | Admitting: Student

## 2021-11-11 ENCOUNTER — Ambulatory Visit (HOSPITAL_COMMUNITY)
Admission: RE | Admit: 2021-11-11 | Discharge: 2021-11-11 | Disposition: A | Discharge status: Home / Self Care | Payer: No Typology Code available for payment source | Source: Ambulatory Visit | Attending: Student in an Organized Health Care Education/Training Program | Admitting: Student in an Organized Health Care Education/Training Program

## 2021-11-11 VITALS — BP 101/55 | HR 63 | Temp 98.1°F | Wt 221.6 lb

## 2021-11-11 DIAGNOSIS — E1159 Type 2 diabetes mellitus with other circulatory complications: Secondary | ICD-10-CM

## 2021-11-11 DIAGNOSIS — Z794 Long term (current) use of insulin: Secondary | ICD-10-CM

## 2021-11-11 DIAGNOSIS — M79674 Pain in right toe(s): Secondary | ICD-10-CM | POA: Insufficient documentation

## 2021-11-11 DIAGNOSIS — M25531 Pain in right wrist: Secondary | ICD-10-CM | POA: Diagnosis present

## 2021-11-11 DIAGNOSIS — E1142 Type 2 diabetes mellitus with diabetic polyneuropathy: Secondary | ICD-10-CM | POA: Diagnosis not present

## 2021-11-11 DIAGNOSIS — I152 Hypertension secondary to endocrine disorders: Secondary | ICD-10-CM

## 2021-11-11 DIAGNOSIS — J069 Acute upper respiratory infection, unspecified: Secondary | ICD-10-CM | POA: Insufficient documentation

## 2021-11-11 DIAGNOSIS — Z9181 History of falling: Secondary | ICD-10-CM

## 2021-11-11 DIAGNOSIS — L304 Erythema intertrigo: Secondary | ICD-10-CM | POA: Diagnosis not present

## 2021-11-11 DIAGNOSIS — Z7984 Long term (current) use of oral hypoglycemic drugs: Secondary | ICD-10-CM

## 2021-11-11 LAB — POCT GLYCOSYLATED HEMOGLOBIN (HGB A1C): Hemoglobin A1C: 9.5 % — AB (ref 4.0–5.6)

## 2021-11-11 LAB — GLUCOSE, CAPILLARY: Glucose-Capillary: 226 mg/dL — ABNORMAL HIGH (ref 70–99)

## 2021-11-11 MED ORDER — MOUNJARO 7.5 MG/0.5ML ~~LOC~~ SOAJ
7.5000 mg | SUBCUTANEOUS | 2 refills | Status: DC
  Filled 2021-11-11 (×2): qty 2, 28d supply, fill #0

## 2021-11-11 MED ORDER — FLUTICASONE PROPIONATE HFA 44 MCG/ACT IN AERO
1.0000 | INHALATION_SPRAY | Freq: Two times a day (BID) | RESPIRATORY_TRACT | 2 refills | Status: DC
  Filled 2021-11-11 – 2022-03-27 (×2): qty 10.6, 60d supply, fill #0
  Filled 2022-05-07: qty 10.6, 30d supply, fill #1
  Filled 2022-06-01: qty 10.6, 60d supply, fill #1
  Filled 2022-07-28: qty 10.6, 60d supply, fill #2

## 2021-11-11 MED ORDER — LEVEMIR FLEXPEN 100 UNIT/ML ~~LOC~~ SOPN
PEN_INJECTOR | SUBCUTANEOUS | 3 refills | Status: DC
  Filled 2021-11-11: qty 30, 23d supply, fill #0
  Filled 2021-12-17: qty 30, 23d supply, fill #1
  Filled 2022-01-19: qty 30, 23d supply, fill #2
  Filled 2022-02-18: qty 30, 23d supply, fill #3

## 2021-11-11 MED ORDER — NYSTATIN 100000 UNIT/GM EX POWD
1.0000 | Freq: Three times a day (TID) | CUTANEOUS | 0 refills | Status: DC
  Filled 2021-11-11: qty 15, 5d supply, fill #0

## 2021-11-11 NOTE — Assessment & Plan Note (Signed)
Has a longstanding history of diabetes.  Her last A1c was 7.5 in April 2023.  Currently on empagliflozin, insulin detemir, metformin, and tirzepatide.  Previously on Ozempic, but had severe emesis and has since switched to tirzepatide.  She just finished week #4 of Ozempic at this is 5.0 mg dose.  She has been tolerating this medication well.  Takes insulin detemir, specifically 60 units in the morning and 50 units in the evening.  A1c today in clinic was 9.5.  -Increase tirzepatide to 7.5 mg weekly for the next 4 weeks ending September 19 and then increase to 10mg  -Increased insulin detemir dose, 70 units in the morning and 60 units in the evening

## 2021-11-11 NOTE — Assessment & Plan Note (Signed)
Patient noticed a wound to her left breast about 4 days ago when bathing.  He has tried Neosporin, hydrocortisone, clotrimazole, and is covered in a bandage.  Despite these interventions, the wound has worsened.  On exam, erythematous macule about 10cm in length with satellite lesions was present in the skin folds below the left breast.  No ulcerations, discharge, or open wounds.  Findings are consistent with intertrigo.  -Prescribe nystatin powder return to clinic if symptoms persist despite treatment

## 2021-11-11 NOTE — Assessment & Plan Note (Signed)
Patient has a history of hypertension, but most of her recent blood pressure readings have been low.  She is managed with metoprolol and olmesartan-hydrochlorothiazide.  Measures her blood pressure at home regularly, her readings are consistently in the 110-120s/60-70s range.  If her blood pressure is low, then she will hold olmesartan-hydrochlorothiazide the next day.  She takes metoprolol regardless of blood pressure readings.  Reports occasional lightheadedness but denies any blurry vision or syncopal episodes.  In clinic her blood pressure was 101/55.  -Continue metoprolol 100 mg daily and olmesartan-hydrochlorothiazide 20-12.5 mg daily

## 2021-11-11 NOTE — Patient Instructions (Signed)
  Thank you, Kelly Gallagher, for allowing [place] to provide your care today. Today we discussed . . .  > Hypertension       - please continue your current medication regimen with metoprolol and olmesartan-hydrochlorothiazide > Diabetes       - we are increasing your tirzepatide to 7.5mg  weekly, which we will continue for four weeks > Upper Respiratory Infection       - we have ordered fluticasone, which can help with your congestion, otherwise drink plenty of water and sleep well > Wrist-Toe Pain       - we have ordered you Xrays for both your right wrist and right toe > Breast Rash       - we have prescribed you some topical nystatin for your rash, which should effectively treat it   I have ordered the following labs for you:  Lab Orders         Glucose, capillary         POC Hbg A1C       Tests ordered today:  Xrays of right wrist and right foot   Referrals ordered today:   Referral Orders  No referral(s) requested today      I have ordered the following medication/changed the following medications:   Stop the following medications: Medications Discontinued During This Encounter  Medication Reason   tirzepatide Greggory Keen) 5 MG/0.5ML Pen Reorder     Start the following medications: Meds ordered this encounter  Medications   fluticasone (FLOVENT HFA) 44 MCG/ACT inhaler    Sig: Inhale 1 puff into the lungs 2 (two) times daily.    Dispense:  10.6 g    Refill:  2   nystatin powder    Sig: Apply to affected area 3 (three) times daily.    Dispense:  15 g    Refill:  0   tirzepatide (MOUNJARO) 5 MG/0.5ML Pen    Sig: Inject 7.5 mg into the skin once a week.    Dispense:  2 mL    Refill:  2      Follow up: 3 months    Remember:  Please continue to take your medications, increase the trizepatide to 7.5mg  weekly, and we will see you again in about three months!   Should you have any questions or concerns please call the internal medicine clinic at [phone].      Lajuana Ripple, MD [facility]

## 2021-11-11 NOTE — Assessment & Plan Note (Signed)
Patient reports acute right wrist and foot pain after sustaining a fall yesterday 8-21 at her community pool. The fall was mechanical, she slipped and fell thinking that the surface she was stepping on was cement but it was actually vinyl.  Her wrist throbbing last night and disrupting her sleep.  It since stopped throbbing and now constantly aches.  She describes the pain as achy and intense, and any movement will exacerbate it, particularly with forearm rotation.  She denies tingling, numbness, and any discoloration of fingers.  She has not taken any medications for it and has been wearing a splint since the fall.  On exam, the right wrist is swollen compared to the left.  There is focal tenderness at the ulnar-lunate joint.  Range of motion in the right wrist with both flexion and extension is limited by pain.  Pain is exacerbated with internal and external rotation of the right forearm.  Grip strength is 4+/5 on the right, limited by pain.  There was no overlying erythema or lesions.  Mild ecchymosis and tenderness to palpation present on the right hallux. Range of motion of the hallux was also limited by pain.  The focal tenderness on both the right hallux and wrist, radiograph of both anatomical areas is warranted to rule out fracture.  -Order radiograph of the right wrist and right foot

## 2021-11-12 ENCOUNTER — Other Ambulatory Visit (HOSPITAL_COMMUNITY): Payer: Self-pay

## 2021-11-15 ENCOUNTER — Other Ambulatory Visit (HOSPITAL_COMMUNITY): Payer: Self-pay

## 2021-11-17 ENCOUNTER — Other Ambulatory Visit: Payer: Self-pay | Admitting: Internal Medicine

## 2021-11-17 ENCOUNTER — Other Ambulatory Visit (HOSPITAL_COMMUNITY): Payer: Self-pay

## 2021-11-17 DIAGNOSIS — I10 Essential (primary) hypertension: Secondary | ICD-10-CM

## 2021-11-17 MED ORDER — METOPROLOL SUCCINATE ER 100 MG PO TB24
ORAL_TABLET | ORAL | 3 refills | Status: DC
  Filled 2021-11-17: qty 90, 90d supply, fill #0
  Filled 2022-03-01: qty 90, 90d supply, fill #1
  Filled 2022-06-01: qty 90, 90d supply, fill #2
  Filled 2022-08-24 – 2022-08-25 (×2): qty 90, 90d supply, fill #3

## 2021-11-17 NOTE — Progress Notes (Signed)
Internal Medicine Clinic Attending  I saw and evaluated the patient.  I personally confirmed the key portions of the history and exam documented by Dr. Harper and I reviewed pertinent patient test results.  The assessment, diagnosis, and plan were formulated together and I agree with the documentation in the resident's note.  

## 2021-11-18 ENCOUNTER — Other Ambulatory Visit (HOSPITAL_COMMUNITY): Payer: Self-pay

## 2021-11-24 ENCOUNTER — Other Ambulatory Visit (HOSPITAL_COMMUNITY): Payer: Self-pay

## 2021-11-25 ENCOUNTER — Ambulatory Visit (HOSPITAL_BASED_OUTPATIENT_CLINIC_OR_DEPARTMENT_OTHER)
Admission: RE | Admit: 2021-11-25 | Discharge: 2021-11-25 | Disposition: A | Discharge status: Home / Self Care | Payer: No Typology Code available for payment source | Source: Ambulatory Visit | Attending: Obstetrics & Gynecology | Admitting: Obstetrics & Gynecology

## 2021-11-25 ENCOUNTER — Encounter (HOSPITAL_BASED_OUTPATIENT_CLINIC_OR_DEPARTMENT_OTHER): Payer: Self-pay

## 2021-11-25 ENCOUNTER — Other Ambulatory Visit (HOSPITAL_COMMUNITY): Payer: Self-pay

## 2021-11-25 DIAGNOSIS — Z1231 Encounter for screening mammogram for malignant neoplasm of breast: Secondary | ICD-10-CM | POA: Insufficient documentation

## 2021-11-28 ENCOUNTER — Other Ambulatory Visit (HOSPITAL_COMMUNITY): Payer: Self-pay

## 2021-12-16 ENCOUNTER — Encounter: Payer: Self-pay | Admitting: Student

## 2021-12-16 DIAGNOSIS — E1142 Type 2 diabetes mellitus with diabetic polyneuropathy: Secondary | ICD-10-CM

## 2021-12-17 ENCOUNTER — Other Ambulatory Visit (HOSPITAL_COMMUNITY): Payer: Self-pay

## 2021-12-18 ENCOUNTER — Other Ambulatory Visit (HOSPITAL_COMMUNITY): Payer: Self-pay

## 2021-12-18 MED ORDER — MOUNJARO 10 MG/0.5ML ~~LOC~~ SOAJ
10.0000 mg | SUBCUTANEOUS | 0 refills | Status: DC
  Filled 2021-12-18: qty 2, 28d supply, fill #0

## 2021-12-26 ENCOUNTER — Other Ambulatory Visit: Payer: Self-pay | Admitting: Internal Medicine

## 2021-12-26 ENCOUNTER — Other Ambulatory Visit (HOSPITAL_COMMUNITY): Payer: Self-pay

## 2021-12-26 DIAGNOSIS — E1142 Type 2 diabetes mellitus with diabetic polyneuropathy: Secondary | ICD-10-CM

## 2021-12-26 MED ORDER — METFORMIN HCL 1000 MG PO TABS
1000.0000 mg | ORAL_TABLET | Freq: Two times a day (BID) | ORAL | 3 refills | Status: DC
  Filled 2021-12-26: qty 60, 30d supply, fill #0
  Filled 2022-01-19: qty 60, 30d supply, fill #1
  Filled 2022-03-01: qty 60, 30d supply, fill #2
  Filled 2022-03-30: qty 60, 30d supply, fill #3

## 2021-12-31 ENCOUNTER — Other Ambulatory Visit (HOSPITAL_COMMUNITY): Payer: Self-pay

## 2022-01-01 ENCOUNTER — Other Ambulatory Visit (HOSPITAL_COMMUNITY): Payer: Self-pay

## 2022-01-01 ENCOUNTER — Encounter: Payer: Self-pay | Admitting: Student

## 2022-01-19 ENCOUNTER — Other Ambulatory Visit: Payer: Self-pay | Admitting: Internal Medicine

## 2022-01-19 ENCOUNTER — Encounter: Payer: Self-pay | Admitting: Student

## 2022-01-20 ENCOUNTER — Other Ambulatory Visit (HOSPITAL_COMMUNITY): Payer: Self-pay

## 2022-01-21 ENCOUNTER — Other Ambulatory Visit (HOSPITAL_COMMUNITY): Payer: Self-pay

## 2022-01-21 ENCOUNTER — Other Ambulatory Visit: Payer: Self-pay | Admitting: Student

## 2022-01-21 MED ORDER — EMPAGLIFLOZIN 25 MG PO TABS
25.0000 mg | ORAL_TABLET | Freq: Every day | ORAL | 1 refills | Status: DC
  Filled 2022-01-21: qty 90, 90d supply, fill #0

## 2022-01-21 MED ORDER — TIRZEPATIDE 12.5 MG/0.5ML ~~LOC~~ SOAJ
12.5000 mg | SUBCUTANEOUS | 0 refills | Status: DC
  Filled 2022-01-21: qty 2, 28d supply, fill #0

## 2022-01-21 MED ORDER — MOUNJARO 10 MG/0.5ML ~~LOC~~ SOAJ
12.5000 mg | SUBCUTANEOUS | 0 refills | Status: DC
  Filled 2022-01-21: qty 2, 28d supply, fill #0

## 2022-02-18 ENCOUNTER — Encounter: Payer: Self-pay | Admitting: Student

## 2022-02-18 ENCOUNTER — Other Ambulatory Visit: Payer: Self-pay

## 2022-02-18 ENCOUNTER — Other Ambulatory Visit (HOSPITAL_COMMUNITY): Payer: Self-pay

## 2022-02-19 ENCOUNTER — Other Ambulatory Visit (HOSPITAL_COMMUNITY): Payer: Self-pay

## 2022-02-19 MED ORDER — TIRZEPATIDE 15 MG/0.5ML ~~LOC~~ SOAJ
15.0000 mg | SUBCUTANEOUS | 0 refills | Status: DC
  Filled 2022-02-19: qty 2, 28d supply, fill #0

## 2022-03-01 ENCOUNTER — Encounter: Payer: Self-pay | Admitting: Student

## 2022-03-02 ENCOUNTER — Other Ambulatory Visit: Payer: Self-pay | Admitting: *Deleted

## 2022-03-02 ENCOUNTER — Other Ambulatory Visit: Payer: Self-pay

## 2022-03-02 ENCOUNTER — Other Ambulatory Visit (HOSPITAL_COMMUNITY): Payer: Self-pay

## 2022-03-02 DIAGNOSIS — I10 Essential (primary) hypertension: Secondary | ICD-10-CM

## 2022-03-03 ENCOUNTER — Other Ambulatory Visit: Payer: Self-pay

## 2022-03-03 ENCOUNTER — Other Ambulatory Visit (HOSPITAL_COMMUNITY): Payer: Self-pay

## 2022-03-03 MED ORDER — OLMESARTAN MEDOXOMIL-HCTZ 20-12.5 MG PO TABS
1.0000 | ORAL_TABLET | Freq: Every day | ORAL | 3 refills | Status: DC
  Filled 2022-03-03 – 2022-03-04 (×2): qty 90, 90d supply, fill #0
  Filled 2022-06-01: qty 90, 90d supply, fill #1
  Filled 2022-08-25: qty 90, 90d supply, fill #2
  Filled 2022-11-25: qty 90, 90d supply, fill #3

## 2022-03-04 ENCOUNTER — Other Ambulatory Visit (HOSPITAL_COMMUNITY): Payer: Self-pay

## 2022-03-05 ENCOUNTER — Other Ambulatory Visit (HOSPITAL_COMMUNITY): Payer: Self-pay

## 2022-03-05 ENCOUNTER — Encounter: Payer: Self-pay | Admitting: Student

## 2022-03-05 ENCOUNTER — Ambulatory Visit (INDEPENDENT_AMBULATORY_CARE_PROVIDER_SITE_OTHER): Payer: No Typology Code available for payment source | Admitting: Student

## 2022-03-05 ENCOUNTER — Other Ambulatory Visit: Payer: Self-pay

## 2022-03-05 ENCOUNTER — Other Ambulatory Visit (HOSPITAL_COMMUNITY)
Admission: RE | Admit: 2022-03-05 | Discharge: 2022-03-05 | Disposition: A | Discharge status: Home / Self Care | Payer: No Typology Code available for payment source | Source: Ambulatory Visit | Attending: Internal Medicine | Admitting: Internal Medicine

## 2022-03-05 VITALS — BP 124/74 | HR 72 | Temp 98.3°F | Ht 65.0 in | Wt 218.4 lb

## 2022-03-05 DIAGNOSIS — E1142 Type 2 diabetes mellitus with diabetic polyneuropathy: Secondary | ICD-10-CM | POA: Diagnosis not present

## 2022-03-05 DIAGNOSIS — N898 Other specified noninflammatory disorders of vagina: Secondary | ICD-10-CM | POA: Insufficient documentation

## 2022-03-05 DIAGNOSIS — F411 Generalized anxiety disorder: Secondary | ICD-10-CM

## 2022-03-05 DIAGNOSIS — I152 Hypertension secondary to endocrine disorders: Secondary | ICD-10-CM | POA: Diagnosis not present

## 2022-03-05 DIAGNOSIS — B3731 Acute candidiasis of vulva and vagina: Secondary | ICD-10-CM

## 2022-03-05 DIAGNOSIS — E1159 Type 2 diabetes mellitus with other circulatory complications: Secondary | ICD-10-CM

## 2022-03-05 DIAGNOSIS — Z794 Long term (current) use of insulin: Secondary | ICD-10-CM | POA: Diagnosis not present

## 2022-03-05 LAB — POCT GLYCOSYLATED HEMOGLOBIN (HGB A1C): Hemoglobin A1C: 8.6 % — AB (ref 4.0–5.6)

## 2022-03-05 LAB — GLUCOSE, CAPILLARY: Glucose-Capillary: 181 mg/dL — ABNORMAL HIGH (ref 70–99)

## 2022-03-05 MED ORDER — DULOXETINE HCL 60 MG PO CPEP
60.0000 mg | ORAL_CAPSULE | Freq: Every day | ORAL | 5 refills | Status: DC
  Filled 2022-03-05 (×3): qty 30, 30d supply, fill #0
  Filled 2022-03-30: qty 30, 30d supply, fill #1
  Filled 2022-05-07: qty 30, 30d supply, fill #2
  Filled 2022-06-01: qty 30, 30d supply, fill #3
  Filled 2022-07-01: qty 30, 30d supply, fill #4
  Filled 2022-07-31: qty 30, 30d supply, fill #5

## 2022-03-05 MED ORDER — TIRZEPATIDE 15 MG/0.5ML ~~LOC~~ SOAJ
15.0000 mg | SUBCUTANEOUS | 3 refills | Status: DC
  Filled 2022-03-05 – 2022-03-18 (×2): qty 2, 28d supply, fill #0
  Filled 2022-04-18: qty 2, 28d supply, fill #1
  Filled 2022-05-12: qty 2, 28d supply, fill #2
  Filled 2022-06-25: qty 2, 28d supply, fill #3

## 2022-03-05 NOTE — Patient Instructions (Addendum)
  Thank you, Ms.Khia L Stalling, for allowing [place] to provide your care today. Today we discussed . . .  > Diabetes       -Your A1c is down to 8.6 which is great.  We think you will continue to decrease after starting the Mounjaro at 15 mg weekly.  Please continue to take your insulin detemir 70 units in the morning and 60 units in the evening as well as metformin 1000 mg twice daily.  We are going to stop the Jardiance due to the recurrent infections you are having.  We will have you back again in 3 months to recheck A1c. > Discharge       -We have sent off a lab from the swab we took.  We will call you with the results and let you know what our plan is about sending in medication.   I have ordered the following labs for you:  Lab Orders         BMP8+Anion Gap         Glucose, capillary         POC Hbg A1C        Referrals ordered today:   Referral Orders  No referral(s) requested today      I have ordered the following medication/changed the following medications:   Stop the following medications: Medications Discontinued During This Encounter  Medication Reason   sulfamethoxazole-trimethoprim (BACTRIM DS) 800-160 MG tablet Completed Course   DULoxetine (CYMBALTA) 60 MG capsule Reorder   tirzepatide (MOUNJARO) 15 MG/0.5ML Pen Reorder   empagliflozin (JARDIANCE) 25 MG TABS tablet      Start the following medications: Meds ordered this encounter  Medications   tirzepatide (MOUNJARO) 15 MG/0.5ML Pen    Sig: Inject 15 mg into the skin once a week.    Dispense:  6 mL    Refill:  3   DULoxetine (CYMBALTA) 60 MG capsule    Sig: Take 1 capsule (60 mg total) by mouth daily.    Dispense:  30 capsule    Refill:  5      Follow up: 3 months    Remember:     Should you have any questions or concerns please call the internal medicine clinic at [phone].     Rocky Morel, DO [facility]

## 2022-03-05 NOTE — Progress Notes (Signed)
   CC: Follow-up  HPI:  Kelly Gallagher is a 51 y.o. female with PMH as below who presents to the clinic for routine follow-up with acute complaints of vaginal itching and discharge.  Past Medical History:  Diagnosis Date   Allergy 12/30/2020   seasonal   Anxiety 12/30/2020   Diabetes mellitus without complication (HCC)    GERD (gastroesophageal reflux disease)    Hypertension    Review of Systems:   A comprehensive review of systems was negative except for: vaginal discharge and groin/vaginal itching.    Physical Exam:  Vitals:   03/05/22 1018  BP: 124/74  Pulse: 72  Temp: 98.3 F (36.8 C)  TempSrc: Oral  SpO2: 99%  Weight: 218 lb 6.4 oz (99.1 kg)  Height: 5\' 5"  (1.651 m)    Constitutional: Well-appearing middle-aged female. In no acute distress. HENT: Normocephalic, atraumatic,  Eyes: Sclera non-icteric, EOM intact Cardio:Regular rate and rhythm. No murmurs, rubs, or gallops. 2+ bilateral radial pulses. Pulm:Clear to auscultation bilaterally. Normal work of breathing on room air. Abdomen: Soft, non-tender, non-distended, positive bowel sounds. for extremity edema. Skin:Warm and dry. Neuro:Alert and oriented x3. No focal deficit noted. Psych:Pleasant mood and affect. GU: Exam deferred   Assessment & Plan:   Hypertension associated with diabetes (HCC) Blood pressure is well-controlled today at 124/74. - Continue metoprolol 100 mg daily and olmesartan/HCTZ 20/12.5 mg daily  Type 2 diabetes mellitus, with long-term current use of insulin (HCC) A1c today down from 9.5-8.6 after starting tirzepatide.  She has titrated her tirzepatide up to 15 mg weekly.  She has also been intermittently decreasing her insulin by 10 based on her preprandial sugars which we discussed she would not need to do.  She continues to have vulvovaginal candidiasis with prior growth of Candida glabrata and is having another symptomatic episode.  She notes that these have happened  about 3-4 times a year since starting SGLT2 inhibitor. - Continue tirzepatide 50 mg weekly, metformin 1000 mg twice daily, and insulin detemir 70 units in the morning and 60 in the evening -Stop Jardiance and send cervicovaginal swab  Vaginal candidiasis Patient with a history of 3-4 episodes of vulvovaginal candidiasis since starting SGLT2 inhibitors.  Today she is having a repeat episode of symptoms including vaginal and groin itching as well as mild vaginal discharge.  She denies any dysuria or suprapubic abdominal pain.  She has previously been positive for Candida glabrata. - Self swab and stop Jardiance  Addendum - Cervicovaginal swab positive for Candida glabrata.  Patient contacted and we will start vaginal boric acid suppositories nightly for 14 days.  If still symptomatic we will continue until 21 days    Patient discussed with Dr. WUJ:[email or web address], DO [facility] Internal Medicine Resident PGY-1 Pager: [phone]

## 2022-03-06 ENCOUNTER — Other Ambulatory Visit (HOSPITAL_COMMUNITY): Payer: Self-pay

## 2022-03-06 ENCOUNTER — Other Ambulatory Visit: Payer: Self-pay | Admitting: Student

## 2022-03-06 DIAGNOSIS — B3731 Acute candidiasis of vulva and vagina: Secondary | ICD-10-CM

## 2022-03-06 LAB — CERVICOVAGINAL ANCILLARY ONLY
Bacterial Vaginitis (gardnerella): NEGATIVE
Candida Glabrata: POSITIVE — AB
Candida Vaginitis: NEGATIVE
Chlamydia: NEGATIVE
Comment: NEGATIVE
Comment: NEGATIVE
Comment: NEGATIVE
Comment: NEGATIVE
Comment: NEGATIVE
Comment: NORMAL
Neisseria Gonorrhea: NEGATIVE
Trichomonas: NEGATIVE

## 2022-03-06 MED ORDER — BORIC ACID VAGINAL 600 MG VA SUPP
600.0000 mg | Freq: Every evening | VAGINAL | 0 refills | Status: AC
  Filled 2022-03-06: qty 14, 14d supply, fill #0

## 2022-03-06 NOTE — Progress Notes (Signed)
Called and spoke to the patient about the results of her cervical vaginal swab that were positive for Candida glabrata like they have been in the past.  Discussed the treatment of 14 days of vaginal suppository boric acid.  She states her understanding and understands that she should not take this medication by mouth.  She will pick up this medication and if her symptoms are not better at the end of therapy she will call the office and we can extend for 7 more days for for treatment of 21 days.

## 2022-03-06 NOTE — Progress Notes (Signed)
Attempted to call patient to discussed cervicovaginal swab results of candida glabrata which appears to be recurrent in her. Will need to treat with 14-21 days of nightly vaginal boric acid 600 mg. Left a message on her voicemail with clinic call back number. Sent rx to [facility].

## 2022-03-07 LAB — BMP8+ANION GAP
Anion Gap: 21 mmol/L — ABNORMAL HIGH (ref 10.0–18.0)
BUN/Creatinine Ratio: 16 (ref 9–23)
BUN: 11 mg/dL (ref 6–24)
CO2: 19 mmol/L — ABNORMAL LOW (ref 20–29)
Calcium: 9.9 mg/dL (ref 8.7–10.2)
Chloride: 100 mmol/L (ref 96–106)
Creatinine, Ser: 0.69 mg/dL (ref 0.57–1.00)
Glucose: 135 mg/dL — ABNORMAL HIGH (ref 70–99)
Potassium: 4.2 mmol/L (ref 3.5–5.2)
Sodium: 140 mmol/L (ref 134–144)
eGFR: 106 mL/min/{1.73_m2} (ref >59)

## 2022-03-08 NOTE — Assessment & Plan Note (Signed)
Blood pressure is well-controlled today at 124/74. - Continue metoprolol 100 mg daily and olmesartan/HCTZ 20/12.5 mg daily

## 2022-03-08 NOTE — Assessment & Plan Note (Signed)
Patient with a history of 3-4 episodes of vulvovaginal candidiasis since starting SGLT2 inhibitors.  Today she is having a repeat episode of symptoms including vaginal and groin itching as well as mild vaginal discharge.  She denies any dysuria or suprapubic abdominal pain.  She has previously been positive for Candida glabrata. - Self swab and stop Jardiance  Addendum - Cervicovaginal swab positive for Candida glabrata.  Patient contacted and we will start vaginal boric acid suppositories nightly for 14 days.  If still symptomatic we will continue until 21 days

## 2022-03-08 NOTE — Assessment & Plan Note (Signed)
A1c today down from 9.5-8.6 after starting tirzepatide.  She has titrated her tirzepatide up to 15 mg weekly.  She has also been intermittently decreasing her insulin by 10 based on her preprandial sugars which we discussed she would not need to do.  She continues to have vulvovaginal candidiasis with prior growth of Candida glabrata and is having another symptomatic episode.  She notes that these have happened about 3-4 times a year since starting SGLT2 inhibitor. - Continue tirzepatide 50 mg weekly, metformin 1000 mg twice daily, and insulin detemir 70 units in the morning and 60 in the evening -Stop Jardiance and send cervicovaginal swab

## 2022-03-09 NOTE — Progress Notes (Signed)
Internal Medicine Clinic Attending  Case discussed with Dr. Goodwin  At the time of the visit.  We reviewed the resident's history and exam and pertinent patient test results.  I agree with the assessment, diagnosis, and plan of care documented in the resident's note.  

## 2022-03-09 NOTE — Progress Notes (Signed)
Called patient and reached VM with ID.  Left message that I was calling about the remainder of her lab results and that she may call our office if she would like further information.

## 2022-03-13 ENCOUNTER — Other Ambulatory Visit: Payer: Self-pay

## 2022-03-13 ENCOUNTER — Other Ambulatory Visit: Payer: Self-pay | Admitting: Student

## 2022-03-13 DIAGNOSIS — E1142 Type 2 diabetes mellitus with diabetic polyneuropathy: Secondary | ICD-10-CM

## 2022-03-18 ENCOUNTER — Other Ambulatory Visit (HOSPITAL_COMMUNITY): Payer: Self-pay

## 2022-03-18 ENCOUNTER — Encounter: Payer: Self-pay | Admitting: Student

## 2022-03-18 ENCOUNTER — Other Ambulatory Visit: Payer: Self-pay

## 2022-03-19 ENCOUNTER — Other Ambulatory Visit: Payer: Self-pay | Admitting: Student

## 2022-03-19 ENCOUNTER — Other Ambulatory Visit (HOSPITAL_COMMUNITY): Payer: Self-pay

## 2022-03-19 ENCOUNTER — Other Ambulatory Visit: Payer: Self-pay

## 2022-03-19 DIAGNOSIS — Z794 Long term (current) use of insulin: Secondary | ICD-10-CM

## 2022-03-19 MED ORDER — LEVEMIR FLEXPEN 100 UNIT/ML ~~LOC~~ SOPN
PEN_INJECTOR | SUBCUTANEOUS | 3 refills | Status: DC
  Filled 2022-03-19: qty 30, 23d supply, fill #0
  Filled 2022-03-21: qty 30, 28d supply, fill #0
  Filled 2022-04-10: qty 3, 3d supply, fill #1

## 2022-03-19 MED ORDER — INSULIN PEN NEEDLE 32G X 4 MM MISC
3 refills | Status: DC
  Filled 2022-03-19: qty 200, 90d supply, fill #0
  Filled 2022-06-25: qty 200, 90d supply, fill #1
  Filled 2022-09-25: qty 200, 90d supply, fill #2
  Filled 2022-12-19: qty 200, 90d supply, fill #3

## 2022-03-21 ENCOUNTER — Other Ambulatory Visit (HOSPITAL_COMMUNITY): Payer: Self-pay

## 2022-03-24 ENCOUNTER — Other Ambulatory Visit (HOSPITAL_COMMUNITY): Payer: Self-pay

## 2022-03-24 ENCOUNTER — Other Ambulatory Visit: Payer: Self-pay

## 2022-03-27 ENCOUNTER — Other Ambulatory Visit: Payer: Self-pay

## 2022-03-28 ENCOUNTER — Encounter: Payer: Self-pay | Admitting: Student

## 2022-03-30 ENCOUNTER — Other Ambulatory Visit: Payer: Self-pay

## 2022-03-30 ENCOUNTER — Other Ambulatory Visit (HOSPITAL_COMMUNITY): Payer: Self-pay

## 2022-04-10 ENCOUNTER — Other Ambulatory Visit (HOSPITAL_COMMUNITY): Payer: Self-pay

## 2022-04-10 ENCOUNTER — Other Ambulatory Visit: Payer: Self-pay

## 2022-04-14 ENCOUNTER — Other Ambulatory Visit: Payer: Self-pay

## 2022-04-15 ENCOUNTER — Other Ambulatory Visit: Payer: Self-pay

## 2022-04-15 DIAGNOSIS — E1142 Type 2 diabetes mellitus with diabetic polyneuropathy: Secondary | ICD-10-CM

## 2022-04-16 ENCOUNTER — Other Ambulatory Visit: Payer: Self-pay

## 2022-04-16 ENCOUNTER — Other Ambulatory Visit (HOSPITAL_COMMUNITY): Payer: Self-pay

## 2022-04-16 ENCOUNTER — Telehealth: Payer: Self-pay

## 2022-04-16 MED ORDER — INSULIN DETEMIR 100 UNIT/ML FLEXPEN
SUBCUTANEOUS | 3 refills | Status: DC
  Filled 2022-04-16 – 2022-04-20 (×2): qty 30, 28d supply, fill #0

## 2022-04-16 NOTE — Telephone Encounter (Signed)
Rec'd a fax from patients insurance regarding Levemir Flexpen coverage.  Insurance wants step therapy of [place] (YFGN) , [place], or [place].   Could one of these alternatives be sent in, or would you like me to initiate an prior authorization?

## 2022-04-18 ENCOUNTER — Encounter: Payer: Self-pay | Admitting: Student

## 2022-04-18 ENCOUNTER — Other Ambulatory Visit (HOSPITAL_COMMUNITY): Payer: Self-pay

## 2022-04-20 ENCOUNTER — Other Ambulatory Visit (HOSPITAL_COMMUNITY): Payer: Self-pay

## 2022-04-20 ENCOUNTER — Other Ambulatory Visit: Payer: Self-pay | Admitting: Student

## 2022-04-20 ENCOUNTER — Encounter (HOSPITAL_COMMUNITY): Payer: Self-pay

## 2022-04-20 ENCOUNTER — Other Ambulatory Visit: Payer: Self-pay

## 2022-04-20 MED ORDER — INSULIN GLARGINE 100 UNIT/ML ~~LOC~~ SOLN
SUBCUTANEOUS | 3 refills | Status: DC
  Filled 2022-04-20: qty 40, 30d supply, fill #0

## 2022-04-20 NOTE — Telephone Encounter (Signed)
Patient called in stating she only has 50 units of levemir left and takes 70 units per day. Please see message below.

## 2022-04-20 NOTE — Telephone Encounter (Signed)
Insurance will pay for [employer]) , [place], or [place].   However only 30MLs per 28 days (2 boxes of 5 pens each - [place] and [place]). Patient is on a high dose that exceeds this.  Insulin Glargine (Lantus) not covered.

## 2022-04-21 ENCOUNTER — Other Ambulatory Visit (HOSPITAL_COMMUNITY): Payer: Self-pay

## 2022-04-21 ENCOUNTER — Other Ambulatory Visit: Payer: Self-pay | Admitting: Student

## 2022-04-21 ENCOUNTER — Other Ambulatory Visit: Payer: Self-pay

## 2022-04-21 MED ORDER — INSULIN GLARGINE-YFGN 100 UNIT/ML ~~LOC~~ SOPN
PEN_INJECTOR | SUBCUTANEOUS | 2 refills | Status: DC
  Filled 2022-04-21: qty 30, 28d supply, fill #0

## 2022-04-21 MED ORDER — INSULIN GLARGINE-YFGN 100 UNIT/ML ~~LOC~~ SOPN
PEN_INJECTOR | SUBCUTANEOUS | 0 refills | Status: DC
  Filled 2022-04-21: qty 30, 24d supply, fill #0

## 2022-04-21 MED ORDER — INSULIN GLARGINE-YFGN 100 UNIT/ML ~~LOC~~ SOLN
SUBCUTANEOUS | 11 refills | Status: DC
  Filled 2022-04-21: qty 10, 8d supply, fill #0

## 2022-04-21 MED ORDER — OMEPRAZOLE 20 MG PO CPDR
20.0000 mg | DELAYED_RELEASE_CAPSULE | Freq: Every day | ORAL | 3 refills | Status: DC
  Filled 2022-04-21: qty 90, 90d supply, fill #0
  Filled 2022-07-16: qty 90, 90d supply, fill #1
  Filled 2022-10-14: qty 90, 90d supply, fill #2
  Filled 2023-01-12: qty 90, 90d supply, fill #3

## 2022-04-21 MED ORDER — ATORVASTATIN CALCIUM 20 MG PO TABS
20.0000 mg | ORAL_TABLET | Freq: Every day | ORAL | 3 refills | Status: DC
  Filled 2022-04-21: qty 90, 90d supply, fill #0
  Filled 2022-07-16: qty 90, 90d supply, fill #1
  Filled 2022-10-14: qty 90, 90d supply, fill #2
  Filled 2023-01-12: qty 90, 90d supply, fill #3

## 2022-04-22 ENCOUNTER — Encounter: Payer: Self-pay | Admitting: Student

## 2022-04-22 ENCOUNTER — Other Ambulatory Visit (HOSPITAL_COMMUNITY): Payer: Self-pay

## 2022-04-22 ENCOUNTER — Other Ambulatory Visit: Payer: Self-pay

## 2022-04-23 ENCOUNTER — Other Ambulatory Visit (HOSPITAL_COMMUNITY): Payer: Self-pay

## 2022-04-23 ENCOUNTER — Other Ambulatory Visit: Payer: Self-pay

## 2022-04-23 MED ORDER — INSULIN GLARGINE-YFGN 100 UNIT/ML ~~LOC~~ SOPN
PEN_INJECTOR | SUBCUTANEOUS | 10 refills | Status: DC
  Filled 2022-04-23 – 2022-05-12 (×3): qty 30, 23d supply, fill #0
  Filled 2022-05-14: qty 30, 28d supply, fill #0
  Filled 2022-06-01: qty 30, 23d supply, fill #1
  Filled 2022-06-03: qty 30, 28d supply, fill #1
  Filled ????-??-??: fill #1

## 2022-04-23 NOTE — Addendum Note (Signed)
Addended by: Serita Butcher on: 04/23/2022 11:49 AM   Modules accepted: Orders

## 2022-04-28 ENCOUNTER — Other Ambulatory Visit (HOSPITAL_COMMUNITY): Payer: Self-pay

## 2022-05-06 NOTE — Addendum Note (Signed)
Addended by: Serita Butcher on: 05/06/2022 04:21 PM   Modules accepted: Orders

## 2022-05-07 ENCOUNTER — Other Ambulatory Visit: Payer: Self-pay | Admitting: Internal Medicine

## 2022-05-07 DIAGNOSIS — E1142 Type 2 diabetes mellitus with diabetic polyneuropathy: Secondary | ICD-10-CM

## 2022-05-08 ENCOUNTER — Telehealth: Payer: 59 | Admitting: Physician Assistant

## 2022-05-08 ENCOUNTER — Other Ambulatory Visit: Payer: Self-pay

## 2022-05-08 ENCOUNTER — Encounter: Payer: Self-pay | Admitting: Physician Assistant

## 2022-05-08 DIAGNOSIS — J019 Acute sinusitis, unspecified: Secondary | ICD-10-CM | POA: Diagnosis not present

## 2022-05-08 DIAGNOSIS — B9789 Other viral agents as the cause of diseases classified elsewhere: Secondary | ICD-10-CM | POA: Diagnosis not present

## 2022-05-08 MED ORDER — METFORMIN HCL 1000 MG PO TABS
1000.0000 mg | ORAL_TABLET | Freq: Two times a day (BID) | ORAL | 3 refills | Status: DC
  Filled 2022-05-08: qty 60, 30d supply, fill #0
  Filled 2022-06-25: qty 60, 30d supply, fill #1
  Filled 2022-07-21: qty 60, 30d supply, fill #2
  Filled 2022-08-23: qty 60, 30d supply, fill #3

## 2022-05-08 MED ORDER — FLUTICASONE PROPIONATE 50 MCG/ACT NA SUSP: 2.0000 | Freq: Every day | NASAL | 0 refills | Status: DC

## 2022-05-08 MED ORDER — PREDNISONE 20 MG PO TABS: 40.0000 mg | ORAL_TABLET | Freq: Every day | ORAL | 0 refills | Status: DC

## 2022-05-08 NOTE — Patient Instructions (Signed)
Kelly Gallagher, thank you for joining [name], PA-C for today's virtual visit.  While this provider is not your primary care provider (PCP), if your PCP is located in our provider database this encounter information will be shared with them immediately following your visit.   [place] account gives you access to today's visit and all your visits, tests, and labs performed at [facility] " click here if you don't have a [facility] account or go to [email or web address]  Consent: (Patient) Kelly Gallagher provided verbal consent for this virtual visit at the beginning of the encounter.  Current Medications:  Current Outpatient Medications:    Albuterol Sulfate 108 (90 Base) MCG/ACT AEPB, Inhale 2 puffs into the lungs every 4 (four) hours as needed. (Patient not taking: Reported on 08/20/2021), Disp: 1 each, Rfl: 4   atorvastatin (LIPITOR) 20 MG tablet, Take 1 tablet (20 mg total) by mouth daily., Disp: 90 tablet, Rfl: 3   Cholecalciferol (VITAMIN D3) 10 MCG (400 UNIT) tablet, daily., Disp: , Rfl:    clotrimazole (LOTRIMIN) 1 % cream, Apply topically 2 times daily., Disp: 28 g, Rfl: 0   Continuous Blood Gluc Receiver (FREESTYLE LIBRE 2 READER SYSTM) DEVI, 1 each by Does not apply route 4 (four) times daily., Disp: 1 each, Rfl: 0   Continuous Blood Gluc Sensor (FREESTYLE LIBRE 2 SENSOR) MISC, Use 1 sensor every 14 days as directed., Disp: 2 each, Rfl: 11   DULoxetine (CYMBALTA) 60 MG capsule, Take 1 capsule (60 mg total) by mouth daily., Disp: 30 capsule, Rfl: 5   fluticasone (FLOVENT HFA) 44 MCG/ACT inhaler, Inhale 1 puff into the lungs 2 times daily., Disp: 10.6 g, Rfl: 2   insulin glargine-yfgn (SEMGLEE) 100 UNIT/ML Pen, Inject 70 Units into the skin in the morning AND 60 Units every evening., Disp: 30 mL, Rfl: 10   Insulin Pen Needle 32G X 4 MM MISC, Used to inject insulin twice dailys, Disp: 200 each, Rfl: 3   levonorgestrel (MIRENA, 52 MG,) 20  MCG/DAY IUD, , Disp: , Rfl:    loratadine (CLARITIN) 10 MG tablet, Take 10 mg by mouth daily., Disp: , Rfl:    metFORMIN (GLUCOPHAGE) 1000 MG tablet, Take 1 tablet (1,000 mg total) by mouth 2 (two) times daily with a meal., Disp: 60 tablet, Rfl: 3   metoprolol succinate (TOPROL-XL) 100 MG 24 hr tablet, TAKE 1 TABLET BY MOUTH DAILY WITH OR IMMEDIATELY FOLLOWING A MEAL, Disp: 90 tablet, Rfl: 3   Multiple Vitamin (MULTIVITAMIN) tablet, Take 1 tablet by mouth daily., Disp: , Rfl:    nystatin powder, Apply to affected area 3 times daily., Disp: 15 g, Rfl: 0   olmesartan-hydrochlorothiazide (BENICAR HCT) 20-12.5 MG tablet, Take 1 tablet by mouth daily., Disp: 90 tablet, Rfl: 3   Omega-3 Fatty Acids (FISH OIL) 1000 MG CAPS, Take 1 capsule (1,000 mg total) by mouth daily., Disp: 90 capsule, Rfl: 0   omeprazole (PRILOSEC) 20 MG capsule, Take 1 capsule (20 mg total) by mouth daily., Disp: 90 capsule, Rfl: 3   ondansetron (ZOFRAN) 4 MG tablet, Take 1 tablet by mouth every 8 hours as needed for nausea or vomiting., Disp: 30 tablet, Rfl: 0   ondansetron (ZOFRAN-ODT) 4 MG disintegrating tablet, Dissolve 1 tablet (4 mg total) by mouth every 8 (eight) hours as needed for nausea or vomiting., Disp: 20 tablet, Rfl: 0   potassium chloride SA (KLOR-CON M) 20 MEQ tablet, Take 1 tablet (20 mEq total) by mouth daily  for 7 doses., Disp: 7 tablet, Rfl: 0   Probiotic Product (PROBIOTIC-10 PO), Take by mouth., Disp: , Rfl:    tirzepatide (MOUNJARO) 15 MG/0.5ML Pen, Inject 15 mg into the skin once a week., Disp: 6 mL, Rfl: 3   vitamin B-12 (CYANOCOBALAMIN) 1000 MCG tablet, Take by mouth., Disp: , Rfl:    Medications ordered in this encounter:  No orders of the defined types were placed in this encounter.    *If you need refills on other medications prior to your next appointment, please contact your pharmacy*  Follow-Up: Call back or seek an in-person evaluation if the symptoms worsen or if the condition fails to improve  as anticipated.  [place] [phone]  Other Instructions Please keep well-hydrated and rest. Start saline nasal rinse and Flonase. Ok to continue your OTC medications. Use the prednisone as directed. If not resolving, or anything worsening, please let me know.    If you have been instructed to have an in-person evaluation today at a local Urgent Care facility, please use the link below. It will take you to a list of all of our available [facility], including address, phone number and hours of operation. Please do not delay care.  [facility]  If you or a family member do not have a primary care provider, use the link below to schedule a visit and establish care. When you choose a [facility] primary care physician or advanced practice provider, you gain a long-term partner in health. Find a Primary Care Provider  Learn more about [facility]'s in-office and virtual care options: [facility]

## 2022-05-08 NOTE — Progress Notes (Signed)
Virtual Visit Consent   Kelly Gallagher, you are scheduled for a virtual visit with a [facility] provider today. Just as with appointments in the office, your consent must be obtained to participate. Your consent will be active for this visit and any virtual visit you may have with one of our providers in the next 365 days. If you have a MyChart account, a copy of this consent can be sent to you electronically.  As this is a virtual visit, video technology does not allow for your provider to perform a traditional examination. This may limit your provider's ability to fully assess your condition. If your provider identifies any concerns that need to be evaluated in person or the need to arrange testing (such as labs, EKG, etc.), we will make arrangements to do so. Although advances in technology are sophisticated, we cannot ensure that it will always work on either your end or our end. If the connection with a video visit is poor, the visit may have to be switched to a telephone visit. With either a video or telephone visit, we are not always able to ensure that we have a secure connection.  By engaging in this virtual visit, you consent to the provision of healthcare and authorize for your insurance to be billed (if applicable) for the services provided during this visit. Depending on your insurance coverage, you may receive a charge related to this service.  I need to obtain your verbal consent now. Are you willing to proceed with your visit today? [name] has provided verbal consent on 05/08/2022 for a virtual visit (video or telephone). Kelly Gallagher, [place]  Date: 05/08/2022 5:20 PM  Virtual Visit via Video Note   I, Kelly Gallagher, connected with  ASHLYNE RIDPATH  ([id number], November 14, 1970) on 05/08/22 at  5:15 PM EST by a video-enabled telemedicine application and verified that I am speaking with the correct person using two identifiers.  Location: Patient: Virtual Visit Location  Patient: Home Provider: Virtual Visit Location Provider: Home Office   I discussed the limitations of evaluation and management by telemedicine and the availability of in person appointments. The patient expressed understanding and agreed to proceed.    History of Present Illness: Kelly Gallagher is a 52 y.o. who identifies as a female who was assigned female at birth, and is being seen today for 2.5 days of nasal congestion, sinus pressure acutely worsening overnight into today with sinus pressure, sinus pain. Denies fever but noted some chills. Denies chest pain or SOB. Husband with cold last week but got over it very quickly. No other known sick contacts. Has not tested for COVID but denies any known exposure Works from home. Has not COVID tested.   HPI: HPI  Problems:  Patient Active Problem List   Diagnosis Date Noted   Wrist pain, acute, right 11/11/2021   Intertrigo 11/11/2021   Upper respiratory infection, viral 11/11/2021   Neutrophilia 08/14/2021   Hypotension 08/14/2021   Hypokalemia 08/14/2021   Hypercalcemia 08/14/2021   GI symptoms 04/15/2021   Weight gain 07/25/2020   Vaginal candidiasis 04/25/2019   Healthcare maintenance 04/29/2018   Type 2 diabetes mellitus, with long-term current use of insulin ([place]) 04/28/2018   Hypertension associated with diabetes ([place]) 04/28/2018   Generalized anxiety disorder 04/28/2018   Hyperlipidemia associated with type 2 diabetes mellitus ([place]) 04/28/2018   Morbid obesity ([place]) 04/28/2018   IUD (intrauterine device) in place 04/28/2018   GERD (gastroesophageal reflux disease) 04/28/2018  Reactive airway disease 04/28/2018   Vitamin B12 deficiency 04/28/2018    Allergies:  Allergies  Allergen Reactions   Ace Inhibitors Anaphylaxis   Effexor Xr [Venlafaxine Hcl]     Patient states caused symptomatic elevated serotonin levels   Medications:  Current Outpatient Medications:    fluticasone (FLONASE) 50 MCG/ACT nasal spray, Place 2  sprays into both nostrils daily., Disp: 16 g, Rfl: 0   predniSONE (DELTASONE) 20 MG tablet, Take 2 tablets (40 mg total) by mouth daily with breakfast., Disp: 6 tablet, Rfl: 0   Albuterol Sulfate 108 (90 Base) MCG/ACT AEPB, Inhale 2 puffs into the lungs every 4 (four) hours as needed. (Patient not taking: Reported on 08/20/2021), Disp: 1 each, Rfl: 4   atorvastatin (LIPITOR) 20 MG tablet, Take 1 tablet (20 mg total) by mouth daily., Disp: 90 tablet, Rfl: 3   Cholecalciferol (VITAMIN D3) 10 MCG (400 UNIT) tablet, daily., Disp: , Rfl:    clotrimazole (LOTRIMIN) 1 % cream, Apply topically 2 times daily., Disp: 28 g, Rfl: 0   Continuous Blood Gluc Receiver (FREESTYLE LIBRE 2 READER SYSTM) DEVI, 1 each by Does not apply route 4 (four) times daily., Disp: 1 each, Rfl: 0   Continuous Blood Gluc Sensor (FREESTYLE LIBRE 2 SENSOR) MISC, Use 1 sensor every 14 days as directed., Disp: 2 each, Rfl: 11   DULoxetine (CYMBALTA) 60 MG capsule, Take 1 capsule (60 mg total) by mouth daily., Disp: 30 capsule, Rfl: 5   fluticasone (FLOVENT HFA) 44 MCG/ACT inhaler, Inhale 1 puff into the lungs 2 times daily., Disp: 10.6 g, Rfl: 2   insulin glargine-yfgn (SEMGLEE) 100 UNIT/ML Pen, Inject 70 Units into the skin in the morning AND 60 Units every evening., Disp: 30 mL, Rfl: 10   Insulin Pen Needle 32G X 4 MM MISC, Used to inject insulin twice dailys, Disp: 200 each, Rfl: 3   levonorgestrel (MIRENA, 52 MG,) 20 MCG/DAY IUD, , Disp: , Rfl:    loratadine (CLARITIN) 10 MG tablet, Take 10 mg by mouth daily., Disp: , Rfl:    metFORMIN (GLUCOPHAGE) 1000 MG tablet, Take 1 tablet (1,000 mg total) by mouth 2 (two) times daily with a meal., Disp: 60 tablet, Rfl: 3   metoprolol succinate (TOPROL-XL) 100 MG 24 hr tablet, TAKE 1 TABLET BY MOUTH DAILY WITH OR IMMEDIATELY FOLLOWING A MEAL, Disp: 90 tablet, Rfl: 3   Multiple Vitamin (MULTIVITAMIN) tablet, Take 1 tablet by mouth daily., Disp: , Rfl:    nystatin powder, Apply to affected area 3  times daily., Disp: 15 g, Rfl: 0   olmesartan-hydrochlorothiazide (BENICAR HCT) 20-12.5 MG tablet, Take 1 tablet by mouth daily., Disp: 90 tablet, Rfl: 3   Omega-3 Fatty Acids (FISH OIL) 1000 MG CAPS, Take 1 capsule (1,000 mg total) by mouth daily., Disp: 90 capsule, Rfl: 0   omeprazole (PRILOSEC) 20 MG capsule, Take 1 capsule (20 mg total) by mouth daily., Disp: 90 capsule, Rfl: 3   ondansetron (ZOFRAN) 4 MG tablet, Take 1 tablet by mouth every 8 hours as needed for nausea or vomiting., Disp: 30 tablet, Rfl: 0   ondansetron (ZOFRAN-ODT) 4 MG disintegrating tablet, Dissolve 1 tablet (4 mg total) by mouth every 8 (eight) hours as needed for nausea or vomiting., Disp: 20 tablet, Rfl: 0   potassium chloride SA (KLOR-CON M) 20 MEQ tablet, Take 1 tablet (20 mEq total) by mouth daily for 7 doses., Disp: 7 tablet, Rfl: 0   Probiotic Product (PROBIOTIC-10 PO), Take by mouth., Disp: , Rfl:  tirzepatide [facility]) 15 MG/0.5ML Pen, Inject 15 mg into the skin once a week., Disp: 6 mL, Rfl: 3   vitamin B-12 (CYANOCOBALAMIN) 1000 MCG tablet, Take by mouth., Disp: , Rfl:   Observations/Objective: Patient is well-developed, well-nourished in no acute distress.  Resting comfortably at home.  Head is normocephalic, atraumatic.  No labored breathing. Speech is clear and coherent with logical content.  Patient is alert and oriented at baseline.   Assessment and Plan: 1. Acute viral sinusitis - fluticasone (FLONASE) 50 MCG/ACT nasal spray; Place 2 sprays into both nostrils daily.  Dispense: 16 g; Refill: 0 - predniSONE (DELTASONE) 20 MG tablet; Take 2 tablets (40 mg total) by mouth daily with breakfast.  Dispense: 6 tablet; Refill: 0  Will have her COVID test to be cautious. She has a test at home so will test shortly and let us know the results.   Update -- COVID negative. Supportive measures and OTC medications reviewed. Flonase per orders. Prednisone as directed. Follow-up if not improving.  Follow Up  Instructions: I discussed the assessment and treatment plan with the patient. The patient was provided an opportunity to ask questions and all were answered. The patient agreed with the plan and demonstrated an understanding of the instructions.  A copy of instructions were sent to the patient via MyChart unless otherwise noted below.   The patient was advised to call back or seek an in-person evaluation if the symptoms worsen or if the condition fails to improve as anticipated.  Time:  I spent 10 minutes with the patient via telehealth technology discussing the above problems/concerns.    Kelly Rio, PA-C

## 2022-05-11 ENCOUNTER — Other Ambulatory Visit (HOSPITAL_COMMUNITY): Payer: Self-pay

## 2022-05-11 ENCOUNTER — Other Ambulatory Visit: Payer: Self-pay

## 2022-05-12 ENCOUNTER — Other Ambulatory Visit: Payer: Self-pay

## 2022-05-12 ENCOUNTER — Other Ambulatory Visit (HOSPITAL_COMMUNITY): Payer: Self-pay

## 2022-05-14 ENCOUNTER — Other Ambulatory Visit (HOSPITAL_COMMUNITY): Payer: Self-pay

## 2022-05-14 ENCOUNTER — Other Ambulatory Visit: Payer: Self-pay

## 2022-05-18 ENCOUNTER — Other Ambulatory Visit: Payer: Self-pay

## 2022-05-26 DIAGNOSIS — H5213 Myopia, bilateral: Secondary | ICD-10-CM | POA: Diagnosis not present

## 2022-05-26 LAB — HM DIABETES EYE EXAM

## 2022-06-01 NOTE — Progress Notes (Signed)
   CC: Routine Follow-up  HPI:   [name] is a 52 y.o. female with a past medical history of hypertension, diabetes, and anxiety who presents for routine follow-up. She was last seen at [facility] on 12-14.    Past Medical History:  Diagnosis Date   Allergy 12/30/2020   seasonal   Anxiety 12/30/2020   Diabetes mellitus without complication (HCC)    GERD (gastroesophageal reflux disease)    Hypertension      Review of Systems:    Reports feeling good overall without any acute complaints Denies lightheadedness, dizziness, blurry vision, headache, anxiety, chest pain, palpitations    Physical Exam:  Vitals:   06/04/22 0905  BP: 93/67  Pulse: 79  Resp: (!) 24  Temp: 97.8 F (36.6 C)  TempSrc: Oral  SpO2: 100%  Weight: 222 lb (100.7 kg)  Height: 5\' 5"  (1.651 m)    General:   awake and alert, sitting comfortably in chair, cooperative, not in acute distress Lungs:   normal respiratory effort, breathing unlabored, symmetrical chest rise, no crackles or wheezing Cardiac:   regular rate and rhythm, normal S1 and S2 Neurologic:   oriented to person-place-time, moving all extremities, no gross focal deficits Psychiatric:   euthymic mood with congruent affect, intelligible speech    Assessment & Plan:   Hypertension associated with diabetes ([place]) Patient has a history of hypertension managed with metoprolol and olmesartan-hydrochlorothiazide. She reports daily adherence to metoprolol, but will skip olmesartan-hydrochlorothiazide dose as instructed if her systolic BP is less than 130QMVH. This happens about 1-2x per week. Usually, her home systolic BP values are in the 110-120s range and today in clinic 93/67. Denies lightheadedness, dizziness, blurry vision, headache, anxiety, chest pain, and palpitations.  - Continue metoprolol 100mg  q24 - Continue olmesartan-hydrochlorothiazide 20-12.5mg  q24 unless SBP<110    Type 2 diabetes mellitus, with long-term current use of  insulin ([place]) Patient has history of diabetes managed with tirzepadie, metformin, and insulin glargine. Current doses are 15mg  weekly, 1000mg  twice daily, and 70+60U daily respectively. At her last visit in 02-2022, her empagliflozin was discontinued due to recurrent vaginal candidiasis. Prior A1C collected 02-2022 was 8.6, today in clinic 8.5. Home glucose readings have been 76% in target range of 70-180 and 24% in high range of 181-250. Denies dizziness, lightheadedness, syncope, headache, and fatigue. Given recent discontinuation of empagliflozin and need for better glycemic control, we will increase glargine dose to 70+70U daily.   - Continue metformin 1000mg  q12 and tirzepatide 15mg  q168 - Increase insulin glargine to 70+70U q24 and refill exactly 93mL per month  - Switch to [employer] 3 glucometer and ensure hypoglycemia alarm is active    Vaginal candidiasis Patient has experienced 4-5 episodes of vulvovaginal candidiasis since starting empagliflozin. At her last visit in 02-2022, she reported vaginal and groin pruritus plus mild vaginal discharge. Her empagliflozin was subsequently discontinued, also prescribed boric acid vaginal suppositories for fourteen days. Since discontinuing empagliflozin and finishing course of boric acid, the patient denies any vaginal symptoms including discharge or pruritus. Further denies dysuria, abdominal discomfort, suprapubic pain, and urine discoloration.   - Monitor clinically and avoid SGlT-2 inhibitors      See Encounters Tab for problem based charting.  Patient discussed with Dr.  Saverio Danker

## 2022-06-02 ENCOUNTER — Other Ambulatory Visit (HOSPITAL_COMMUNITY): Payer: Self-pay

## 2022-06-02 ENCOUNTER — Other Ambulatory Visit: Payer: Self-pay

## 2022-06-03 ENCOUNTER — Other Ambulatory Visit (HOSPITAL_COMMUNITY): Payer: Self-pay

## 2022-06-04 ENCOUNTER — Ambulatory Visit (INDEPENDENT_AMBULATORY_CARE_PROVIDER_SITE_OTHER): Payer: 59 | Admitting: Student

## 2022-06-04 ENCOUNTER — Other Ambulatory Visit (HOSPITAL_COMMUNITY): Payer: Self-pay

## 2022-06-04 ENCOUNTER — Encounter: Payer: Self-pay | Admitting: Student

## 2022-06-04 ENCOUNTER — Other Ambulatory Visit: Payer: Self-pay

## 2022-06-04 VITALS — BP 93/67 | HR 79 | Temp 97.8°F | Resp 24 | Ht 65.0 in | Wt 222.0 lb

## 2022-06-04 DIAGNOSIS — I152 Hypertension secondary to endocrine disorders: Secondary | ICD-10-CM

## 2022-06-04 DIAGNOSIS — B3731 Acute candidiasis of vulva and vagina: Secondary | ICD-10-CM | POA: Diagnosis not present

## 2022-06-04 DIAGNOSIS — E1142 Type 2 diabetes mellitus with diabetic polyneuropathy: Secondary | ICD-10-CM | POA: Diagnosis not present

## 2022-06-04 DIAGNOSIS — Z794 Long term (current) use of insulin: Secondary | ICD-10-CM

## 2022-06-04 DIAGNOSIS — E1159 Type 2 diabetes mellitus with other circulatory complications: Secondary | ICD-10-CM | POA: Diagnosis not present

## 2022-06-04 LAB — POCT GLYCOSYLATED HEMOGLOBIN (HGB A1C): Hemoglobin A1C: 8.5 % — AB (ref 4.0–5.6)

## 2022-06-04 LAB — GLUCOSE, CAPILLARY: Glucose-Capillary: 168 mg/dL — ABNORMAL HIGH (ref 70–99)

## 2022-06-04 MED ORDER — FREESTYLE LIBRE 3 SENSOR MISC
1.0000 | Freq: Once | 0 refills | Status: DC
  Filled 2022-06-04: qty 2, 28d supply, fill #0

## 2022-06-04 MED ORDER — INSULIN GLARGINE-YFGN 100 UNIT/ML ~~LOC~~ SOPN
PEN_INJECTOR | SUBCUTANEOUS | 2 refills | Status: DC
  Filled 2022-06-04 (×2): qty 42, 30d supply, fill #0
  Filled 2022-06-05: qty 30, 28d supply, fill #0
  Filled 2022-06-05: qty 42, 30d supply, fill #1
  Filled 2022-06-26: qty 30, 28d supply, fill #1
  Filled 2022-07-28: qty 30, 28d supply, fill #2
  Filled 2022-08-23: qty 30, 28d supply, fill #3
  Filled 2022-09-15 – 2022-10-07 (×5): qty 30, 28d supply, fill #4

## 2022-06-04 NOTE — Patient Instructions (Addendum)
  Thank you, [name], for allowing [place] to provide your care today. Today we discussed . . .  > Diabetes       - continue to take your medications including long-acting insulin, we are checking your A1C and will call you with the results       - your glucose values on the glucometer looked good with 74% in the target range       - we will try ordering 35mL of glargine for the next month and hopefully your insurance will cover the full month supply > Hypertension       - continue to take your blood pressure medications and skipping your olmesartan-hydrochlorothiazide if systolic BP <732    I have ordered the following labs for you:  Lab Orders         POC Hbg A1C       Tests ordered today:  none   Referrals ordered today:   Referral Orders  No referral(s) requested today      I have ordered the following medication/changed the following medications:   Stop the following medications: There are no discontinued medications.   Start the following medications: Meds ordered this encounter  Medications   Continuous Blood Gluc Sensor (FREESTYLE LIBRE 3 SENSOR) MISC    Sig: 1 Box by Does not apply route once for 1 dose. Place 1 sensor on the skin every 14 days. Use to check glucose continuously    Dispense:  2 each    Refill:  0      Follow up: 3 months    Remember:  Please continue to take your medications and try out the new glucometer that we ordered. We will call you with your A1C results and see you again in about three months!   Should you have any questions or concerns please call the internal medicine clinic at [phone].     Roswell Nickel, MD [place]

## 2022-06-04 NOTE — Assessment & Plan Note (Addendum)
Patient has history of diabetes managed with tirzepadie, metformin, and insulin glargine. Current doses are '15mg'$  weekly, '1000mg'$  twice daily, and 70+60U daily respectively. At her last visit in 02-2022, her empagliflozin was discontinued due to recurrent vaginal candidiasis. Prior A1C collected 02-2022 was 8.6, today in clinic 8.5. Home glucose readings have been 76% in target range of 70-180 and 24% in high range of 181-250. Denies dizziness, lightheadedness, syncope, headache, and fatigue. Given recent discontinuation of empagliflozin and need for better glycemic control, we will increase glargine dose to 70+70U daily.   - Continue metformin '1000mg'$  q12 and tirzepatide '15mg'$  q168 - Increase insulin glargine to 70+70U q24 and refill exactly 10m per month  - Switch to [employer] glucometer and ensure hypoglycemia alarm is active

## 2022-06-04 NOTE — Assessment & Plan Note (Signed)
Patient has experienced 4-5 episodes of vulvovaginal candidiasis since starting empagliflozin. At her last visit in 02-2022, she reported vaginal and groin pruritus plus mild vaginal discharge. Her empagliflozin was subsequently discontinued, also prescribed boric acid vaginal suppositories for fourteen days. Since discontinuing empagliflozin and finishing course of boric acid, the patient denies any vaginal symptoms including discharge or pruritus. Further denies dysuria, abdominal discomfort, suprapubic pain, and urine discoloration.   - Monitor clinically and avoid SGlT-2 inhibitors

## 2022-06-04 NOTE — Assessment & Plan Note (Signed)
Patient has a history of hypertension managed with metoprolol and olmesartan-hydrochlorothiazide. She reports daily adherence to metoprolol, but will skip olmesartan-hydrochlorothiazide dose as instructed if her systolic BP is less than [id number]. This happens about 1-2x per week. Usually, her home systolic BP values are in the 110-120s range and today in clinic 93/67. Denies lightheadedness, dizziness, blurry vision, headache, anxiety, chest pain, and palpitations.  - Continue metoprolol '100mg'$  q24 - Continue olmesartan-hydrochlorothiazide 20-12.'5mg'$  q24 unless SBP<110

## 2022-06-05 ENCOUNTER — Telehealth: Payer: Self-pay

## 2022-06-05 ENCOUNTER — Other Ambulatory Visit (HOSPITAL_COMMUNITY): Payer: Self-pay

## 2022-06-05 NOTE — Addendum Note (Signed)
Addended by: Charise Killian on: 06/05/2022 11:16 AM   Modules accepted: Level of Service

## 2022-06-05 NOTE — Telephone Encounter (Signed)
Prior Authorization for patient (semglee yfgn) came through on cover my meds was submitted with last office notes and labs awaiting approval or denial

## 2022-06-05 NOTE — Progress Notes (Addendum)
Internal Medicine Clinic Attending  Case discussed with Dr. Jodi Mourning  At the time of the visit.  We reviewed the resident's history and exam and pertinent patient test results.  I agree with the assessment, diagnosis, and plan of care documented in the resident's note. Increase long-acting insulin to further improve fasting BG towards goal. No episodes of hypoglycemia. May need to consider prandial insulin at next visit.

## 2022-06-08 ENCOUNTER — Encounter: Payer: Self-pay | Admitting: Dietician

## 2022-06-09 NOTE — Telephone Encounter (Signed)
Decision:Approved Lavell Islam (Key: [id number]) PA Case ID #: [id number] Rx #: [id number] Need Help? Call us at [phone] Outcome Approved today The request has been approved. The authorization is effective from 06/09/2022 to 06/08/2023, as long as the member is enrolled in their current health plan. The request was reviewed and approved by a licensed clinical pharmacist. This request is approved for 1.54mL per day. A written notification letter will follow with additional details. Authorization Expiration Date: 06/07/2023 Drug [employer] (yfgn) 100UNIT/ML pen-injectors ePA cloud logo Form [employer] ePA Form 2017 NCPDP

## 2022-06-25 ENCOUNTER — Other Ambulatory Visit: Payer: Self-pay

## 2022-06-26 ENCOUNTER — Other Ambulatory Visit: Payer: Self-pay

## 2022-06-26 ENCOUNTER — Other Ambulatory Visit (HOSPITAL_COMMUNITY): Payer: Self-pay

## 2022-06-27 ENCOUNTER — Other Ambulatory Visit (HOSPITAL_COMMUNITY): Payer: Self-pay

## 2022-06-29 ENCOUNTER — Other Ambulatory Visit: Payer: Self-pay

## 2022-06-30 ENCOUNTER — Telehealth: Payer: Self-pay

## 2022-06-30 NOTE — Telephone Encounter (Signed)
Sent patient reminder letter to schedule annual appointment.

## 2022-07-01 ENCOUNTER — Other Ambulatory Visit: Payer: Self-pay

## 2022-07-01 ENCOUNTER — Other Ambulatory Visit (HOSPITAL_COMMUNITY): Payer: Self-pay

## 2022-07-01 ENCOUNTER — Other Ambulatory Visit: Payer: Self-pay | Admitting: Student

## 2022-07-01 DIAGNOSIS — E1142 Type 2 diabetes mellitus with diabetic polyneuropathy: Secondary | ICD-10-CM

## 2022-07-02 ENCOUNTER — Other Ambulatory Visit: Payer: Self-pay | Admitting: *Deleted

## 2022-07-02 ENCOUNTER — Other Ambulatory Visit (HOSPITAL_COMMUNITY): Payer: Self-pay

## 2022-07-02 DIAGNOSIS — E1142 Type 2 diabetes mellitus with diabetic polyneuropathy: Secondary | ICD-10-CM

## 2022-07-02 MED ORDER — FREESTYLE LIBRE 3 SENSOR MISC
1.0000 | Freq: Once | 0 refills | Status: AC
  Filled 2022-07-02: qty 2, 28d supply, fill #0

## 2022-07-02 MED ORDER — TIRZEPATIDE 12.5 MG/0.5ML ~~LOC~~ SOAJ
12.5000 mg | SUBCUTANEOUS | 0 refills | Status: DC
  Filled 2022-07-02: qty 2, 28d supply, fill #0

## 2022-07-02 MED ORDER — TIRZEPATIDE 15 MG/0.5ML ~~LOC~~ SOAJ
15.0000 mg | SUBCUTANEOUS | 3 refills | Status: DC
  Filled 2022-07-02 – 2022-07-09 (×3): qty 2, 28d supply, fill #0

## 2022-07-02 NOTE — Addendum Note (Signed)
Addended by: Crissie Sickles on: 07/02/2022 06:56 PM   Modules accepted: Orders

## 2022-07-03 ENCOUNTER — Other Ambulatory Visit: Payer: Self-pay | Admitting: Student

## 2022-07-03 ENCOUNTER — Other Ambulatory Visit: Payer: Self-pay

## 2022-07-03 ENCOUNTER — Encounter: Payer: Self-pay | Admitting: Student

## 2022-07-03 ENCOUNTER — Other Ambulatory Visit (HOSPITAL_COMMUNITY): Payer: Self-pay

## 2022-07-03 DIAGNOSIS — Z794 Long term (current) use of insulin: Secondary | ICD-10-CM

## 2022-07-03 DIAGNOSIS — E1142 Type 2 diabetes mellitus with diabetic polyneuropathy: Secondary | ICD-10-CM

## 2022-07-03 MED ORDER — INSULIN GLARGINE 100 UNIT/ML ~~LOC~~ SOLN
SUBCUTANEOUS | 2 refills | Status: DC
  Filled 2022-07-03: qty 40, 29d supply, fill #0
  Filled 2022-07-31: qty 40, 29d supply, fill #1
  Filled 2022-08-29: qty 40, 29d supply, fill #2

## 2022-07-03 MED ORDER — INSULIN GLARGINE 100 UNIT/ML ~~LOC~~ SOLN
SUBCUTANEOUS | 2 refills | Status: DC
  Filled 2022-07-03: qty 50, 36d supply, fill #0

## 2022-07-04 ENCOUNTER — Other Ambulatory Visit: Payer: Self-pay | Admitting: Student

## 2022-07-04 ENCOUNTER — Other Ambulatory Visit (HOSPITAL_COMMUNITY): Payer: Self-pay

## 2022-07-06 ENCOUNTER — Other Ambulatory Visit: Payer: Self-pay

## 2022-07-06 ENCOUNTER — Other Ambulatory Visit: Payer: Self-pay | Admitting: Student

## 2022-07-06 ENCOUNTER — Other Ambulatory Visit (HOSPITAL_COMMUNITY): Payer: Self-pay

## 2022-07-06 MED ORDER — "INSULIN SYRINGE-NEEDLE U-100 30G X 5/16"" 1 ML MISC"
1.0000 | Freq: Once | 0 refills | Status: DC
  Filled 2022-07-06: qty 100, 50d supply, fill #0

## 2022-07-07 ENCOUNTER — Other Ambulatory Visit (HOSPITAL_COMMUNITY): Payer: Self-pay

## 2022-07-08 ENCOUNTER — Other Ambulatory Visit: Payer: Self-pay

## 2022-07-08 ENCOUNTER — Other Ambulatory Visit (HOSPITAL_COMMUNITY): Payer: Self-pay

## 2022-07-09 ENCOUNTER — Other Ambulatory Visit: Payer: Self-pay

## 2022-07-09 ENCOUNTER — Other Ambulatory Visit (HOSPITAL_COMMUNITY): Payer: Self-pay

## 2022-07-09 ENCOUNTER — Telehealth: Payer: Self-pay

## 2022-07-09 NOTE — Telephone Encounter (Signed)
RTC to patient needs to get Insulin Syringes ordered.  Also would like to get her Mounjaro reordered last taken was the [facility] .  Please send to [facility]. Patient would like to pick up today.

## 2022-07-09 NOTE — Telephone Encounter (Signed)
Pt would like Rx for insulin syringe order. Please call pt back.

## 2022-07-16 ENCOUNTER — Other Ambulatory Visit (HOSPITAL_COMMUNITY): Payer: Self-pay

## 2022-07-17 ENCOUNTER — Other Ambulatory Visit (HOSPITAL_COMMUNITY): Payer: Self-pay

## 2022-07-17 ENCOUNTER — Other Ambulatory Visit: Payer: Self-pay | Admitting: Student

## 2022-07-17 DIAGNOSIS — E1142 Type 2 diabetes mellitus with diabetic polyneuropathy: Secondary | ICD-10-CM

## 2022-07-17 MED ORDER — "INSULIN SYRINGE 28G X 1/2"" 0.5 ML MISC"
2 refills | Status: DC
  Filled 2022-07-17: qty 1, 30d supply, fill #0
  Filled 2022-07-21: qty 1, 1d supply, fill #0
  Filled 2022-07-28: qty 1, fill #0
  Filled 2022-07-31 – 2022-08-23 (×5): qty 100, 50d supply, fill #0

## 2022-07-17 MED ORDER — TIRZEPATIDE 15 MG/0.5ML ~~LOC~~ SOAJ
15.0000 mg | SUBCUTANEOUS | 3 refills | Status: DC
Start: 2022-07-17 — End: 2022-11-05
  Filled 2022-07-17: qty 6, 84d supply, fill #0
  Filled 2022-08-09 – 2022-08-19 (×2): qty 2, 28d supply, fill #0
  Filled 2022-09-11: qty 2, 28d supply, fill #1
  Filled 2022-09-22 – 2022-10-15 (×2): qty 2, 28d supply, fill #0
  Filled 2022-10-15: qty 2, 28d supply, fill #1

## 2022-07-18 ENCOUNTER — Other Ambulatory Visit (HOSPITAL_COMMUNITY): Payer: Self-pay

## 2022-07-21 ENCOUNTER — Other Ambulatory Visit: Payer: Self-pay

## 2022-07-21 ENCOUNTER — Other Ambulatory Visit (HOSPITAL_COMMUNITY): Payer: Self-pay

## 2022-07-22 ENCOUNTER — Other Ambulatory Visit (HOSPITAL_COMMUNITY): Payer: Self-pay

## 2022-07-25 ENCOUNTER — Other Ambulatory Visit (HOSPITAL_COMMUNITY): Payer: Self-pay

## 2022-07-28 ENCOUNTER — Other Ambulatory Visit: Payer: Self-pay

## 2022-07-31 ENCOUNTER — Other Ambulatory Visit: Payer: Self-pay

## 2022-08-09 ENCOUNTER — Encounter: Payer: Self-pay | Admitting: Student

## 2022-08-10 ENCOUNTER — Other Ambulatory Visit: Payer: Self-pay

## 2022-08-10 ENCOUNTER — Other Ambulatory Visit: Payer: Self-pay | Admitting: Student

## 2022-08-10 ENCOUNTER — Other Ambulatory Visit (HOSPITAL_COMMUNITY): Payer: Self-pay

## 2022-08-10 MED ORDER — FREESTYLE LIBRE 3 SENSOR MISC
1.0000 | Freq: Once | 0 refills | Status: AC
  Filled 2022-08-10: qty 2, 28d supply, fill #0

## 2022-08-10 NOTE — Telephone Encounter (Signed)
Freestyle Libre 2 in on current med list; pt is requesting Libre 3.

## 2022-08-11 ENCOUNTER — Other Ambulatory Visit (HOSPITAL_COMMUNITY): Payer: Self-pay

## 2022-08-15 ENCOUNTER — Other Ambulatory Visit (HOSPITAL_COMMUNITY): Payer: Self-pay

## 2022-08-18 ENCOUNTER — Other Ambulatory Visit (HOSPITAL_COMMUNITY): Payer: Self-pay

## 2022-08-19 ENCOUNTER — Other Ambulatory Visit (HOSPITAL_COMMUNITY): Payer: Self-pay

## 2022-08-21 ENCOUNTER — Other Ambulatory Visit: Payer: Self-pay

## 2022-08-24 ENCOUNTER — Other Ambulatory Visit: Payer: Self-pay

## 2022-08-24 ENCOUNTER — Other Ambulatory Visit (HOSPITAL_COMMUNITY): Payer: Self-pay

## 2022-08-24 ENCOUNTER — Other Ambulatory Visit: Payer: Self-pay | Admitting: Student

## 2022-08-24 DIAGNOSIS — F411 Generalized anxiety disorder: Secondary | ICD-10-CM

## 2022-08-25 ENCOUNTER — Other Ambulatory Visit (HOSPITAL_COMMUNITY): Payer: Self-pay

## 2022-08-25 ENCOUNTER — Encounter: Payer: Self-pay | Admitting: *Deleted

## 2022-08-25 ENCOUNTER — Other Ambulatory Visit: Payer: Self-pay

## 2022-08-26 ENCOUNTER — Other Ambulatory Visit (HOSPITAL_COMMUNITY): Payer: Self-pay

## 2022-08-26 ENCOUNTER — Other Ambulatory Visit: Payer: Self-pay

## 2022-08-26 MED ORDER — DULOXETINE HCL 60 MG PO CPEP
60.0000 mg | ORAL_CAPSULE | Freq: Every day | ORAL | 5 refills | Status: DC
Start: 2022-08-26 — End: 2023-02-15
  Filled 2022-08-26: qty 30, 30d supply, fill #0
  Filled 2022-09-25: qty 30, 30d supply, fill #1
  Filled 2022-10-24: qty 30, 30d supply, fill #2
  Filled 2022-11-22: qty 30, 30d supply, fill #3
  Filled 2022-12-21: qty 30, 30d supply, fill #4
  Filled 2023-01-20: qty 30, 30d supply, fill #5

## 2022-08-27 ENCOUNTER — Other Ambulatory Visit: Payer: Self-pay

## 2022-08-31 ENCOUNTER — Other Ambulatory Visit: Payer: Self-pay

## 2022-09-11 ENCOUNTER — Other Ambulatory Visit (HOSPITAL_COMMUNITY): Payer: Self-pay

## 2022-09-15 ENCOUNTER — Other Ambulatory Visit: Payer: Self-pay | Admitting: Student

## 2022-09-15 ENCOUNTER — Other Ambulatory Visit: Payer: Self-pay

## 2022-09-15 ENCOUNTER — Other Ambulatory Visit (HOSPITAL_COMMUNITY): Payer: Self-pay

## 2022-09-15 MED ORDER — INSULIN GLARGINE 100 UNIT/ML ~~LOC~~ SOLN
SUBCUTANEOUS | 2 refills | Status: DC
  Filled 2022-09-15 – 2022-09-25 (×2): qty 40, 29d supply, fill #0

## 2022-09-16 ENCOUNTER — Other Ambulatory Visit (HOSPITAL_COMMUNITY): Payer: Self-pay

## 2022-09-22 ENCOUNTER — Other Ambulatory Visit: Payer: Self-pay

## 2022-09-22 ENCOUNTER — Other Ambulatory Visit (HOSPITAL_BASED_OUTPATIENT_CLINIC_OR_DEPARTMENT_OTHER): Payer: Self-pay

## 2022-09-22 ENCOUNTER — Other Ambulatory Visit (HOSPITAL_COMMUNITY): Payer: Self-pay

## 2022-09-25 ENCOUNTER — Other Ambulatory Visit: Payer: Self-pay | Admitting: Student

## 2022-09-25 ENCOUNTER — Other Ambulatory Visit: Payer: Self-pay

## 2022-09-25 ENCOUNTER — Other Ambulatory Visit (HOSPITAL_COMMUNITY): Payer: Self-pay

## 2022-09-25 DIAGNOSIS — E1142 Type 2 diabetes mellitus with diabetic polyneuropathy: Secondary | ICD-10-CM

## 2022-09-26 ENCOUNTER — Other Ambulatory Visit (HOSPITAL_COMMUNITY): Payer: Self-pay

## 2022-09-26 ENCOUNTER — Encounter: Payer: Self-pay | Admitting: Student

## 2022-09-28 ENCOUNTER — Other Ambulatory Visit (HOSPITAL_COMMUNITY): Payer: Self-pay

## 2022-09-28 ENCOUNTER — Other Ambulatory Visit: Payer: Self-pay | Admitting: Dietician

## 2022-09-28 DIAGNOSIS — Z794 Long term (current) use of insulin: Secondary | ICD-10-CM

## 2022-09-28 MED ORDER — METFORMIN HCL 1000 MG PO TABS
1000.0000 mg | ORAL_TABLET | Freq: Two times a day (BID) | ORAL | 3 refills | Status: DC
Start: 2022-09-28 — End: 2023-01-18
  Filled 2022-09-28: qty 60, 30d supply, fill #0
  Filled 2022-10-24: qty 60, 30d supply, fill #1
  Filled 2022-11-23: qty 60, 30d supply, fill #2
  Filled 2022-12-23: qty 60, 30d supply, fill #3

## 2022-09-28 MED ORDER — INSULIN SYRINGE-NEEDLE U-100 31G X 15/64" 1 ML MISC
3 refills | Status: DC
Start: 2022-09-28 — End: 2023-11-19
  Filled 2022-09-28: qty 100, 50d supply, fill #0
  Filled 2022-09-30: qty 200, 100d supply, fill #0
  Filled 2022-09-30: qty 200, fill #0
  Filled 2022-10-03: qty 200, 90d supply, fill #0
  Filled 2023-01-02 – 2023-04-07 (×2): qty 200, 90d supply, fill #1
  Filled 2023-08-02: qty 200, 90d supply, fill #2

## 2022-09-28 NOTE — Progress Notes (Addendum)
Syringe refill for 100 units daily.

## 2022-09-28 NOTE — Progress Notes (Signed)
Syringe order refilled and sent to [facility].

## 2022-09-29 ENCOUNTER — Other Ambulatory Visit (HOSPITAL_COMMUNITY): Payer: Self-pay

## 2022-09-29 ENCOUNTER — Other Ambulatory Visit: Payer: Self-pay

## 2022-09-30 ENCOUNTER — Other Ambulatory Visit: Payer: Self-pay

## 2022-09-30 ENCOUNTER — Encounter: Payer: Self-pay | Admitting: Obstetrics & Gynecology

## 2022-09-30 ENCOUNTER — Other Ambulatory Visit: Payer: Self-pay | Admitting: Oncology

## 2022-09-30 DIAGNOSIS — Z006 Encounter for examination for normal comparison and control in clinical research program: Secondary | ICD-10-CM

## 2022-10-01 ENCOUNTER — Other Ambulatory Visit: Payer: Self-pay

## 2022-10-01 NOTE — Telephone Encounter (Signed)
Pt was called to schedule f/u appt -pt's agreeable. Call transferred to the front office. Appt schedule on 7/23 with Dr Sloan Leiter @ 1545PM.

## 2022-10-02 ENCOUNTER — Other Ambulatory Visit (HOSPITAL_COMMUNITY): Payer: Self-pay

## 2022-10-02 MED ORDER — INSULIN GLARGINE 100 UNIT/ML ~~LOC~~ SOLN
SUBCUTANEOUS | 2 refills | Status: DC
  Filled 2022-10-02: qty 40, 29d supply, fill #0

## 2022-10-03 ENCOUNTER — Other Ambulatory Visit (HOSPITAL_COMMUNITY): Payer: Self-pay

## 2022-10-07 ENCOUNTER — Other Ambulatory Visit: Payer: Self-pay

## 2022-10-07 ENCOUNTER — Other Ambulatory Visit: Payer: Self-pay | Admitting: Student

## 2022-10-07 DIAGNOSIS — Z794 Long term (current) use of insulin: Secondary | ICD-10-CM

## 2022-10-07 MED ORDER — INSULIN GLARGINE-YFGN 100 UNIT/ML ~~LOC~~ SOPN
PEN_INJECTOR | SUBCUTANEOUS | 2 refills | Status: DC
  Filled 2022-10-07: qty 42, 30d supply, fill #0

## 2022-10-08 ENCOUNTER — Other Ambulatory Visit: Payer: Self-pay

## 2022-10-08 ENCOUNTER — Other Ambulatory Visit (HOSPITAL_COMMUNITY): Payer: Self-pay

## 2022-10-09 ENCOUNTER — Other Ambulatory Visit: Payer: Self-pay

## 2022-10-13 ENCOUNTER — Ambulatory Visit: Payer: 59 | Admitting: Student

## 2022-10-13 ENCOUNTER — Encounter: Payer: Self-pay | Admitting: Student

## 2022-10-13 VITALS — BP 110/58 | HR 72 | Temp 98.1°F | Ht 65.0 in | Wt 237.3 lb

## 2022-10-13 DIAGNOSIS — F411 Generalized anxiety disorder: Secondary | ICD-10-CM

## 2022-10-13 DIAGNOSIS — Z7984 Long term (current) use of oral hypoglycemic drugs: Secondary | ICD-10-CM

## 2022-10-13 DIAGNOSIS — E1159 Type 2 diabetes mellitus with other circulatory complications: Secondary | ICD-10-CM

## 2022-10-13 DIAGNOSIS — E1142 Type 2 diabetes mellitus with diabetic polyneuropathy: Secondary | ICD-10-CM

## 2022-10-13 DIAGNOSIS — Z7985 Long-term (current) use of injectable non-insulin antidiabetic drugs: Secondary | ICD-10-CM

## 2022-10-13 DIAGNOSIS — I152 Hypertension secondary to endocrine disorders: Secondary | ICD-10-CM

## 2022-10-13 DIAGNOSIS — Z794 Long term (current) use of insulin: Secondary | ICD-10-CM | POA: Diagnosis not present

## 2022-10-13 DIAGNOSIS — Z0000 Encounter for general adult medical examination without abnormal findings: Secondary | ICD-10-CM

## 2022-10-13 NOTE — Progress Notes (Signed)
Subjective:  Kelly Gallagher is a 52 y.o. who presents to clinic for the following:  Follow-up for diabetes.  Review of Systems  Constitutional:  Negative for chills, fever and weight loss.  Respiratory:  Negative for cough and shortness of breath.   Psychiatric/Behavioral:  Negative for depression.    Current Outpatient Medications on File Prior to Visit  Medication Sig Dispense Refill   atorvastatin (LIPITOR) 20 MG tablet Take 1 tablet (20 mg total) by mouth daily. 90 tablet 3   Cholecalciferol (VITAMIN D3) 10 MCG (400 UNIT) tablet daily.     Continuous Blood Gluc Receiver (FREESTYLE LIBRE 2 READER SYSTM) DEVI 1 each by Does not apply route 4 (four) times daily. 1 each 0   DULoxetine (CYMBALTA) 60 MG capsule Take 1 capsule (60 mg total) by mouth daily. 30 capsule 5   insulin glargine-yfgn (SEMGLEE, YFGN,) 100 UNIT/ML Pen Inject 70 Units into the skin in the morning AND 70 Units every evening. 42 mL 2   Insulin Pen Needle 32G X 4 MM MISC Used to inject insulin twice dailys 200 each 3   Insulin Syringe-Needle U-100 31G X 15/64" 1 ML MISC Use to inject insulin twice daily 200 each 3   levonorgestrel (MIRENA, 52 MG,) 20 MCG/DAY IUD      loratadine (CLARITIN) 10 MG tablet Take 10 mg by mouth daily.     metFORMIN (GLUCOPHAGE) 1000 MG tablet Take 1 tablet (1,000 mg total) by mouth 2 (two) times daily with a meal. 60 tablet 3   metoprolol succinate (TOPROL-XL) 100 MG 24 hr tablet TAKE 1 TABLET BY MOUTH DAILY WITH OR IMMEDIATELY FOLLOWING A MEAL 90 tablet 3   Multiple Vitamin (MULTIVITAMIN) tablet Take 1 tablet by mouth daily.     olmesartan-hydrochlorothiazide (BENICAR HCT) 20-12.5 MG tablet Take 1 tablet by mouth daily. 90 tablet 3   Omega-3 Fatty Acids (FISH OIL) 1000 MG CAPS Take 1 capsule (1,000 mg total) by mouth daily. 90 capsule 0   omeprazole (PRILOSEC) 20 MG capsule Take 1 capsule (20 mg total) by mouth daily. 90 capsule 3   Probiotic Product (PROBIOTIC-10 PO) Take by mouth.      tirzepatide (MOUNJARO) 15 MG/0.5ML Pen Inject 15 mg into the skin once a week. 6 mL 3   vitamin B-12 (CYANOCOBALAMIN) 1000 MCG tablet Take by mouth.     No current facility-administered medications on file prior to visit.   Objective:   Vitals:   10/13/22 1608  BP: (!) 110/58  Pulse: 72  Temp: 98.1 F (36.7 C)  TempSrc: Oral  SpO2: 99%  Weight: 237 lb 4.8 oz (107.6 kg)  Height: 5\' 5"  (1.651 m)    Physical Exam Well-appearing, no distress Heart rate and rhythm normal, strong radial pulses Breathing normally Skin warm and dry Alert and oriented, speech normal sounding, no facial asymmetry, gait observed is normal Pleasant and concordant affect  Assessment & Plan:   Problem List Items Addressed This Visit     Type 2 diabetes mellitus, with long-term current use of insulin (HCC) - Primary (Chronic)    Mounjaro 15 mg weekly, metformin 1000 mg BID, insulin glargine 70 units BID. Sometimes has trouble getting Mounjaro due to pharmacy-side supply issues and notes elevated blood sugars during her time without this medicine. Insulin therapy is burdensome as her insurance does not cover enough pens to last a month so she uses combination of pens and vials of long-acting insulin. Based on CGM report there is room for improvement  still. Kelly Gallagher wore the CGM for 14 days. The average reading was 176, % time in target was 53, % time below target was 0, and % time above target was 47. Will see what kind of cost she would incur with Tresiba 200 units/mL, switching to this formulation would make room for increases if necessary. For [name] supply issues, she will call and we can prescribe 7.5 mg as bridge (she can dose twice for total dose of 15 mg if quantity prescribed allows). A1c today.  Update 10/15/22 10:08 AM Going to try to use manufacturer coupon for discount on Tresiba U-200. Increase insulin to 80 units BID. Return in 2-4 weeks for download of CGM data. Patient called with these  instructions.      Relevant Orders   Hemoglobin A1c (Completed)   Hypertension associated with diabetes (HCC) (Chronic)    110/58. No orthostatic symptoms. Continue metop succinate 100 mg and olmesartan-hydrochlorothiazide 20-12.5. BMP today.      Relevant Orders   BMP8+Anion Gap (Completed)   Generalized anxiety disorder (Chronic)    Mood stable for last few weeks. Recently went through a tough time caring for her dad during which she felt down, took poor care of herself, didn't want to get out of bed, but this was transient episode and she has since recovered. No GAD-7 today. PHQ-9 stable. Continue duloxetine 60 mg daily.     10/13/2022    4:07 PM 06/04/2022    9:45 AM 03/05/2022   10:20 AM  PHQ9 SCORE ONLY  PHQ-9 Total Score 0 5 0         Healthcare maintenance    Medication reconciliation completed: yes Medications present with patient: no Barriers to med rec: none Patient reminded to bring medications to all doctor visits.  Foot exam at follow-up.       Return in 2-4 weeks for CGM download and insulin titration.  Patient discussed with Dr. Michel Bickers MD 10/15/2022, 10:10 AM  [phone]

## 2022-10-13 NOTE — Assessment & Plan Note (Signed)
110/58. No orthostatic symptoms. Continue metop succinate 100 mg and olmesartan-hydrochlorothiazide 20-12.5. BMP today.

## 2022-10-13 NOTE — Assessment & Plan Note (Signed)
Mood stable for last few weeks. Recently went through a tough time caring for her dad during which she felt down, took poor care of herself, didn't want to get out of bed, but this was transient episode and she has since recovered. No GAD-7 today. PHQ-9 stable. Continue duloxetine 60 mg daily.     10/13/2022    4:07 PM 06/04/2022    9:45 AM 03/05/2022   10:20 AM  PHQ9 SCORE ONLY  PHQ-9 Total Score 0 5 0

## 2022-10-13 NOTE — Assessment & Plan Note (Addendum)
Mounjaro 15 mg weekly, metformin 1000 mg BID, insulin glargine 70 units BID. Sometimes has trouble getting Mounjaro due to pharmacy-side supply issues and notes elevated blood sugars during her time without this medicine. Insulin therapy is burdensome as her insurance does not cover enough pens to last a month so she uses combination of pens and vials of long-acting insulin. Based on CGM report there is room for improvement still. Kelly Gallagher wore the CGM for 14 days. The average reading was 176, % time in target was 53, % time below target was 0, and % time above target was 47. Will see what kind of cost she would incur with Tresiba 200 units/mL, switching to this formulation would make room for increases if necessary. For [facility] supply issues, she will call and we can prescribe 7.5 mg as bridge (she can dose twice for total dose of 15 mg if quantity prescribed allows). A1c today.  Update 10/15/22 10:08 AM Going to try to use manufacturer coupon for discount on Tresiba U-200. Increase insulin to 80 units BID. Return in 2-4 weeks for download of CGM data. Patient called with these instructions.

## 2022-10-13 NOTE — Assessment & Plan Note (Signed)
Medication reconciliation completed: yes Medications present with patient: no Barriers to med rec: none Patient reminded to bring medications to all doctor visits.  Foot exam at follow-up.

## 2022-10-13 NOTE — Patient Instructions (Signed)
Remember to bring all of the medications that you take (including over the counter medications and supplements) with you to every clinic visit.  This after visit summary is an important review of tests, referrals, and medication changes that were discussed during your visit. If you have questions or concerns, call [phone]. Outside of clinic business hours, call the main hospital at [phone] and ask the operator for the on-call internal medicine resident.   Marrianne Mood MD 10/13/2022, 4:36 PM

## 2022-10-14 ENCOUNTER — Telehealth: Payer: Self-pay

## 2022-10-14 ENCOUNTER — Other Ambulatory Visit (HOSPITAL_COMMUNITY): Payer: Self-pay

## 2022-10-14 LAB — BMP8+ANION GAP
Anion Gap: 20 mmol/L — ABNORMAL HIGH (ref 10.0–18.0)
BUN/Creatinine Ratio: 15 (ref 9–23)
BUN: 10 mg/dL (ref 6–24)
CO2: 22 mmol/L (ref 20–29)
Calcium: 10.2 mg/dL (ref 8.7–10.2)
Chloride: 95 mmol/L — ABNORMAL LOW (ref 96–106)
Creatinine, Ser: 0.65 mg/dL (ref 0.57–1.00)
Glucose: 116 mg/dL — ABNORMAL HIGH (ref 70–99)
Potassium: 4.5 mmol/L (ref 3.5–5.2)
Sodium: 137 mmol/L (ref 134–144)
eGFR: 107 mL/min/{1.73_m2} (ref >59)

## 2022-10-14 LAB — HEMOGLOBIN A1C
Est. average glucose Bld gHb Est-mCnc: 206 mg/dL
Hgb A1c MFr Bld: 8.8 % — ABNORMAL HIGH (ref 4.8–5.6)

## 2022-10-14 NOTE — Telephone Encounter (Signed)
-----   Message from Marrianne Mood sent at 10/13/2022  4:54 PM EDT ----- Regarding: test claim tresiba 200 unit/mL Hi Lukis Bunt,  Can you run a test claim for the Tresiba 200 unit/mL for this person?  Thanks, Marrianne Mood MD 10/13/2022, 4:56 PM

## 2022-10-14 NOTE — Telephone Encounter (Signed)
Pharmacy Patient Advocate Encounter  Insurance verification completed.    The patient is insured through [facility]   Ran test claim for TRESIBA U200. Currently a quantity of (1 BOX - 3 PENS) co-pay is $113.15 (FOR 30 DAYS. DAY SUPPLY MAY VARY DEPENDING ON DIRECTIONS) .  This test claim was processed through [facility]- copay amounts may vary at other pharmacies due to pharmacy/plan contracts, or as the patient moves through the different stages of their insurance plan.

## 2022-10-15 ENCOUNTER — Encounter: Payer: Self-pay | Admitting: Student

## 2022-10-15 ENCOUNTER — Other Ambulatory Visit (HOSPITAL_COMMUNITY): Payer: Self-pay

## 2022-10-15 ENCOUNTER — Other Ambulatory Visit: Payer: Self-pay

## 2022-10-15 MED ORDER — TRESIBA FLEXTOUCH 200 UNIT/ML ~~LOC~~ SOPN
80.0000 [IU] | PEN_INJECTOR | Freq: Two times a day (BID) | SUBCUTANEOUS | 5 refills | Status: DC
  Filled 2022-10-15: qty 24, 30d supply, fill #0
  Filled 2022-10-15: qty 15, 18d supply, fill #0
  Filled 2022-11-09: qty 24, 30d supply, fill #1
  Filled 2022-12-09: qty 24, 30d supply, fill #2
  Filled 2023-01-06 – 2023-01-12 (×2): qty 18, 23d supply, fill #3

## 2022-10-15 MED ORDER — TRESIBA FLEXTOUCH 200 UNIT/ML ~~LOC~~ SOPN
80.0000 [IU] | PEN_INJECTOR | Freq: Two times a day (BID) | SUBCUTANEOUS | 5 refills | Status: DC
Start: 2022-10-15 — End: 2022-10-15
  Filled 2022-10-15: qty 3, fill #0

## 2022-10-15 NOTE — Addendum Note (Signed)
Addended by: Marrianne Mood on: 10/15/2022 10:21 AM   Modules accepted: Orders

## 2022-10-15 NOTE — Progress Notes (Signed)
Internal Medicine Clinic Attending  Case discussed with the resident at the time of the visit.  We reviewed the resident's history and exam and pertinent patient test results.  I agree with the assessment, diagnosis, and plan of care documented in the resident's note.    A1C resulted at 8.8, which is higher than Dr Benito Mccreedy and I were expecting. He will discuss increasing insulin with patient and we will see her back in 1 month rather than 3-6 months.

## 2022-10-15 NOTE — Addendum Note (Signed)
Addended by: Marrianne Mood on: 10/15/2022 10:25 AM   Modules accepted: Orders

## 2022-10-16 ENCOUNTER — Other Ambulatory Visit (HOSPITAL_COMMUNITY): Payer: Self-pay

## 2022-10-16 ENCOUNTER — Other Ambulatory Visit: Payer: Self-pay | Admitting: Student

## 2022-10-23 ENCOUNTER — Encounter: Payer: Self-pay | Admitting: Student

## 2022-10-24 ENCOUNTER — Other Ambulatory Visit (HOSPITAL_COMMUNITY): Payer: Self-pay

## 2022-10-26 ENCOUNTER — Other Ambulatory Visit: Payer: Self-pay

## 2022-10-26 ENCOUNTER — Other Ambulatory Visit (HOSPITAL_COMMUNITY): Payer: Self-pay

## 2022-10-26 MED ORDER — FREESTYLE LIBRE 3 SENSOR MISC
1.0000 [IU] | 11 refills | Status: DC
  Filled 2022-10-26: qty 2, 28d supply, fill #0
  Filled 2022-11-18: qty 2, 28d supply, fill #1
  Filled 2022-12-16: qty 2, 28d supply, fill #2
  Filled 2023-01-13: qty 2, 28d supply, fill #3
  Filled 2023-02-10: qty 2, 28d supply, fill #4
  Filled 2023-03-10: qty 2, 28d supply, fill #5
  Filled 2023-04-07: qty 2, 28d supply, fill #6
  Filled 2023-05-01: qty 2, 28d supply, fill #7
  Filled 2023-05-28: qty 2, 28d supply, fill #8
  Filled 2023-06-25: qty 2, 28d supply, fill #9
  Filled 2023-07-21: qty 2, 28d supply, fill #10
  Filled 2023-08-17: qty 2, 28d supply, fill #11

## 2022-10-28 ENCOUNTER — Ambulatory Visit: Payer: 59 | Admitting: Obstetrics & Gynecology

## 2022-11-05 ENCOUNTER — Ambulatory Visit (INDEPENDENT_AMBULATORY_CARE_PROVIDER_SITE_OTHER): Payer: 59 | Admitting: Student

## 2022-11-05 ENCOUNTER — Other Ambulatory Visit (HOSPITAL_BASED_OUTPATIENT_CLINIC_OR_DEPARTMENT_OTHER): Payer: Self-pay

## 2022-11-05 ENCOUNTER — Encounter: Payer: Self-pay | Admitting: Student

## 2022-11-05 DIAGNOSIS — Z7985 Long-term (current) use of injectable non-insulin antidiabetic drugs: Secondary | ICD-10-CM | POA: Diagnosis not present

## 2022-11-05 DIAGNOSIS — E1142 Type 2 diabetes mellitus with diabetic polyneuropathy: Secondary | ICD-10-CM

## 2022-11-05 DIAGNOSIS — Z794 Long term (current) use of insulin: Secondary | ICD-10-CM | POA: Diagnosis not present

## 2022-11-05 MED ORDER — TIRZEPATIDE 15 MG/0.5ML ~~LOC~~ SOAJ
15.0000 mg | SUBCUTANEOUS | 3 refills | Status: DC
Start: 2022-11-05 — End: 2023-05-14
  Filled 2022-11-05 – 2022-11-12 (×2): qty 6, 84d supply, fill #0
  Filled 2023-02-02: qty 6, 84d supply, fill #1
  Filled 2023-04-28: qty 2, 28d supply, fill #2

## 2022-11-07 NOTE — Progress Notes (Signed)
CC: Follow-up  HPI:  [name] is a 52 y.o. female living with a history stated below and presents today for a follow-up. Please see problem based assessment and plan for additional details.  Past Medical History:  Diagnosis Date   Allergy 12/30/2020   seasonal   Anxiety 12/30/2020   Diabetes mellitus without complication (HCC)    GERD (gastroesophageal reflux disease)    Hypertension     Current Outpatient Medications on File Prior to Visit  Medication Sig Dispense Refill   atorvastatin (LIPITOR) 20 MG tablet Take 1 tablet (20 mg total) by mouth daily. 90 tablet 3   Cholecalciferol (VITAMIN D3) 10 MCG (400 UNIT) tablet daily.     Continuous Blood Gluc Receiver (FREESTYLE LIBRE 2 READER SYSTM) DEVI 1 each by Does not apply route 4 (four) times daily. 1 each 0   Continuous Glucose Sensor (FREESTYLE LIBRE 3 SENSOR) MISC Place 1 sensor on the skin every 14 days. Use to check glucose continuously 2 each 11   DULoxetine (CYMBALTA) 60 MG capsule Take 1 capsule (60 mg total) by mouth daily. 30 capsule 5   insulin degludec (TRESIBA FLEXTOUCH) 200 UNIT/ML FlexTouch Pen Inject 80 Units into the skin in the morning and at bedtime. 15 mL 5   Insulin Pen Needle 32G X 4 MM MISC Used to inject insulin twice dailys 200 each 3   Insulin Syringe-Needle U-100 31G X 15/64" 1 ML MISC Use to inject insulin twice daily 200 each 3   levonorgestrel (MIRENA, 52 MG,) 20 MCG/DAY IUD      loratadine (CLARITIN) 10 MG tablet Take 10 mg by mouth daily.     metFORMIN (GLUCOPHAGE) 1000 MG tablet Take 1 tablet (1,000 mg total) by mouth 2 (two) times daily with a meal. 60 tablet 3   metoprolol succinate (TOPROL-XL) 100 MG 24 hr tablet TAKE 1 TABLET BY MOUTH DAILY WITH OR IMMEDIATELY FOLLOWING A MEAL 90 tablet 3   Multiple Vitamin (MULTIVITAMIN) tablet Take 1 tablet by mouth daily.     olmesartan-hydrochlorothiazide (BENICAR HCT) 20-12.5 MG tablet Take 1 tablet by mouth daily. 90 tablet 3   Omega-3 Fatty Acids  (FISH OIL) 1000 MG CAPS Take 1 capsule (1,000 mg total) by mouth daily. 90 capsule 0   omeprazole (PRILOSEC) 20 MG capsule Take 1 capsule (20 mg total) by mouth daily. 90 capsule 3   Probiotic Product (PROBIOTIC-10 PO) Take by mouth.     vitamin B-12 (CYANOCOBALAMIN) 1000 MCG tablet Take by mouth.     No current facility-administered medications on file prior to visit.    Family History  Problem Relation Age of Onset   Colon polyps Father    Cancer Father        Head and neck cancer. Has PEG   Lupus Sister    Breast cancer Other    Breast cancer Other    Colon cancer Neg Hx    Esophageal cancer Neg Hx    Rectal cancer Neg Hx    Stomach cancer Neg Hx     Social History   Socioeconomic History   Marital status: Married    Spouse name: Not on file   Number of children: Not on file   Years of education: Not on file   Highest education level: Not on file  Occupational History   Not on file  Tobacco Use   Smoking status: Never   Smokeless tobacco: Never  Vaping Use   Vaping status: Never Used  Substance and  Sexual Activity   Alcohol use: Not Currently   Drug use: Never   Sexual activity: Yes    Birth control/protection: I.U.D.  Other Topics Concern   Not on file  Social History Narrative   Not on file   Social Determinants of Health   Financial Resource Strain: Low Risk  (08/24/2018)   Overall Financial Resource Strain (CARDIA)    Difficulty of Paying Living Expenses: Not very hard  Food Insecurity: No Food Insecurity (06/04/2022)   Hunger Vital Sign    Worried About Running Out of Food in the Last Year: Never true    Ran Out of Food in the Last Year: Never true  Transportation Needs: No Transportation Needs (06/04/2022)   PRAPARE - Administrator, Civil Service (Medical): No    Lack of Transportation (Non-Medical): No  Physical Activity: Not on file  Stress: Not on file  Social Connections: Socially Integrated (06/04/2022)   Social Connection and  Isolation Panel [NHANES]    Frequency of Communication with Friends and Family: More than three times a week    Frequency of Social Gatherings with Friends and Family: More than three times a week    Attends Religious Services: More than 4 times per year    Active Member of [employer] or Organizations: Yes    Attends Engineer, structural: More than 4 times per year    Marital Status: Married  Catering manager Violence: Not At Risk (06/04/2022)   Humiliation, Afraid, Rape, and Kick questionnaire    Fear of Current or Ex-Partner: No    Emotionally Abused: No    Physically Abused: No    Sexually Abused: No    Review of Systems: ROS negative except for what is noted on the assessment and plan.  Vitals:   11/05/22 1504  BP: 107/69  Pulse: 73  Temp: 98 F (36.7 C)  TempSrc: Oral  SpO2: 98%  Weight: 236 lb 14.4 oz (107.5 kg)  Height: 5\' 5"  (1.651 m)    Physical Exam: Constitutional: obese, sitting in chair, in no acute distress Cardiovascular: regular rate and rhythm, no m/r/g Pulmonary/Chest: normal work of breathing on room air, lungs clear to auscultation bilaterally Psych: normal mood and behavior  Assessment & Plan:     Patient seen with Dr. Oswaldo Done  Type 2 diabetes mellitus, with long-term current use of insulin (HCC) Patient here for follow-up after 7/23 office visit (A1c measured then was 8.8) in which Semglee (100 u/ml) 70 units BID was discontinued and changed to [place] 200u/ml 80 units BID. Patient continues Metformin 1,000 mg BID and Mounjaro 15 mg/weekly. Notably, due to shortages at current pharmacy, patient unable to use Mounjaro 2 out of 3 weeks since last office visit. Patient brings in CGM from last few weeks and there is overall improvement with 70% within target range (up from 53%) and predicted A1c of 7.5. Overall Spikes happening around breakfast. Patient further counseled on the importance of diet and exercise. Given the difficulty in consistently  obtaining Mounjaro, prescription sent to different pharmacy with better supply. Patient to return in 2 months to re-evaluate CGM and A1c. -Refill Kerney Elbe, D.O. [facility] Internal Medicine, PGY-1 Phone: [phone] Date 11/07/2022 Time 10:27 AM

## 2022-11-07 NOTE — Assessment & Plan Note (Addendum)
Patient here for follow-up after 7/23 office visit (A1c measured then was 8.8) in which Semglee (100 u/ml) 70 units BID was discontinued and changed to Tresiba (200 u/ml) 80 units BID. Patient tolerating change well. Patient continues Metformin 1,000 mg BID and Mounjaro 15 mg/weekly. Notably, due to shortages at current pharmacy, patient unable to use Mounjaro 2 out of 3 weeks since last office visit. Patient brings in CGM from last few weeks and there is overall improvement with 70% within target range (up from 53%) and predicted A1c of 7.5. Overall Spikes happening around breakfast. Patient further counseled on the importance of diet and exercise. Given the difficulty in consistently obtaining Mounjaro, prescription sent to different pharmacy with better supply. Patient to return in 2 months to re-evaluate CGM and A1c. -Refill Mounjaro

## 2022-11-09 ENCOUNTER — Other Ambulatory Visit (HOSPITAL_COMMUNITY): Payer: Self-pay

## 2022-11-09 NOTE — Progress Notes (Signed)
Internal Medicine Clinic Attending  I was physically present during the key portions of the resident provided service and participated in the medical decision making of patient's management care. I reviewed pertinent patient test results.  The assessment, diagnosis, and plan were formulated together and I agree with the documentation in the resident's note.  Erlinda Hong, MD FACP

## 2022-11-10 ENCOUNTER — Other Ambulatory Visit: Payer: Self-pay

## 2022-11-10 ENCOUNTER — Other Ambulatory Visit (HOSPITAL_COMMUNITY): Payer: Self-pay

## 2022-11-12 ENCOUNTER — Other Ambulatory Visit (HOSPITAL_BASED_OUTPATIENT_CLINIC_OR_DEPARTMENT_OTHER): Payer: Self-pay

## 2022-11-18 ENCOUNTER — Other Ambulatory Visit: Payer: Self-pay

## 2022-11-18 ENCOUNTER — Other Ambulatory Visit (HOSPITAL_COMMUNITY): Payer: Self-pay

## 2022-11-18 ENCOUNTER — Other Ambulatory Visit: Payer: Self-pay | Admitting: Student

## 2022-11-18 DIAGNOSIS — I10 Essential (primary) hypertension: Secondary | ICD-10-CM

## 2022-11-22 ENCOUNTER — Other Ambulatory Visit (HOSPITAL_COMMUNITY): Payer: Self-pay

## 2022-11-22 ENCOUNTER — Other Ambulatory Visit: Payer: Self-pay

## 2022-11-23 ENCOUNTER — Other Ambulatory Visit (HOSPITAL_COMMUNITY): Payer: Self-pay

## 2022-11-24 ENCOUNTER — Other Ambulatory Visit: Payer: Self-pay

## 2022-11-25 ENCOUNTER — Other Ambulatory Visit (HOSPITAL_COMMUNITY): Payer: Self-pay

## 2022-12-08 ENCOUNTER — Encounter: Payer: Self-pay | Admitting: Student

## 2022-12-08 ENCOUNTER — Telehealth: Payer: Self-pay

## 2022-12-08 DIAGNOSIS — I10 Essential (primary) hypertension: Secondary | ICD-10-CM

## 2022-12-08 NOTE — Telephone Encounter (Signed)
Left voicemail notifying patient of appointment being changed to Dr. Jolayne Panther. Advised patient to call back to reschedule if she would like to keep it with Dr. Erin Fulling.

## 2022-12-09 ENCOUNTER — Other Ambulatory Visit (HOSPITAL_COMMUNITY): Payer: Self-pay

## 2022-12-09 ENCOUNTER — Encounter: Payer: Self-pay | Admitting: Obstetrics and Gynecology

## 2022-12-09 ENCOUNTER — Ambulatory Visit (INDEPENDENT_AMBULATORY_CARE_PROVIDER_SITE_OTHER): Payer: 59 | Admitting: Obstetrics and Gynecology

## 2022-12-09 VITALS — BP 111/65 | HR 71 | Wt 233.0 lb

## 2022-12-09 DIAGNOSIS — Z30432 Encounter for removal of intrauterine contraceptive device: Secondary | ICD-10-CM | POA: Diagnosis not present

## 2022-12-09 DIAGNOSIS — Z01419 Encounter for gynecological examination (general) (routine) without abnormal findings: Secondary | ICD-10-CM

## 2022-12-09 NOTE — Progress Notes (Signed)
Subjective:     Kelly Gallagher is a 52 y.o. female P0 with BMI 38 who is here for a comprehensive physical exam. The patient reports no problems. She is sexually active without complaints. She denies urinary incontinence or issues with bowel movements. Patient has had her Mirena IUD for 8 years and desires removal. She denies any vasomotor symptoms. She is without any complaints  Past Medical History:  Diagnosis Date   Allergy 12/30/2020   seasonal   Anxiety 12/30/2020   Diabetes mellitus without complication (HCC)    GERD (gastroesophageal reflux disease)    Hypertension    Past Surgical History:  Procedure Laterality Date   CHOLECYSTECTOMY     COLONOSCOPY  01/13/2021   JMP   DILATION AND CURETTAGE OF UTERUS     KNEE ARTHROSCOPY W/ MENISCAL REPAIR Right 05/2010   Family History  Problem Relation Age of Onset   Colon polyps Father    Cancer Father        Head and neck cancer. Has PEG   Lupus Sister    Breast cancer Other    Breast cancer Other    Colon cancer Neg Hx    Esophageal cancer Neg Hx    Rectal cancer Neg Hx    Stomach cancer Neg Hx    Social History   Socioeconomic History   Marital status: Married    Spouse name: Not on file   Number of children: Not on file   Years of education: Not on file   Highest education level: Not on file  Occupational History   Not on file  Tobacco Use   Smoking status: Never   Smokeless tobacco: Never  Vaping Use   Vaping status: Never Used  Substance and Sexual Activity   Alcohol use: Not Currently   Drug use: Never   Sexual activity: Yes    Birth control/protection: I.U.D.  Other Topics Concern   Not on file  Social History Narrative   Not on file   Social Determinants of Health   Financial Resource Strain: Low Risk  (08/24/2018)   Overall Financial Resource Strain (CARDIA)    Difficulty of Paying Living Expenses: Not very hard  Food Insecurity: No Food Insecurity (06/04/2022)   Hunger Vital Sign    Worried About  Running Out of Food in the Last Year: Never true    Ran Out of Food in the Last Year: Never true  Transportation Needs: No Transportation Needs (06/04/2022)   PRAPARE - Administrator, Civil Service (Medical): No    Lack of Transportation (Non-Medical): No  Physical Activity: Not on file  Stress: Not on file  Social Connections: Socially Integrated (06/04/2022)   Social Connection and Isolation Panel [NHANES]    Frequency of Communication with Friends and Family: More than three times a week    Frequency of Social Gatherings with Friends and Family: More than three times a week    Attends Religious Services: More than 4 times per year    Active Member of [employer] or Organizations: Yes    Attends Banker Meetings: More than 4 times per year    Marital Status: Married  Catering manager Violence: Not At Risk (06/04/2022)   Humiliation, Afraid, Rape, and Kick questionnaire    Fear of Current or Ex-Partner: No    Emotionally Abused: No    Physically Abused: No    Sexually Abused: No   Health Maintenance  Topic Date Due   HIV Screening  Never done   Hepatitis C Screening  Never done   DTaP/Tdap/Td (1 - Tdap) Never done   Zoster Vaccines- Shingrix (1 of 2) Never done   FOOT EXAM  08/22/2022   INFLUENZA VACCINE  10/22/2022   COVID-19 Vaccine (5 - 2023-24 season) 11/22/2022   Diabetic kidney evaluation - Urine ACR  03/06/2027 (Originally 03/28/2021)   HEMOGLOBIN A1C  04/15/2023   OPHTHALMOLOGY EXAM  05/26/2023   Diabetic kidney evaluation - eGFR measurement  10/13/2023   MAMMOGRAM  11/26/2023   Cervical Cancer Screening (HPV/Pap Cotest)  08/21/2026   Colonoscopy  01/14/2031   HPV VACCINES  Aged Out       Review of Systems Pertinent items noted in HPI and remainder of comprehensive ROS otherwise negative.   Objective:  Blood pressure 111/65, pulse 71, weight 233 lb (105.7 kg).   GENERAL: Well-developed, well-nourished female in no acute distress.  HEENT:  Normocephalic, atraumatic. Sclerae anicteric.  NECK: Supple. Normal thyroid.  LUNGS: Clear to auscultation bilaterally.  HEART: Regular rate and rhythm. BREASTS: Symmetric in size. No palpable masses or lymphadenopathy, skin changes, or nipple drainage. ABDOMEN: Soft, nontender, nondistended. No organomegaly. PELVIC: Normal external female genitalia. Vagina is pink and rugated.  Normal discharge. Normal appearing cervix with IUD strings visualized at the os. Uterus is normal in size. No adnexal mass or tenderness. Chaperone present during the pelvic exam EXTREMITIES: No cyanosis, clubbing, or edema, 2+ distal pulses.     Assessment:    Healthy female exam.      Plan:    Screening mammogram ordered Patient is current on colonoscopy and pap smear GYNECOLOGY CLINIC PROCEDURE NOTE  IUD Removal  Patient identified, informed consent performed, consent signed.  Patient was in the dorsal lithotomy position, normal external genitalia was noted.  A speculum was placed in the patient's vagina, normal discharge was noted, no lesions. The cervix was visualized, no lesions, no abnormal discharge.  The strings of the IUD were grasped and pulled using ring forceps. The IUD was removed in its entirety. Patient tolerated the procedure well.    Patient to keep a menstrual cycle   See After Visit Summary for Counseling Recommendations

## 2022-12-10 ENCOUNTER — Other Ambulatory Visit: Payer: Self-pay

## 2022-12-10 ENCOUNTER — Other Ambulatory Visit (HOSPITAL_BASED_OUTPATIENT_CLINIC_OR_DEPARTMENT_OTHER): Payer: Self-pay

## 2022-12-10 MED ORDER — METOPROLOL SUCCINATE ER 100 MG PO TB24
100.0000 mg | ORAL_TABLET | Freq: Every day | ORAL | 3 refills | Status: DC
  Filled 2022-12-10: qty 90, 90d supply, fill #0
  Filled 2023-03-05: qty 90, 90d supply, fill #1
  Filled 2023-05-30: qty 90, 90d supply, fill #2
  Filled 2023-09-04: qty 90, 90d supply, fill #3

## 2022-12-16 ENCOUNTER — Other Ambulatory Visit: Payer: Self-pay

## 2022-12-19 ENCOUNTER — Other Ambulatory Visit (HOSPITAL_COMMUNITY): Payer: Self-pay

## 2022-12-21 ENCOUNTER — Other Ambulatory Visit: Payer: Self-pay

## 2022-12-21 ENCOUNTER — Inpatient Hospital Stay (HOSPITAL_BASED_OUTPATIENT_CLINIC_OR_DEPARTMENT_OTHER): Admission: RE | Admit: 2022-12-21 | Payer: 59 | Source: Ambulatory Visit

## 2022-12-21 ENCOUNTER — Other Ambulatory Visit (HOSPITAL_COMMUNITY): Payer: Self-pay

## 2022-12-23 ENCOUNTER — Other Ambulatory Visit (HOSPITAL_COMMUNITY): Payer: Self-pay

## 2022-12-28 ENCOUNTER — Encounter: Payer: Self-pay | Admitting: Student

## 2022-12-31 ENCOUNTER — Other Ambulatory Visit (HOSPITAL_COMMUNITY): Payer: Self-pay

## 2023-01-05 ENCOUNTER — Other Ambulatory Visit (HOSPITAL_COMMUNITY): Payer: Self-pay

## 2023-01-05 ENCOUNTER — Other Ambulatory Visit: Payer: Self-pay

## 2023-01-06 ENCOUNTER — Other Ambulatory Visit: Payer: Self-pay

## 2023-01-06 ENCOUNTER — Other Ambulatory Visit (HOSPITAL_COMMUNITY): Payer: Self-pay

## 2023-01-06 ENCOUNTER — Encounter (HOSPITAL_COMMUNITY): Payer: Self-pay | Admitting: Pharmacist

## 2023-01-06 ENCOUNTER — Other Ambulatory Visit: Payer: Self-pay | Admitting: Student

## 2023-01-06 DIAGNOSIS — Z794 Long term (current) use of insulin: Secondary | ICD-10-CM

## 2023-01-06 NOTE — Telephone Encounter (Signed)
Next appt scheduled 10/22 with Dr Sloan Leiter.

## 2023-01-11 ENCOUNTER — Ambulatory Visit (HOSPITAL_BASED_OUTPATIENT_CLINIC_OR_DEPARTMENT_OTHER): Payer: 59

## 2023-01-11 ENCOUNTER — Other Ambulatory Visit: Payer: Self-pay

## 2023-01-12 ENCOUNTER — Other Ambulatory Visit (HOSPITAL_COMMUNITY): Payer: Self-pay

## 2023-01-12 ENCOUNTER — Telehealth (HOSPITAL_BASED_OUTPATIENT_CLINIC_OR_DEPARTMENT_OTHER): Payer: Self-pay

## 2023-01-12 ENCOUNTER — Other Ambulatory Visit (HOSPITAL_COMMUNITY): Payer: 59 | Attending: Oncology

## 2023-01-12 ENCOUNTER — Other Ambulatory Visit: Payer: Self-pay

## 2023-01-12 ENCOUNTER — Encounter: Payer: 59 | Admitting: Internal Medicine

## 2023-01-12 ENCOUNTER — Encounter: Payer: Self-pay | Admitting: Pharmacist

## 2023-01-13 ENCOUNTER — Other Ambulatory Visit: Payer: Self-pay

## 2023-01-13 ENCOUNTER — Other Ambulatory Visit (HOSPITAL_COMMUNITY): Payer: Self-pay

## 2023-01-14 ENCOUNTER — Other Ambulatory Visit: Payer: Self-pay

## 2023-01-18 ENCOUNTER — Other Ambulatory Visit: Payer: Self-pay | Admitting: Student

## 2023-01-18 DIAGNOSIS — E1142 Type 2 diabetes mellitus with diabetic polyneuropathy: Secondary | ICD-10-CM

## 2023-01-19 ENCOUNTER — Ambulatory Visit (HOSPITAL_BASED_OUTPATIENT_CLINIC_OR_DEPARTMENT_OTHER)
Admission: RE | Admit: 2023-01-19 | Discharge: 2023-01-19 | Disposition: A | Discharge status: Home / Self Care | Payer: 59 | Source: Ambulatory Visit | Attending: Obstetrics and Gynecology | Admitting: Obstetrics and Gynecology

## 2023-01-19 ENCOUNTER — Encounter (HOSPITAL_BASED_OUTPATIENT_CLINIC_OR_DEPARTMENT_OTHER): Payer: Self-pay

## 2023-01-19 ENCOUNTER — Other Ambulatory Visit: Payer: Self-pay

## 2023-01-19 ENCOUNTER — Other Ambulatory Visit (HOSPITAL_COMMUNITY): Payer: Self-pay

## 2023-01-19 DIAGNOSIS — Z1231 Encounter for screening mammogram for malignant neoplasm of breast: Secondary | ICD-10-CM | POA: Insufficient documentation

## 2023-01-19 DIAGNOSIS — Z01419 Encounter for gynecological examination (general) (routine) without abnormal findings: Secondary | ICD-10-CM | POA: Insufficient documentation

## 2023-01-19 MED ORDER — METFORMIN HCL 1000 MG PO TABS
1000.0000 mg | ORAL_TABLET | Freq: Two times a day (BID) | ORAL | 3 refills | Status: DC
Start: 2023-01-19 — End: 2023-05-14
  Filled 2023-01-19: qty 60, 30d supply, fill #0
  Filled 2023-02-17: qty 60, 30d supply, fill #1
  Filled 2023-03-18: qty 60, 30d supply, fill #2
  Filled 2023-04-15: qty 60, 30d supply, fill #3

## 2023-01-20 ENCOUNTER — Other Ambulatory Visit (HOSPITAL_COMMUNITY): Payer: Self-pay

## 2023-01-21 ENCOUNTER — Other Ambulatory Visit: Payer: Self-pay | Admitting: *Deleted

## 2023-01-21 DIAGNOSIS — Z794 Long term (current) use of insulin: Secondary | ICD-10-CM

## 2023-01-21 NOTE — Telephone Encounter (Signed)
Next appt scheduled 11/14 with Dr Rayvon Char.

## 2023-01-22 ENCOUNTER — Other Ambulatory Visit (HOSPITAL_COMMUNITY): Payer: Self-pay

## 2023-01-22 MED ORDER — TRESIBA FLEXTOUCH 200 UNIT/ML ~~LOC~~ SOPN
80.0000 [IU] | PEN_INJECTOR | Freq: Two times a day (BID) | SUBCUTANEOUS | 5 refills | Status: DC
Start: 2023-01-22 — End: 2023-05-14
  Filled 2023-01-22: qty 15, 20d supply, fill #0
  Filled 2023-02-01: qty 24, 31d supply, fill #0
  Filled 2023-03-03: qty 24, 31d supply, fill #1
  Filled 2023-04-05: qty 24, 31d supply, fill #2
  Filled 2023-05-04: qty 18, 23d supply, fill #3

## 2023-02-01 ENCOUNTER — Other Ambulatory Visit: Payer: Self-pay

## 2023-02-01 ENCOUNTER — Other Ambulatory Visit (HOSPITAL_COMMUNITY): Payer: Self-pay

## 2023-02-02 ENCOUNTER — Other Ambulatory Visit (HOSPITAL_BASED_OUTPATIENT_CLINIC_OR_DEPARTMENT_OTHER): Payer: Self-pay

## 2023-02-02 ENCOUNTER — Other Ambulatory Visit: Payer: Self-pay

## 2023-02-04 ENCOUNTER — Encounter: Payer: Self-pay | Admitting: Student

## 2023-02-04 ENCOUNTER — Ambulatory Visit: Payer: 59 | Admitting: Student

## 2023-02-04 VITALS — BP 106/61 | HR 64 | Temp 98.0°F | Ht 65.0 in | Wt 239.3 lb

## 2023-02-04 DIAGNOSIS — I152 Hypertension secondary to endocrine disorders: Secondary | ICD-10-CM | POA: Diagnosis not present

## 2023-02-04 DIAGNOSIS — E1159 Type 2 diabetes mellitus with other circulatory complications: Secondary | ICD-10-CM

## 2023-02-04 DIAGNOSIS — Z794 Long term (current) use of insulin: Secondary | ICD-10-CM | POA: Diagnosis not present

## 2023-02-04 DIAGNOSIS — E1142 Type 2 diabetes mellitus with diabetic polyneuropathy: Secondary | ICD-10-CM

## 2023-02-04 DIAGNOSIS — Z7984 Long term (current) use of oral hypoglycemic drugs: Secondary | ICD-10-CM | POA: Diagnosis not present

## 2023-02-04 LAB — GLUCOSE, CAPILLARY: Glucose-Capillary: 189 mg/dL — ABNORMAL HIGH (ref 70–99)

## 2023-02-04 LAB — POCT GLYCOSYLATED HEMOGLOBIN (HGB A1C): Hemoglobin A1C: 7.2 % — AB (ref 4.0–5.6)

## 2023-02-04 NOTE — Patient Instructions (Signed)
Thank you, Ms.Myley L Seaberry for allowing [place] to provide your care today. Today we discussed your diabetes medications and blood pressure. Towards the end of the visit you mentioned some concern regarding swallowing, discussed staying hydrated. You declined a GI referral at this time but will consider for your follow up visit if symptoms worsen.   I have ordered the following labs for you:   Lab Orders         Glucose, capillary         POC Hbg A1C      Tests ordered today:  - None  Referrals ordered today:   Referral Orders  No referral(s) requested today     I have ordered the following medication/changed the following medications:   Stop the following medications: Medications Discontinued During This Encounter  Medication Reason   Continuous Blood Gluc Receiver (FREESTYLE LIBRE 2 READER SYSTM) DEVI Discontinued by provider   levonorgestrel (MIRENA, 52 MG,) 20 MCG/DAY IUD Discontinued by provider     Start the following medications: No orders of the defined types were placed in this encounter.    Follow up: 3 months   Remember:   - No medication changes today. Continue to take your diabetes and blood pressure medications as indicated. Please let us know if you are having more consistent symptoms of weakness or lightheadedness and we would be happy to revisit your blood pressure medications.   - You will keep a log of your swallowing symptoms in the event that hydrating more does not help and you feel like you are having more frequent symptoms. We would be happy to refer to a specialist at your next visit if it is still a concern.   Should you have any questions or concerns please call the internal medicine clinic at [phone].    Glenyce Randle Colbert Coyer, MD PGY-1 Internal Medicine Teaching Progam [facility]

## 2023-02-04 NOTE — Progress Notes (Signed)
Established Patient Office Visit  Subjective   Patient ID: Kelly Gallagher, female    DOB: 04/06/1970  Age: 52 y.o. MRN: [id number]  Chief Complaint  Patient presents with   Follow-up    2 months    Hypertension   Diabetes    Patient is a 52 yo with a past medical history stated below who presents today for follow-up for diabetes and blood pressure. Patient recently returned from a trip to [place], [place] last week for a wedding. Denies any concerns other than intermittent difficulty when she is swallowing a large pill. Patient is still eating and drinking fine, happens with some pills, not consistently. Discussed dry throat as a common symptom of perimenopause. Patient will increase hydration and provide an update at follow up visit in 2-3 months. Recommended return to clinic sooner if symptoms become more consistent or impact ability to eat and drink.   Please see problem based assessment and plan for additional details.     Past Medical History:  Diagnosis Date   Allergy 12/30/2020   seasonal   Anxiety 12/30/2020   Diabetes mellitus without complication (HCC)    GERD (gastroesophageal reflux disease)    Hypertension       Review of Systems  Constitutional:        Some fatigue  Cardiovascular:  Negative for chest pain, palpitations and leg swelling.  Genitourinary:  Negative for dysuria.  Neurological:  Negative for dizziness, weakness and headaches.     Objective:     BP 106/61 (BP Location: Left Arm, Patient Position: Sitting, Cuff Size: Large)   Pulse 64   Temp 98 F (36.7 C) (Oral)   Ht 5\' 5"  (1.651 m)   Wt 239 lb 4.8 oz (108.5 kg)   SpO2 100%   BMI 39.82 kg/m  BP Readings from Last 3 Encounters:  02/04/23 106/61  12/09/22 111/65  11/05/22 107/69   Wt Readings from Last 3 Encounters:  02/04/23 239 lb 4.8 oz (108.5 kg)  12/09/22 233 lb (105.7 kg)  11/05/22 236 lb 14.4 oz (107.5 kg)     Physical Exam HENT:     Head: Normocephalic and atraumatic.   Cardiovascular:     Rate and Rhythm: Normal rate and regular rhythm.     Pulses: Normal pulses.     Heart sounds: Normal heart sounds.  Pulmonary:     Effort: Pulmonary effort is normal.     Breath sounds: Normal breath sounds.  Abdominal:     General: Bowel sounds are normal.     Palpations: Abdomen is soft.  Skin:    General: Skin is warm and dry.  Neurological:     General: No focal deficit present.     Mental Status: She is alert.  Psychiatric:        Mood and Affect: Mood normal.        Behavior: Behavior normal.    Results for orders placed or performed in visit on 02/04/23  Glucose, capillary  Result Value Ref Range   Glucose-Capillary 189 (H) 70 - 99 mg/dL  POC Hbg G4W  Result Value Ref Range   Hemoglobin A1C 7.2 (A) 4.0 - 5.6 %   HbA1c POC (<> result, manual entry)     HbA1c, POC (prediabetic range)     HbA1c, POC (controlled diabetic range)     Last hemoglobin A1c Lab Results  Component Value Date   HGBA1C 7.2 (A) 02/04/2023     The ASCVD Risk score (Arnett DK, et  al., 2019) failed to calculate for the following reasons:   The valid total cholesterol range is 130 to 320 mg/dL    Assessment & Plan:   Problem List Items Addressed This Visit     Type 2 diabetes mellitus, with long-term current use of insulin (HCC) - Primary (Chronic)    Patient with average blood glucose levels of 162 for last 90 days, now lows. States she has been having less spikes in the morning, highest glucose levels have been 230s. No issues with Freestyle Libre CGM, some missing data while patient was on trip last week. Has been taking Tresiba 80 units BID, Metformin 1000 mg BID, and Mounjaro 15 mg weekly, tolerating well. Also takes atorvastatin 20 mg daily. Hgb A1c today 7.2% from 8.8% in July 2024. Congratulated patient on A1c. No medication changes today.  - Continue Tresiba 80 units BID, Metformin 1000 mg BID, and Mounjaro 15 mg weekly - Continue atorvastatin 20 mg daily       Relevant Orders   POC Hbg A1C (Completed)   Hypertension associated with diabetes (HCC) (Chronic)    Patient's BP 106/61, HR 64. Denies CP, lightheadedness, or syncope. Does endorse some fatigue but states it's likely related to coming back from recent trip. Taking metoprolol 100 mg daily and olmesartan-hydrochlorothiazide 20-12.5 mg daily, tolerating well. BMP in July with serum Cr WNL, electrolytes WNL, stable anion gap. Discussed possibly reducing medication dose in the future if patient becomes symptomatic from lower BP. Patient acknowledged understanding. No medication changes today.  - Continue metoprolol 100 mg daily and olmesartan-hydrochlorothiazide 20-12.5 mg daily      Return 2-3 months, for diabetes, blood pressure management and swallowing .  Patient seen with Dr. Criselda Peaches.   Tyleigh Mahn Colbert Coyer, MD

## 2023-02-06 NOTE — Assessment & Plan Note (Signed)
Patient with average blood glucose levels of 162 for last 90 days, now lows. States she has been having less spikes in the morning, highest glucose levels have been 230s. No issues with Freestyle Libre CGM, some missing data while patient was on trip last week. Has been taking Tresiba 80 units BID, Metformin 1000 mg BID, and Mounjaro 15 mg weekly, tolerating well. Also takes atorvastatin 20 mg daily. Hgb A1c today 7.2% from 8.8% in July 2024. Congratulated patient on A1c. No medication changes today.  - Continue Tresiba 80 units BID, Metformin 1000 mg BID, and Mounjaro 15 mg weekly - Continue atorvastatin 20 mg daily

## 2023-02-06 NOTE — Assessment & Plan Note (Signed)
Patient's BP 106/61, HR 64. Denies CP, lightheadedness, or syncope. Does endorse some fatigue but states it's likely related to coming back from recent trip. Taking metoprolol 100 mg daily and olmesartan-hydrochlorothiazide 20-12.5 mg daily, tolerating well. BMP in July with serum Cr WNL, electrolytes WNL, stable anion gap. Discussed possibly reducing medication dose in the future if patient becomes symptomatic from lower BP. Patient acknowledged understanding. No medication changes today.  - Continue metoprolol 100 mg daily and olmesartan-hydrochlorothiazide 20-12.5 mg daily

## 2023-02-10 ENCOUNTER — Other Ambulatory Visit: Payer: Self-pay

## 2023-02-12 NOTE — Progress Notes (Signed)
 Internal Medicine Clinic Attending  I was physically present during the key portions of the resident provided service and participated in the medical decision making of patient's management care. I reviewed pertinent patient test results.  The assessment, diagnosis, and plan were formulated together and I agree with the documentation in the resident's note.  Inez Catalina, MD

## 2023-02-15 ENCOUNTER — Encounter: Payer: Self-pay | Admitting: Student

## 2023-02-15 ENCOUNTER — Other Ambulatory Visit: Payer: Self-pay

## 2023-02-15 DIAGNOSIS — F411 Generalized anxiety disorder: Secondary | ICD-10-CM

## 2023-02-16 ENCOUNTER — Other Ambulatory Visit (HOSPITAL_COMMUNITY): Payer: Self-pay

## 2023-02-16 ENCOUNTER — Other Ambulatory Visit: Payer: Self-pay

## 2023-02-16 MED ORDER — DULOXETINE HCL 60 MG PO CPEP
60.0000 mg | ORAL_CAPSULE | Freq: Every day | ORAL | 5 refills | Status: DC
  Filled 2023-02-16: qty 30, 30d supply, fill #0
  Filled 2023-03-18: qty 30, 30d supply, fill #1
  Filled 2023-04-15: qty 30, 30d supply, fill #2
  Filled 2023-05-12: qty 30, 30d supply, fill #3
  Filled 2023-06-11: qty 30, 30d supply, fill #4
  Filled 2023-07-09: qty 30, 30d supply, fill #5

## 2023-02-17 ENCOUNTER — Other Ambulatory Visit: Payer: Self-pay

## 2023-02-17 ENCOUNTER — Other Ambulatory Visit (HOSPITAL_COMMUNITY): Payer: Self-pay

## 2023-02-22 ENCOUNTER — Telehealth: Payer: 59 | Admitting: Physician Assistant

## 2023-02-22 DIAGNOSIS — A084 Viral intestinal infection, unspecified: Secondary | ICD-10-CM | POA: Diagnosis not present

## 2023-02-22 MED ORDER — ONDANSETRON 4 MG PO TBDP: 4.0000 mg | ORAL_TABLET | Freq: Three times a day (TID) | ORAL | 0 refills | Status: DC | PRN

## 2023-02-22 NOTE — Progress Notes (Signed)
E-Visit for Nausea and Vomiting   We are sorry that you are not feeling well. Here is how we plan to help!  Based on what you have shared with me it looks like you have a Virus that is irritating your GI tract.  Vomiting is the forceful emptying of a portion of the stomach's content through the mouth.  Although nausea and vomiting can make you feel miserable, it's important to remember that these are not diseases, but rather symptoms of an underlying illness.  When we treat short term symptoms, we always caution that any symptoms that persist should be fully evaluated in a medical office.  I have prescribed a medication that will help alleviate your symptoms and allow you to stay hydrated:  Zofran 4-8 mg 1 tablet every 8 hours as needed for nausea and vomiting  Imodium 2 mg tablets that are over the counter at your local pharmacy. Take two tablet now and then one after each loose stool up to 6 a day.    HOME CARE: Drink clear liquids.  This is very important! Dehydration (the lack of fluid) can lead to a serious complication.  Start off with 1 tablespoon every 5 minutes for 8 hours. You may begin eating bland foods after 8 hours without vomiting.  Start with saltine crackers, white bread, rice, mashed potatoes, applesauce. After 48 hours on a bland diet, you may resume a normal diet. Try to go to sleep.  Sleep often empties the stomach and relieves the need to vomit.  GET HELP RIGHT AWAY IF:  Your symptoms do not improve or worsen within 2 days after treatment. You have a fever for over 3 days. You cannot keep down fluids after trying the medication.  MAKE SURE YOU:  Understand these instructions. Will watch your condition. Will get help right away if you are not doing well or get worse.    Thank you for choosing an e-visit.  Your e-visit answers were reviewed by a board certified advanced clinical practitioner to complete your personal care plan. Depending upon the condition, your  plan could have included both over the counter or prescription medications.  Please review your pharmacy choice. Make sure the pharmacy is open so you can pick up prescription now. If there is a problem, you may contact your provider through [employer] and have the prescription routed to another pharmacy.  Your safety is important to [place]. If you have drug allergies check your prescription carefully.   For the next 24 hours you can use MyChart to ask questions about today's visit, request a non-urgent call back, or ask for a work or school excuse. You will get an email in the next two days asking about your experience. I hope that your e-visit has been valuable and will speed your recovery.   I have spent 5 minutes in review of e-visit questionnaire, review and updating patient chart, medical decision making and response to patient.   Margaretann Loveless, PA-C

## 2023-03-03 ENCOUNTER — Other Ambulatory Visit (HOSPITAL_BASED_OUTPATIENT_CLINIC_OR_DEPARTMENT_OTHER): Payer: Self-pay

## 2023-03-05 ENCOUNTER — Encounter: Payer: Self-pay | Admitting: Student

## 2023-03-06 ENCOUNTER — Other Ambulatory Visit: Payer: Self-pay

## 2023-03-08 ENCOUNTER — Encounter (HOSPITAL_COMMUNITY): Payer: Self-pay

## 2023-03-09 ENCOUNTER — Other Ambulatory Visit (HOSPITAL_COMMUNITY): Payer: Self-pay

## 2023-03-09 ENCOUNTER — Other Ambulatory Visit: Payer: Self-pay | Admitting: *Deleted

## 2023-03-09 DIAGNOSIS — I10 Essential (primary) hypertension: Secondary | ICD-10-CM

## 2023-03-10 ENCOUNTER — Other Ambulatory Visit (HOSPITAL_COMMUNITY): Payer: Self-pay

## 2023-03-10 ENCOUNTER — Other Ambulatory Visit: Payer: Self-pay

## 2023-03-10 ENCOUNTER — Other Ambulatory Visit (HOSPITAL_BASED_OUTPATIENT_CLINIC_OR_DEPARTMENT_OTHER): Payer: Self-pay

## 2023-03-10 MED ORDER — OLMESARTAN MEDOXOMIL-HCTZ 20-12.5 MG PO TABS
1.0000 | ORAL_TABLET | Freq: Every day | ORAL | 3 refills | Status: DC
  Filled 2023-03-10: qty 90, 90d supply, fill #0
  Filled 2023-05-30: qty 90, 90d supply, fill #1
  Filled 2023-09-04: qty 90, 90d supply, fill #2
  Filled 2023-12-01 – 2023-12-02 (×2): qty 90, 90d supply, fill #3

## 2023-03-11 ENCOUNTER — Other Ambulatory Visit: Payer: Self-pay | Admitting: Student

## 2023-03-18 ENCOUNTER — Other Ambulatory Visit: Payer: Self-pay

## 2023-03-30 ENCOUNTER — Other Ambulatory Visit (HOSPITAL_COMMUNITY): Payer: Self-pay

## 2023-04-05 ENCOUNTER — Encounter: Payer: Self-pay | Admitting: Student

## 2023-04-05 ENCOUNTER — Other Ambulatory Visit (HOSPITAL_BASED_OUTPATIENT_CLINIC_OR_DEPARTMENT_OTHER): Payer: Self-pay

## 2023-04-06 ENCOUNTER — Other Ambulatory Visit: Payer: Self-pay

## 2023-04-06 MED ORDER — INSULIN PEN NEEDLE 32G X 4 MM MISC
3 refills | Status: DC
  Filled 2023-04-06: qty 200, 90d supply, fill #0
  Filled 2023-07-09: qty 200, 90d supply, fill #1
  Filled 2023-10-06 – 2023-10-07 (×2): qty 200, 90d supply, fill #2
  Filled 2023-11-17: qty 200, 90d supply, fill #3

## 2023-04-07 ENCOUNTER — Other Ambulatory Visit: Payer: Self-pay

## 2023-04-11 ENCOUNTER — Telehealth: Payer: Commercial Managed Care - PPO

## 2023-04-11 ENCOUNTER — Encounter: Payer: Self-pay | Admitting: Physician Assistant

## 2023-04-11 DIAGNOSIS — R6889 Other general symptoms and signs: Secondary | ICD-10-CM | POA: Diagnosis not present

## 2023-04-11 MED ORDER — OSELTAMIVIR PHOSPHATE 75 MG PO CAPS: 75.0000 mg | ORAL_CAPSULE | Freq: Two times a day (BID) | ORAL | 0 refills | Status: AC

## 2023-04-11 MED ORDER — BENZONATATE 100 MG PO CAPS: ORAL_CAPSULE | ORAL | 0 refills | Status: DC

## 2023-04-11 NOTE — Patient Instructions (Signed)
Kelly Gallagher, thank you for joining Roney Jaffe, PA-C for today's virtual visit.  While this provider is not your primary care provider (PCP), if your PCP is located in our provider database this encounter information will be shared with them immediately following your visit.   A [facility] MyChart account gives you access to today's visit and all your visits, tests, and labs performed at [facility] " click here if you don't have a [place] MyChart account or go to [email or web address]  Consent: (Patient) Kelly Gallagher provided verbal consent for this virtual visit at the beginning of the encounter.  Current Medications:  Current Outpatient Medications:    benzonatate (TESSALON) 100 MG capsule, Take 1-2 caps PO TID PRN, Disp: 20 capsule, Rfl: 0   oseltamivir (TAMIFLU) 75 MG capsule, Take 1 capsule (75 mg total) by mouth 2 (two) times daily for 5 days., Disp: 10 capsule, Rfl: 0   atorvastatin (LIPITOR) 20 MG tablet, Take 1 tablet (20 mg total) by mouth daily., Disp: 90 tablet, Rfl: 3   Cholecalciferol (VITAMIN D3) 10 MCG (400 UNIT) tablet, daily., Disp: , Rfl:    Continuous Glucose Sensor (FREESTYLE LIBRE 3 SENSOR) MISC, Place 1 sensor on the skin every 14 days. Use to check glucose continuously, Disp: 2 each, Rfl: 11   DULoxetine (CYMBALTA) 60 MG capsule, Take 1 capsule (60 mg total) by mouth daily., Disp: 30 capsule, Rfl: 5   insulin degludec (TRESIBA FLEXTOUCH) 200 UNIT/ML FlexTouch Pen, Inject 80 Units into the skin in the morning and at bedtime., Disp: 15 mL, Rfl: 5   Insulin Pen Needle 32G X 4 MM MISC, Used to inject insulin twice dailys, Disp: 200 each, Rfl: 3   Insulin Syringe-Needle U-100 31G X 15/64" 1 ML MISC, Use to inject insulin twice daily, Disp: 200 each, Rfl: 3   loratadine (CLARITIN) 10 MG tablet, Take 10 mg by mouth daily., Disp: , Rfl:    metFORMIN (GLUCOPHAGE) 1000 MG tablet, Take 1 tablet (1,000 mg total) by mouth 2 (two) times daily with a meal.,  Disp: 60 tablet, Rfl: 3   metoprolol succinate (TOPROL-XL) 100 MG 24 hr tablet, Take 1 tablet (100 mg total) by mouth daily. WITH OR IMMEDIATELY FOLLOWING A MEAL, Disp: 90 tablet, Rfl: 3   Multiple Vitamin (MULTIVITAMIN) tablet, Take 1 tablet by mouth daily., Disp: , Rfl:    olmesartan-hydrochlorothiazide (BENICAR HCT) 20-12.5 MG tablet, Take 1 tablet by mouth daily., Disp: 90 tablet, Rfl: 3   Omega-3 Fatty Acids (FISH OIL) 1000 MG CAPS, Take 1 capsule (1,000 mg total) by mouth daily., Disp: 90 capsule, Rfl: 0   omeprazole (PRILOSEC) 20 MG capsule, Take 1 capsule (20 mg total) by mouth daily., Disp: 90 capsule, Rfl: 3   ondansetron (ZOFRAN-ODT) 4 MG disintegrating tablet, Take 1-2 tablets (4-8 mg total) by mouth every 8 (eight) hours as needed., Disp: 20 tablet, Rfl: 0   Probiotic Product (PROBIOTIC-10 PO), Take by mouth., Disp: , Rfl:    tirzepatide (MOUNJARO) 15 MG/0.5ML Pen, Inject 15 mg into the skin once a week., Disp: 6 mL, Rfl: 3   vitamin B-12 (CYANOCOBALAMIN) 1000 MCG tablet, Take by mouth., Disp: , Rfl:    Medications ordered in this encounter:  Meds ordered this encounter  Medications   oseltamivir (TAMIFLU) 75 MG capsule    Sig: Take 1 capsule (75 mg total) by mouth 2 (two) times daily for 5 days.    Dispense:  10 capsule    Refill:  0  Supervising Provider:   Merrilee Jansky [[id number]]   benzonatate (TESSALON) 100 MG capsule    Sig: Take 1-2 caps PO TID PRN    Dispense:  20 capsule    Refill:  0    Supervising Provider:   Merrilee Jansky [phone]     *If you need refills on other medications prior to your next appointment, please contact your pharmacy*  Follow-Up: Call back or seek an in-person evaluation if the symptoms worsen or if the condition fails to improve as anticipated.  [facility] [phone]  Other Instructions Influenza, Adult Influenza is also called the flu. It's an infection that affects your respiratory tract. This includes your  nose, throat, windpipe, and lungs. The flu is contagious. This means it spreads easily from person to person. It causes symptoms that are like a cold. It can also cause a high fever and body aches. What are the causes? The flu is caused by the influenza virus. You can get it by: Breathing in droplets that are in the air after an infected person coughs or sneezes. Touching something that has the virus on it and then touching your mouth, nose, or eyes. What increases the risk? You may be more likely to get the flu if: You don't wash your hands often. You're near a lot of people during cold and flu season. You touch your mouth, eyes, or nose without washing your hands first. You don't get a flu shot each year. You may also be more at risk for the flu and serious problems, such as a lung infection called pneumonia, if: You're older than 65. You're pregnant. Your immune system is weak. Your immune system is your body's defense system. You have a long-term, or chronic, condition, such as: Heart, kidney, or lung disease. Diabetes. A liver disorder. Asthma. You're very overweight. You have anemia. This is when you don't have enough red blood cells in your body. What are the signs or symptoms? Flu symptoms often start all of a sudden. They may last 4-14 days and include: Fever and chills. Headaches, body aches, or muscle aches. Sore throat. Cough. Runny or stuffy nose. Discomfort in your chest. Not wanting to eat as much as normal. Feeling weak or tired. Feeling dizzy. Nausea or vomiting. How is this diagnosed? The flu may be diagnosed based on your symptoms and medical history. You may also have a physical exam. A swab may be taken from your nose or throat and tested for the virus. How is this treated? If the flu is found early, you can be treated with antiviral medicine. This may be given to you by mouth or through an IV. It can help you feel less sick and get better faster. Taking  care of yourself at home can also help your symptoms get better. Your health care provider may tell you to: Take over-the-counter medicines. Drink lots of fluids. The flu often goes away on its own. If you have very bad symptoms or problems caused by the flu, you may need to be treated in a hospital. Follow these instructions at home: Activity Rest as needed. Get lots of sleep. Stay home from work or school as told by your provider. Leave home only to go see your provider. Do not leave home for other reasons until you don't have a fever for 24 hours without taking medicine. Eating and drinking Take an oral rehydration solution (ORS). This is a drink that is sold at pharmacies and stores. Drink  enough fluid to keep your pee pale yellow. Try to drink small amounts of clear fluids. These include water, ice chips, fruit juice mixed with water, and low-calorie sports drinks. Try to eat bland foods that are easy to digest. These include bananas, applesauce, rice, lean meats, toast, and crackers. Avoid drinks that have a lot of sugar or caffeine in them. These include energy drinks, regular sports drinks, and soda. Do not drink alcohol. Do not eat spicy or fatty foods. General instructions     Take your medicines only as told by your provider. Use a cool mist humidifier to add moisture to the air in your home. This can make it easier for you to breathe. You should also clean the humidifier every day. To do so: Empty the water. Pour clean water in. Cover your mouth and nose when you cough or sneeze. Wash your hands with soap and water often and for at least 20 seconds. It's extra important to do so after you cough or sneeze. If you can't use soap and water, use hand sanitizer. How is this prevented?  Get a flu shot every year. Ask your provider when you should get your flu shot. Stay away from people who are sick during fall and winter. Fall and winter are cold and flu season. Contact a  health care provider if: You get new symptoms. You have chest pain. You have watery poop, also called diarrhea. You have a fever. Your cough gets worse. You start to have more mucus. You feel like you may vomit, or you vomit. Get help right away if: You become short of breath or have trouble breathing. Your skin or nails turn blue. You have very bad pain or stiffness in your neck. You get a sudden headache or pain in your face or ear. You vomit each time you eat or drink. These symptoms may be an emergency. Call 911 right away. Do not wait to see if the symptoms will go away. Do not drive yourself to the hospital. This information is not intended to replace advice given to you by your health care provider. Make sure you discuss any questions you have with your health care provider. Document Revised: 12/10/2022 Document Reviewed: 04/16/2022 Elsevier Patient Education  2024 [employer]vier [employer]   If you have been instructed to have an in-person evaluation today at a local Urgent Care facility, please use the link below. It will take you to a list of all of our available [facility], including address, phone number and hours of operation. Please do not delay care.  [facility]  If you or a family member do not have a primary care provider, use the link below to schedule a visit and establish care. When you choose a [facility] primary care physician or advanced practice provider, you gain a long-term partner in health. Find a Primary Care Provider  Learn more about [facility]'s in-office and virtual care options:  - Get Care Now

## 2023-04-11 NOTE — Progress Notes (Signed)
Virtual Visit Consent   Kelly Gallagher, you are scheduled for a virtual visit with a [facility] provider today. Just as with appointments in the office, your consent must be obtained to participate. Your consent will be active for this visit and any virtual visit you may have with one of our providers in the next 365 days. If you have a MyChart account, a copy of this consent can be sent to you electronically.  As this is a virtual visit, video technology does not allow for your provider to perform a traditional examination. This may limit your provider's ability to fully assess your condition. If your provider identifies any concerns that need to be evaluated in person or the need to arrange testing (such as labs, EKG, etc.), we will make arrangements to do so. Although advances in technology are sophisticated, we cannot ensure that it will always work on either your end or our end. If the connection with a video visit is poor, the visit may have to be switched to a telephone visit. With either a video or telephone visit, we are not always able to ensure that we have a secure connection.  By engaging in this virtual visit, you consent to the provision of healthcare and authorize for your insurance to be billed (if applicable) for the services provided during this visit. Depending on your insurance coverage, you may receive a charge related to this service.  I need to obtain your verbal consent now. Are you willing to proceed with your visit today? Kelly Gallagher has provided verbal consent on 04/11/2023 for a virtual visit (video or telephone). Roney Jaffe, PA-C  Date: 04/11/2023 11:39 AM  Virtual Visit via Video Note   I, Kelly Gallagher, connected with  Kelly Gallagher  ([id number], [birth year]) on 04/11/23 at 11:00 AM EST by a video-enabled telemedicine application and verified that I am speaking with the correct person using two identifiers.  Location: Patient: Virtual Visit Location Patient:  Home Provider: Virtual Visit Location Provider: Home Office   I discussed the limitations of evaluation and management by telemedicine and the availability of in person appointments. The patient expressed understanding and agreed to proceed.    History of Present Illness: Kelly Gallagher is a 53 y.o. who identifies as a female who was assigned female at birth,  presented with symptoms suggestive of influenza. The onset of symptoms was sudden, beginning on the evening of Friday, with headache and congestion. By Saturday morning, the patient reported feeling extremely fatigued, alternating between feeling hot and cold, and sounding congested. The patient also reported a cough and sore throat, but denied expectorating any sputum. However, they noted green nasal discharge.  The patient had been in close contact with their boss, who had been diagnosed with influenza the previous week.   Home Covid test negative.  Problems:  Patient Active Problem List   Diagnosis Date Noted   Wrist pain, acute, right 11/11/2021   Intertrigo 11/11/2021   Upper respiratory infection, viral 11/11/2021   Neutrophilia 08/14/2021   Hypotension 08/14/2021   Hypokalemia 08/14/2021   Hypercalcemia 08/14/2021   GI symptoms 04/15/2021   Weight gain 07/25/2020   Vaginal candidiasis 04/25/2019   Healthcare maintenance 04/29/2018   Type 2 diabetes mellitus, with long-term current use of insulin (HCC) 04/28/2018   Hypertension associated with diabetes (HCC) 04/28/2018   Generalized anxiety disorder 04/28/2018   Hyperlipidemia associated with type 2 diabetes mellitus (HCC) 04/28/2018   Morbid obesity (HCC) 04/28/2018  IUD (intrauterine device) in place 04/28/2018   GERD (gastroesophageal reflux disease) 04/28/2018   Reactive airway disease 04/28/2018   Vitamin B12 deficiency 04/28/2018    Allergies:  Allergies  Allergen Reactions   Ace Inhibitors Anaphylaxis   Effexor Xr [Venlafaxine Hcl]     Patient states caused  symptomatic elevated serotonin levels   Medications:  Current Outpatient Medications:    benzonatate (TESSALON) 100 MG capsule, Take 1-2 caps PO TID PRN, Disp: 20 capsule, Rfl: 0   oseltamivir (TAMIFLU) 75 MG capsule, Take 1 capsule (75 mg total) by mouth 2 (two) times daily for 5 days., Disp: 10 capsule, Rfl: 0   atorvastatin (LIPITOR) 20 MG tablet, Take 1 tablet (20 mg total) by mouth daily., Disp: 90 tablet, Rfl: 3   Cholecalciferol (VITAMIN D3) 10 MCG (400 UNIT) tablet, daily., Disp: , Rfl:    Continuous Glucose Sensor (FREESTYLE LIBRE 3 SENSOR) MISC, Place 1 sensor on the skin every 14 days. Use to check glucose continuously, Disp: 2 each, Rfl: 11   DULoxetine (CYMBALTA) 60 MG capsule, Take 1 capsule (60 mg total) by mouth daily., Disp: 30 capsule, Rfl: 5   insulin degludec (TRESIBA FLEXTOUCH) 200 UNIT/ML FlexTouch Pen, Inject 80 Units into the skin in the morning and at bedtime., Disp: 15 mL, Rfl: 5   Insulin Pen Needle 32G X 4 MM MISC, Used to inject insulin twice dailys, Disp: 200 each, Rfl: 3   Insulin Syringe-Needle U-100 31G X 15/64" 1 ML MISC, Use to inject insulin twice daily, Disp: 200 each, Rfl: 3   loratadine (CLARITIN) 10 MG tablet, Take 10 mg by mouth daily., Disp: , Rfl:    metFORMIN (GLUCOPHAGE) 1000 MG tablet, Take 1 tablet (1,000 mg total) by mouth 2 (two) times daily with a meal., Disp: 60 tablet, Rfl: 3   metoprolol succinate (TOPROL-XL) 100 MG 24 hr tablet, Take 1 tablet (100 mg total) by mouth daily. WITH OR IMMEDIATELY FOLLOWING A MEAL, Disp: 90 tablet, Rfl: 3   Multiple Vitamin (MULTIVITAMIN) tablet, Take 1 tablet by mouth daily., Disp: , Rfl:    olmesartan-hydrochlorothiazide (BENICAR HCT) 20-12.5 MG tablet, Take 1 tablet by mouth daily., Disp: 90 tablet, Rfl: 3   Omega-3 Fatty Acids (FISH OIL) 1000 MG CAPS, Take 1 capsule (1,000 mg total) by mouth daily., Disp: 90 capsule, Rfl: 0   omeprazole (PRILOSEC) 20 MG capsule, Take 1 capsule (20 mg total) by mouth daily.,  Disp: 90 capsule, Rfl: 3   ondansetron (ZOFRAN-ODT) 4 MG disintegrating tablet, Take 1-2 tablets (4-8 mg total) by mouth every 8 (eight) hours as needed., Disp: 20 tablet, Rfl: 0   Probiotic Product (PROBIOTIC-10 PO), Take by mouth., Disp: , Rfl:    tirzepatide (MOUNJARO) 15 MG/0.5ML Pen, Inject 15 mg into the skin once a week., Disp: 6 mL, Rfl: 3   vitamin B-12 (CYANOCOBALAMIN) 1000 MCG tablet, Take by mouth., Disp: , Rfl:   Observations/Objective: Patient is well-developed, well-nourished in no acute distress.  Resting comfortably  at home.  Head is normocephalic, atraumatic.  No labored breathing.  Speech is clear and coherent with logical content.  Patient is alert and oriented at baseline.    Assessment and Plan: 1. Flu-like symptoms (Primary) - oseltamivir (TAMIFLU) 75 MG capsule; Take 1 capsule (75 mg total) by mouth 2 (two) times daily for 5 days.  Dispense: 10 capsule; Refill: 0 - benzonatate (TESSALON) 100 MG capsule; Take 1-2 caps PO TID PRN  Dispense: 20 capsule; Refill: 0  Trial Tamiflu, Tessalon Perles.  Patient education given on supportive care.  Follow Up Instructions: I discussed the assessment and treatment plan with the patient. The patient was provided an opportunity to ask questions and all were answered. The patient agreed with the plan and demonstrated an understanding of the instructions.  A copy of instructions were sent to the patient via MyChart unless otherwise noted below.    The patient was advised to call back or seek an in-person evaluation if the symptoms worsen or if the condition fails to improve as anticipated.    Kasandra Knudsen Mayers, PA-C

## 2023-04-15 ENCOUNTER — Other Ambulatory Visit: Payer: Self-pay

## 2023-04-20 ENCOUNTER — Other Ambulatory Visit (HOSPITAL_COMMUNITY): Payer: Self-pay

## 2023-04-20 ENCOUNTER — Other Ambulatory Visit (HOSPITAL_BASED_OUTPATIENT_CLINIC_OR_DEPARTMENT_OTHER): Payer: Self-pay

## 2023-04-21 ENCOUNTER — Other Ambulatory Visit: Payer: Self-pay

## 2023-04-23 ENCOUNTER — Other Ambulatory Visit (HOSPITAL_COMMUNITY): Payer: Self-pay

## 2023-04-28 ENCOUNTER — Other Ambulatory Visit: Payer: Self-pay

## 2023-04-28 ENCOUNTER — Other Ambulatory Visit (HOSPITAL_BASED_OUTPATIENT_CLINIC_OR_DEPARTMENT_OTHER): Payer: Self-pay

## 2023-05-02 ENCOUNTER — Other Ambulatory Visit: Payer: Self-pay

## 2023-05-03 ENCOUNTER — Other Ambulatory Visit (HOSPITAL_COMMUNITY): Payer: Self-pay

## 2023-05-03 ENCOUNTER — Encounter: Payer: Self-pay | Admitting: Student

## 2023-05-03 MED ORDER — ATORVASTATIN CALCIUM 20 MG PO TABS
20.0000 mg | ORAL_TABLET | Freq: Every day | ORAL | 0 refills | Status: DC
  Filled 2023-05-03: qty 90, 90d supply, fill #0

## 2023-05-03 MED ORDER — OMEPRAZOLE 20 MG PO CPDR
20.0000 mg | DELAYED_RELEASE_CAPSULE | Freq: Every day | ORAL | 0 refills | Status: DC
  Filled 2023-05-03: qty 90, 90d supply, fill #0

## 2023-05-04 ENCOUNTER — Other Ambulatory Visit: Payer: Self-pay

## 2023-05-04 ENCOUNTER — Other Ambulatory Visit (HOSPITAL_COMMUNITY): Payer: Self-pay

## 2023-05-06 ENCOUNTER — Encounter: Payer: 59 | Admitting: Student

## 2023-05-12 ENCOUNTER — Other Ambulatory Visit: Payer: Self-pay | Admitting: Student

## 2023-05-12 ENCOUNTER — Other Ambulatory Visit: Payer: Self-pay

## 2023-05-12 ENCOUNTER — Other Ambulatory Visit (HOSPITAL_COMMUNITY): Payer: Self-pay

## 2023-05-12 DIAGNOSIS — E1142 Type 2 diabetes mellitus with diabetic polyneuropathy: Secondary | ICD-10-CM

## 2023-05-12 NOTE — Telephone Encounter (Signed)
 NO LONGER [facility] PATIENT

## 2023-05-14 ENCOUNTER — Ambulatory Visit: Payer: Commercial Managed Care - PPO | Admitting: Family Medicine

## 2023-05-14 ENCOUNTER — Other Ambulatory Visit (HOSPITAL_BASED_OUTPATIENT_CLINIC_OR_DEPARTMENT_OTHER): Payer: Self-pay

## 2023-05-14 ENCOUNTER — Encounter: Payer: Self-pay | Admitting: Family Medicine

## 2023-05-14 VITALS — BP 108/63 | HR 67 | Ht 65.0 in | Wt 235.0 lb

## 2023-05-14 DIAGNOSIS — Z114 Encounter for screening for human immunodeficiency virus [HIV]: Secondary | ICD-10-CM | POA: Diagnosis not present

## 2023-05-14 DIAGNOSIS — K219 Gastro-esophageal reflux disease without esophagitis: Secondary | ICD-10-CM

## 2023-05-14 DIAGNOSIS — F411 Generalized anxiety disorder: Secondary | ICD-10-CM | POA: Diagnosis not present

## 2023-05-14 DIAGNOSIS — E1159 Type 2 diabetes mellitus with other circulatory complications: Secondary | ICD-10-CM | POA: Diagnosis not present

## 2023-05-14 DIAGNOSIS — E538 Deficiency of other specified B group vitamins: Secondary | ICD-10-CM

## 2023-05-14 DIAGNOSIS — Z794 Long term (current) use of insulin: Secondary | ICD-10-CM

## 2023-05-14 DIAGNOSIS — E785 Hyperlipidemia, unspecified: Secondary | ICD-10-CM | POA: Diagnosis not present

## 2023-05-14 DIAGNOSIS — E1169 Type 2 diabetes mellitus with other specified complication: Secondary | ICD-10-CM | POA: Diagnosis not present

## 2023-05-14 DIAGNOSIS — E1142 Type 2 diabetes mellitus with diabetic polyneuropathy: Secondary | ICD-10-CM

## 2023-05-14 DIAGNOSIS — I152 Hypertension secondary to endocrine disorders: Secondary | ICD-10-CM | POA: Diagnosis not present

## 2023-05-14 DIAGNOSIS — Z0000 Encounter for general adult medical examination without abnormal findings: Secondary | ICD-10-CM

## 2023-05-14 DIAGNOSIS — Z1159 Encounter for screening for other viral diseases: Secondary | ICD-10-CM

## 2023-05-14 LAB — CBC WITH DIFFERENTIAL/PLATELET
Basophils Absolute: 0.1 10*3/uL (ref 0.0–0.1)
Basophils Relative: 0.8 % (ref 0.0–3.0)
Eosinophils Absolute: 0.6 10*3/uL (ref 0.0–0.7)
Eosinophils Relative: 6.4 % — ABNORMAL HIGH (ref 0.0–5.0)
HCT: 39.1 % (ref 36.0–46.0)
Hemoglobin: 12.7 g/dL (ref 12.0–15.0)
Lymphocytes Relative: 26.3 % (ref 12.0–46.0)
Lymphs Abs: 2.3 10*3/uL (ref 0.7–4.0)
MCHC: 32.5 g/dL (ref 30.0–36.0)
MCV: 86.4 fL (ref 78.0–100.0)
Monocytes Absolute: 0.5 10*3/uL (ref 0.1–1.0)
Monocytes Relative: 5.7 % (ref 3.0–12.0)
Neutro Abs: 5.4 10*3/uL (ref 1.4–7.7)
Neutrophils Relative %: 60.8 % (ref 43.0–77.0)
Platelets: 298 10*3/uL (ref 150.0–400.0)
RBC: 4.53 Mil/uL (ref 3.87–5.11)
RDW: 13.8 % (ref 11.5–15.5)
WBC: 8.8 10*3/uL (ref 4.0–10.5)

## 2023-05-14 LAB — COMPREHENSIVE METABOLIC PANEL
ALT: 26 U/L (ref 0–35)
AST: 19 U/L (ref 0–37)
Albumin: 4.2 g/dL (ref 3.5–5.2)
Alkaline Phosphatase: 59 U/L (ref 39–117)
BUN: 8 mg/dL (ref 6–23)
CO2: 31 meq/L (ref 19–32)
Calcium: 9.4 mg/dL (ref 8.4–10.5)
Chloride: 99 meq/L (ref 96–112)
Creatinine, Ser: 0.69 mg/dL (ref 0.40–1.20)
GFR: 100.8 mL/min (ref >60.00)
Glucose, Bld: 200 mg/dL — ABNORMAL HIGH (ref 70–99)
Potassium: 4.5 meq/L (ref 3.5–5.1)
Sodium: 140 meq/L (ref 135–145)
Total Bilirubin: 0.3 mg/dL (ref 0.2–1.2)
Total Protein: 6.5 g/dL (ref 6.0–8.3)

## 2023-05-14 LAB — LIPID PANEL
Cholesterol: 126 mg/dL (ref 0–200)
HDL: 51.9 mg/dL (ref >39.00)
LDL Cholesterol: 32 mg/dL (ref 0–99)
NonHDL: 74.18
Total CHOL/HDL Ratio: 2
Triglycerides: 213 mg/dL — ABNORMAL HIGH (ref 0.0–149.0)
VLDL: 42.6 mg/dL — ABNORMAL HIGH (ref 0.0–40.0)

## 2023-05-14 LAB — HEMOGLOBIN A1C: Hgb A1c MFr Bld: 8.1 % — ABNORMAL HIGH (ref 4.6–6.5)

## 2023-05-14 LAB — TSH: TSH: 2.05 u[IU]/mL (ref 0.35–5.50)

## 2023-05-14 LAB — B12 AND FOLATE PANEL
Folate: 22.8 ng/mL (ref >5.9)
Vitamin B-12: 161 pg/mL — ABNORMAL LOW (ref 211–911)

## 2023-05-14 MED ORDER — OMEPRAZOLE 20 MG PO CPDR
20.0000 mg | DELAYED_RELEASE_CAPSULE | Freq: Every day | ORAL | 0 refills | Status: DC
  Filled 2023-05-14 – 2023-07-26 (×2): qty 90, 90d supply, fill #0

## 2023-05-14 MED ORDER — HYDROXYZINE HCL 10 MG PO TABS
10.0000 mg | ORAL_TABLET | Freq: Three times a day (TID) | ORAL | 0 refills | Status: DC | PRN
  Filled 2023-05-14: qty 30, 10d supply, fill #0

## 2023-05-14 MED ORDER — TRESIBA FLEXTOUCH 200 UNIT/ML ~~LOC~~ SOPN
80.0000 [IU] | PEN_INJECTOR | Freq: Two times a day (BID) | SUBCUTANEOUS | 5 refills | Status: DC
  Filled 2023-05-14 – 2023-05-30 (×2): qty 15, 19d supply, fill #0

## 2023-05-14 MED ORDER — TIRZEPATIDE 15 MG/0.5ML ~~LOC~~ SOAJ
15.0000 mg | SUBCUTANEOUS | 3 refills | Status: DC
  Filled 2023-05-14 – 2023-05-28 (×3): qty 6, 84d supply, fill #0

## 2023-05-14 MED ORDER — METFORMIN HCL 1000 MG PO TABS
1000.0000 mg | ORAL_TABLET | Freq: Two times a day (BID) | ORAL | 3 refills | Status: DC
  Filled 2023-05-14: qty 60, 30d supply, fill #0
  Filled 2023-06-11: qty 60, 30d supply, fill #1
  Filled 2023-07-09: qty 60, 30d supply, fill #2
  Filled 2023-08-17: qty 60, 30d supply, fill #3

## 2023-05-14 NOTE — Assessment & Plan Note (Signed)
 On Metformin 1000mg  BID, Mounjaro 15 mg weekly, and Tresiba  80 units BID. Freestyle Libre in use. Recent estimated HbA1c of 7% per Josephine Igo. -Order HbA1c today.

## 2023-05-14 NOTE — Patient Instructions (Signed)
 Thank you for choosing [facility] at [facility] for your Primary Care needs. I am excited for the opportunity to partner with you to meet your health care goals. It was a pleasure meeting you today!  Information on diet, exercise, and health maintenance recommendations are listed below. This is information to help you be sure you are on track for optimal health and monitoring.   Please look over this and let us know if you have any questions or if you have completed any of the health maintenance outside of [facility] so that we can be sure your records are up to date.  ___________________________________________________________  MyChart:  For all urgent or time sensitive needs we ask that you please call the office to avoid delays. Our number is [phone]. MyChart is not constantly monitored and due to the large volume of messages a day, replies may take up to 72 business hours.  MyChart Policy: MyChart allows for you to see your visit notes, after visit summary, provider recommendations, lab and tests results, make an appointment, request refills, and contact your provider or the office for non-urgent questions or concerns. Providers are seeing patients during normal business hours and do not have built in time to review MyChart messages.  We ask that you allow a minimum of 3 business days for responses to [employer]. For this reason, please do not send urgent requests through MyChart. Please call the office at [phone]. New and ongoing conditions may require a visit. We have virtual and in-person visits available for your convenience.  Complex MyChart concerns may require a visit. Your provider may request you schedule a virtual or in-person visit to ensure we are providing the best care possible. MyChart messages sent after 11:00 AM on Friday may not be received by the provider until Monday morning.    Lab and Test Results: You will receive your lab and test  results on MyChart as soon as they are completed and results have been sent by the lab or testing facility. Due to this service, you will receive your results BEFORE your provider.  I review lab and test results each morning prior to seeing patients. Some results require collaboration with other providers to ensure you are receiving the most appropriate care. For this reason, we ask that you please allow a minimum of 3-5 business days from the time that ALL results have been received for your provider to receive and review lab and test results and contact you about these.  Most lab and test result comments from the provider will be sent through MyChart. Your provider may recommend changes to the plan of care, follow-up visits, repeat testing, ask questions, or request an office visit to discuss these results. You may reply directly to this message or call the office to provide information for the provider or set up an appointment. In some instances, you will be called with test results and recommendations. Please let us know if this is preferred and we will make note of this in your chart to provide this for you.    If you have not heard a response to your lab or test results in 5 business days from all results returning to MyChart, please call the office to let us know. We ask that you please avoid calling prior to this time unless there is an emergent concern. Due to high call volumes, this can delay the resulting process.  After Hours: For all non-emergency after hours needs, please  call the office at [phone] and select the option to reach the on-call  service. On-call services are shared between multiple [facility] offices and therefore it will not be possible to speak directly with your provider. On-call providers may provide medical advice and recommendations, but are unable to provide refills for maintenance medications.  For all emergency or urgent medical needs after normal business hours, we  recommend that you seek care at the closest Urgent Care or Emergency Department to ensure appropriate treatment in a timely manner.  [facility] has a 24 hour emergency room located on the ground floor for your convenience.   Urgent Concerns During the Business Day Providers are seeing patients from 8AM to 5PM with a busy schedule and are most often not able to respond to non-urgent calls until the end of the day or the next business day. If you should have URGENT concerns during the day, please call and speak to the nurse or schedule a same day appointment so that we can address your concern without delay.   Thank you, again, for choosing me as your health care partner. I appreciate your trust and look forward to learning more about you!   Kelly Marrow Reola Calkins, DNP, FNP-C  ___________________________________________________________  Health Maintenance Recommendations Screening Testing Mammogram Every 1-2 years based on history and risk factors Starting at age 60 Pap Smear Ages 21-39 every 3 years Ages 22-65 every 5 years with HPV testing More frequent testing may be required based on results and history Colon Cancer Screening Every 1-10 years based on test performed, risk factors, and history Starting at age 29 Bone Density Screening Every 2-10 years based on history Starting at age 79 for women Recommendations for men differ based on medication usage, history, and risk factors AAA Screening One time ultrasound Men 59-28 years old who have ever smoked Lung Cancer Screening Low Dose Lung CT every 12 months Age 51-80 years with a 20 pack-year smoking history who still smoke or who have quit within the last 15 years  Screening Labs Routine  Labs: Complete Blood Count (CBC), Complete Metabolic Panel (CMP), Cholesterol (Lipid Panel) Every 6-12 months based on history and medications May be recommended more frequently based on current conditions or previous results Hemoglobin  A1c Lab Every 3-12 months based on history and previous results Starting at age 79 or earlier with diagnosis of diabetes, high cholesterol, BMI >26, and/or risk factors Frequent monitoring for patients with diabetes to ensure blood sugar control Thyroid Panel  Every 6 months based on history, symptoms, and risk factors May be repeated more often if on medication HIV One time testing for all patients 66 and older May be repeated more frequently for patients with increased risk factors or exposure Hepatitis C One time testing for all patients 59 and older May be repeated more frequently for patients with increased risk factors or exposure Gonorrhea, Chlamydia Every 12 months for all sexually active persons 13-24 years Additional monitoring may be recommended for those who are considered high risk or who have symptoms PSA Men 69-17 years old with risk factors Additional screening may be recommended from age 43-69 based on risk factors, symptoms, and history  Vaccine Recommendations Tetanus Booster All adults every 10 years Flu Vaccine All patients 6 months and older every year COVID Vaccine All patients 12 years and older Initial dosing with booster May recommend additional booster based on age and health history HPV Vaccine 2 doses all patients age 71-26 Dosing may be considered  for patients over 26 Shingles Vaccine (Shingrix) 2 doses all adults 50 years and older Pneumonia (Pneumovax 23) All adults 65 years and older May recommend earlier dosing based on health history Pneumonia (Prevnar 53) All adults 65 years and older Dosed 1 year after Pneumovax 23 Pneumonia (Prevnar 20) All adults 65 years and older (adults 19-64 with certain conditions or risk factors) 1 dose  For those who have not received Prevnar 13 vaccine previously   Additional Screening, Testing, and Vaccinations may be recommended on an individualized basis based on family history, health history, risk  factors, and/or exposure.  __________________________________________________________  Diet Recommendations for All Patients  I recommend that all patients maintain a diet low in saturated fats, carbohydrates, and cholesterol. While this can be challenging at first, it is not impossible and small changes can make big differences.  Things to try: Decreasing the amount of soda, sweet tea, and/or juice to one or less per day and replace with water While water is always the first choice, if you do not like water you may consider adding a water additive without sugar to improve the taste other sugar free drinks Replace potatoes with a brightly colored vegetable  Use healthy oils, such as canola oil or olive oil, instead of butter or hard margarine Limit your bread intake to two pieces or less a day Replace regular pasta with low carb pasta options Bake, broil, or grill foods instead of frying Monitor portion sizes  Eat smaller, more frequent meals throughout the day instead of large meals  An important thing to remember is, if you love foods that are not great for your health, you don't have to give them up completely. Instead, allow these foods to be a reward when you have done well. Allowing yourself to still have special treats every once in a while is a nice way to tell yourself thank you for working hard to keep yourself healthy.   Also remember that every day is a new day. If you have a bad day and "fall off the wagon", you can still climb right back up and keep moving along on your journey!  We have resources available to help you!  Some websites that may be helpful include: www.http://www.wall-moore.info/  Www.VeryWellFit.com _____________________________________________________________  Activity Recommendations for All Patients  I recommend that all adults get at least 30 minutes of moderate physical activity that elevates your heart rate at least 5 days out of the week.  Some examples  include: Walking or jogging at a pace that allows you to carry on a conversation Cycling (stationary bike or outdoors) Water aerobics Yoga Weight lifting Dancing If physical limitations prevent you from putting stress on your joints, exercise in a pool or seated in a chair are excellent options.  Do determine your MAXIMUM heart rate for activity: 220 - YOUR AGE = MAX Heart Rate   Remember! Do not push yourself too hard.  Start slowly and build up your pace, speed, weight, time in exercise, etc.  Allow your body to rest between exercise and get good sleep. You will need more water than normal when you are exerting yourself. Do not wait until you are thirsty to drink. Drink with a purpose of getting in at least 8, 8 ounce glasses of water a day plus more depending on how much you exercise and sweat.    If you begin to develop dizziness, chest pain, abdominal pain, jaw pain, shortness of breath, headache, vision changes, lightheadedness, or other concerning symptoms,  stop the activity and allow your body to rest. If your symptoms are severe, seek emergency evaluation immediately. If your symptoms are concerning, but not severe, please let us know so that we can recommend further evaluation.

## 2023-05-14 NOTE — Assessment & Plan Note (Signed)
 Medication management:  Lifestyle factors for lowering cholesterol include: Lipitor 20 mg daily, Omega-3 Diet therapy - heart-healthy diet rich in fruits, veggies, fiber-rich whole grains, lean meats, chicken, fish (at least twice a week), fat-free or 1% dairy products; foods low in saturated/trans fats, cholesterol, sodium, and sugar. Mediterranean diet has shown to be very heart healthy. Regular exercise - recommend at least 30 minutes a day, 5 times per week Weight management  Repeat CMP and lipid panel today

## 2023-05-14 NOTE — Progress Notes (Signed)
 New Patient Office Visit  Subjective    Patient ID: Kelly Gallagher, female    DOB: September 10, 1970  Age: 53 y.o. MRN: [id number]  CC:  Chief Complaint  Patient presents with   Establish Care    HPI Kelly Gallagher presents to establish care. She lives with her husband. 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    Discussed the use of AI scribe software for clinical note transcription with the patient, who gave verbal consent to proceed.  History of Present Illness   Kelly Gallagher is a 53 year old female who presents for a routine follow-up.  She is managing her type 2 diabetes mellitus with a Freestyle Libre, Tresiba 80 units twice a day, metformin 1000 mg twice a day, and Mounjaro 15 mg weekly. Her recent blood sugar readings have been stable, though she notes higher readings in the past two weeks due to stress. 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  For hypertension, she takes metoprolol and hydrochlorothiazide. She monitors her blood pressure at home, noting it can be slightly low in the morning. Her menstrual cycles have been regular since the removal of her IUD in September, although she skipped a cycle in November. Her periods were previously heavy due to PCOS but are now manageable, with a seven-day period occurring two weeks ago.  She experiences difficulty sleeping, often staying awake until 2-3 AM, ongoing for about six months. She takes melatonin but finds it insufficient. She sometimes feels anxious at night, especially when working late on school projects.  She is allergic to ACE inhibitors and Effexor, with the latter causing her to feel like she was 'coming out of my skin'. She takes Cymbalta 60 mg daily for mood, B12 1000 mcg daily, vitamin D 400 IU, Lipitor for cholesterol, omega-3 supplements, and  omeprazole 20 mg at night for reflux caused by her medications.         Hypertension: - Medications: metoprolol succinate 100 mg daily, olmesartan-hctz 20-12.5 mg daily - Compliance: good - Checking BP at home:  - Denies any SOB, recurrent headaches, CP, vision changes, LE edema, dizziness, palpitations, or medication side effects.   Diabetes: - Checking glucose at home: yes, Libre, well controlled overall - Medications: Tresiba 80 units BID, metformin 1,000 mg BID, Mounjaro 15 mg weekly - Compliance: good - Diet: low carb - Exercise: minimal - Eye exam: UTD - Foot exam: today - Microalbumin: - - Denies symptoms of hypoglycemia, polyuria, polydipsia, numbness extremities, foot ulcers/trauma, wounds that are not healing, medication side effects    Hyperlipidemia: - medications: Lipitor 20 mg daily, Omega-3 - compliance: good - medication SEs: none The ASCVD Risk score (Arnett DK, et al., 2019) failed to calculate for the following reasons:   The valid total cholesterol range is 130 to 320 mg/dL    Mood follow-up: - Diagnosis:anxiety - Treatment: Cymbalta 60 mg daily - Medication side effects: none - SI/HI: none - Update: Stable overall. Recently under a lot of stress the past few weeks, trouble sleeping at times.    GERD: - managing with omeprazole 20 mg daily    Vitamin B12 deficiency: - taking 1,000 mcg daily oral replacement     .     Outpatient Encounter Medications as of 05/14/2023  Medication Sig   atorvastatin (LIPITOR) 20 MG tablet Take  1 tablet (20 mg total) by mouth daily.   benzonatate (TESSALON) 100 MG capsule Take 1-2 caps PO TID PRN   Cholecalciferol (VITAMIN D3) 10 MCG (400 UNIT) tablet daily.   Continuous Glucose Sensor (FREESTYLE LIBRE 3 SENSOR) MISC Place 1 sensor on the skin every 14 days. Use to check glucose continuously   DULoxetine (CYMBALTA) 60 MG capsule Take 1 capsule (60 mg total) by mouth daily.   hydrOXYzine (ATARAX) 10 MG  tablet Take 1 tablet (10 mg total) by mouth 3 (three) times daily as needed.   Insulin Pen Needle 32G X 4 MM MISC Used to inject insulin twice dailys   Insulin Syringe-Needle U-100 31G X 15/64" 1 ML MISC Use to inject insulin twice daily   loratadine (CLARITIN) 10 MG tablet Take 10 mg by mouth daily.   metoprolol succinate (TOPROL-XL) 100 MG 24 hr tablet Take 1 tablet (100 mg total) by mouth daily. WITH OR IMMEDIATELY FOLLOWING A MEAL   Multiple Vitamin (MULTIVITAMIN) tablet Take 1 tablet by mouth daily.   olmesartan-hydrochlorothiazide (BENICAR HCT) 20-12.5 MG tablet Take 1 tablet by mouth daily.   Omega-3 Fatty Acids (FISH OIL) 1000 MG CAPS Take 1 capsule (1,000 mg total) by mouth daily.   ondansetron (ZOFRAN-ODT) 4 MG disintegrating tablet Take 1-2 tablets (4-8 mg total) by mouth every 8 (eight) hours as needed.   Probiotic Product (PROBIOTIC-10 PO) Take by mouth.   vitamin B-12 (CYANOCOBALAMIN) 1000 MCG tablet Take by mouth.   [DISCONTINUED] insulin degludec (TRESIBA FLEXTOUCH) 200 UNIT/ML FlexTouch Pen Inject 80 Units into the skin in the morning and at bedtime.   [DISCONTINUED] metFORMIN (GLUCOPHAGE) 1000 MG tablet Take 1 tablet (1,000 mg total) by mouth 2 (two) times daily with a meal.   [DISCONTINUED] omeprazole (PRILOSEC) 20 MG capsule Take 1 capsule (20 mg total) by mouth daily.   [DISCONTINUED] tirzepatide (MOUNJARO) 15 MG/0.5ML Pen Inject 15 mg into the skin once a week.   insulin degludec (TRESIBA FLEXTOUCH) 200 UNIT/ML FlexTouch Pen Inject 80 Units into the skin in the morning and at bedtime.   metFORMIN (GLUCOPHAGE) 1000 MG tablet Take 1 tablet (1,000 mg total) by mouth 2 (two) times daily with a meal.   omeprazole (PRILOSEC) 20 MG capsule Take 1 capsule (20 mg total) by mouth daily.   tirzepatide (MOUNJARO) 15 MG/0.5ML Pen Inject 15 mg into the skin once a week.   No facility-administered encounter medications on file as of 05/14/2023.    Past Medical History:  Diagnosis Date    Allergy 12/30/2020   seasonal   Anxiety 12/30/2020   Depression    Diabetes mellitus without complication (HCC)    GERD (gastroesophageal reflux disease)    Hypertension     Past Surgical History:  Procedure Laterality Date   CHOLECYSTECTOMY     COLONOSCOPY  01/13/2021   JMP   DILATION AND CURETTAGE OF UTERUS     KNEE ARTHROSCOPY W/ MENISCAL REPAIR Right 05/2010    Family History  Problem Relation Age of Onset   Colon polyps Father    Cancer Father        Head and neck cancer. Has PEG   Hearing loss Father    Lupus Sister    Breast cancer Other    Breast cancer Other    Anxiety disorder Mother    Depression Mother    Diabetes Mother    Early death Mother    Hypertension Mother    Miscarriages / [place] Mother    Anxiety disorder Sister  Diabetes Sister    Hypertension Sister    Arthritis Brother    Cancer Paternal Uncle    Colon cancer Neg Hx    Esophageal cancer Neg Hx    Rectal cancer Neg Hx    Stomach cancer Neg Hx     Social History   Socioeconomic History   Marital status: Married    Spouse name: Not on file   Number of children: Not on file   Years of education: Not on file   Highest education level: Bachelor's degree (e.g., BA, AB, BS)  Occupational History   Not on file  Tobacco Use   Smoking status: Never   Smokeless tobacco: Never  Vaping Use   Vaping status: Never Used  Substance and Sexual Activity   Alcohol use: Yes    Alcohol/week: 1.0 standard drink of alcohol    Types: 1 Glasses of wine per week   Drug use: Never   Sexual activity: Yes    Partners: Male    Birth control/protection: None  Other Topics Concern   Not on file  Social History Narrative   Not on file   Social Drivers of Health   Financial Resource Strain: Low Risk  (05/11/2023)   Overall Financial Resource Strain (CARDIA)    Difficulty of Paying Living Expenses: Not very hard  Food Insecurity: No Food Insecurity (05/11/2023)   Hunger Vital Sign    Worried  About Running Out of Food in the Last Year: Never true    Ran Out of Food in the Last Year: Never true  Transportation Needs: No Transportation Needs (05/11/2023)   PRAPARE - Administrator, Civil Service (Medical): No    Lack of Transportation (Non-Medical): No  Physical Activity: Unknown (05/11/2023)   Exercise Vital Sign    Days of Exercise per Week: 0 days    Minutes of Exercise per Session: Not on file  Stress: Stress Concern Present (05/11/2023)   [employer] of Occupational Health - Occupational Stress Questionnaire    Feeling of Stress : To some extent  Social Connections: Socially Integrated (05/11/2023)   Social Connection and Isolation Panel [NHANES]    Frequency of Communication with Friends and Family: More than three times a week    Frequency of Social Gatherings with Friends and Family: Once a week    Attends Religious Services: 1 to 4 times per year    Active Member of [employer] or Organizations: No    Attends Engineer, structural: More than 4 times per year    Marital Status: Married  Catering manager Violence: Not At Risk (06/04/2022)   Humiliation, Afraid, Rape, and Kick questionnaire    Fear of Current or Ex-Partner: No    Emotionally Abused: No    Physically Abused: No    Sexually Abused: No    ROS All review of systems negative except what is listed in the HPI      Objective    BP 108/63   Pulse 67   Ht 5\' 5"  (1.651 m)   Wt 235 lb (106.6 kg)   SpO2 96%   BMI 39.11 kg/m   Physical Exam Vitals reviewed.  Constitutional:      Appearance: Normal appearance. She is obese.  Cardiovascular:     Rate and Rhythm: Normal rate and regular rhythm.     Heart sounds: Normal heart sounds.  Pulmonary:     Effort: Pulmonary effort is normal.     Breath sounds: Normal breath sounds.  Feet:     Comments: Diabetic foot exam was performed.  No deformities or other abnormal visual findings.  Posterior tibialis and dorsalis pulse intact  bilaterally.  Intact to touch and monofilament testing bilaterally.   Skin:    General: Skin is warm and dry.  Neurological:     Mental Status: She is alert and oriented to person, place, and time.  Psychiatric:        Mood and Affect: Mood normal.        Behavior: Behavior normal.        Thought Content: Thought content normal.        Judgment: Judgment normal.         Assessment & Plan:   Problem List Items Addressed This Visit       Active Problems   Type 2 diabetes mellitus, with long-term current use of insulin (HCC) - Primary (Chronic)   On Metformin 1000mg  BID, Mounjaro 15 mg weekly, and Tresiba  80 units BID. Freestyle Libre in use. Recent estimated HbA1c of 7% per Josephine Igo. -Order HbA1c today.      Relevant Medications   insulin degludec (TRESIBA FLEXTOUCH) 200 UNIT/ML FlexTouch Pen   metFORMIN (GLUCOPHAGE) 1000 MG tablet   tirzepatide (MOUNJARO) 15 MG/0.5ML Pen   Other Relevant Orders   CBC with Differential/Platelet   Comprehensive metabolic panel   Hemoglobin A1c   Lipid panel   Hypertension associated with diabetes (HCC) (Chronic)   Blood pressure is at goal for age and co-morbidities.   Recommendations: continue metoprolol succinate 100 mg daily, olmesartan-hctz 20-12.5 mg daily - BP goal <130/80 - monitor and log blood pressures at home - check around the same time each day in a relaxed setting - Limit salt to <2000 mg/day - Follow DASH eating plan (heart healthy diet) - limit alcohol to 2 standard drinks per day for men and 1 per day for women - avoid tobacco products - get at least 2 hours of regular aerobic exercise weekly Patient aware of signs/symptoms requiring further/urgent evaluation. Labs updated today.       Relevant Medications   insulin degludec (TRESIBA FLEXTOUCH) 200 UNIT/ML FlexTouch Pen   metFORMIN (GLUCOPHAGE) 1000 MG tablet   tirzepatide (MOUNJARO) 15 MG/0.5ML Pen   Other Relevant Orders   CBC with Differential/Platelet    Comprehensive metabolic panel   TSH   Lipid panel   Generalized anxiety disorder (Chronic)   On Duloxetine 60mg  daily. Reports periods of increased stress and anxiety. -Continue current regimen. Consider use of PRN Hydroxyzine as needed for anxiety/insomnia      Relevant Medications   hydrOXYzine (ATARAX) 10 MG tablet   Hyperlipidemia associated with type 2 diabetes mellitus (HCC) (Chronic)   Medication management:  Lifestyle factors for lowering cholesterol include: Lipitor 20 mg daily, Omega-3 Diet therapy - heart-healthy diet rich in fruits, veggies, fiber-rich whole grains, lean meats, chicken, fish (at least twice a week), fat-free or 1% dairy products; foods low in saturated/trans fats, cholesterol, sodium, and sugar. Mediterranean diet has shown to be very heart healthy. Regular exercise - recommend at least 30 minutes a day, 5 times per week Weight management  Repeat CMP and lipid panel today       Relevant Medications   insulin degludec (TRESIBA FLEXTOUCH) 200 UNIT/ML FlexTouch Pen   metFORMIN (GLUCOPHAGE) 1000 MG tablet   tirzepatide (MOUNJARO) 15 MG/0.5ML Pen   Other Relevant Orders   Comprehensive metabolic panel   Lipid panel   Morbid obesity (HCC) (Chronic)   Labs  today Encouraged healthy diet and regular exercise       Relevant Medications   insulin degludec (TRESIBA FLEXTOUCH) 200 UNIT/ML FlexTouch Pen   metFORMIN (GLUCOPHAGE) 1000 MG tablet   tirzepatide (MOUNJARO) 15 MG/0.5ML Pen   GERD (gastroesophageal reflux disease) (Chronic)   Well controlled with lifestyle measures and omeprazole.       Relevant Medications   omeprazole (PRILOSEC) 20 MG capsule   Vitamin B12 deficiency (Chronic)   Continue supplementation. Labs today       Relevant Orders   B12 and Folate Panel   Other Visit Diagnoses       Encounter for medical examination to establish care         Controlled type 2 diabetes mellitus with diabetic polyneuropathy, with long-term current use  of insulin (HCC)       Relevant Medications   insulin degludec (TRESIBA FLEXTOUCH) 200 UNIT/ML FlexTouch Pen   metFORMIN (GLUCOPHAGE) 1000 MG tablet   tirzepatide (MOUNJARO) 15 MG/0.5ML Pen   hydrOXYzine (ATARAX) 10 MG tablet     Encounter for screening for HIV       Relevant Orders   HIV Antibody (routine testing w rflx)     Encounter for hepatitis C screening test for low risk patient       Relevant Orders   Hepatitis C antibody       Return in about 3 months (around 08/11/2023) for routine follow-up or CPE if eligible.   Clayborne Dana, NP

## 2023-05-14 NOTE — Assessment & Plan Note (Signed)
 Blood pressure is at goal for age and co-morbidities.   Recommendations: continue metoprolol succinate 100 mg daily, olmesartan-hctz 20-12.5 mg daily - BP goal <130/80 - monitor and log blood pressures at home - check around the same time each day in a relaxed setting - Limit salt to <2000 mg/day - Follow DASH eating plan (heart healthy diet) - limit alcohol to 2 standard drinks per day for men and 1 per day for women - avoid tobacco products - get at least 2 hours of regular aerobic exercise weekly Patient aware of signs/symptoms requiring further/urgent evaluation. Labs updated today.

## 2023-05-14 NOTE — Assessment & Plan Note (Signed)
 On Duloxetine 60mg  daily. Reports periods of increased stress and anxiety. -Continue current regimen. Consider use of PRN Hydroxyzine as needed for anxiety/insomnia

## 2023-05-14 NOTE — Assessment & Plan Note (Signed)
 Labs today Encouraged healthy diet and regular exercise

## 2023-05-14 NOTE — Assessment & Plan Note (Signed)
 Well controlled with lifestyle measures and omeprazole.

## 2023-05-14 NOTE — Assessment & Plan Note (Signed)
Continue supplementation. Labs today  

## 2023-05-15 LAB — HIV ANTIBODY (ROUTINE TESTING W REFLEX): HIV 1&2 Ab, 4th Generation: NONREACTIVE

## 2023-05-15 LAB — HEPATITIS C ANTIBODY: Hepatitis C Ab: NONREACTIVE

## 2023-05-17 ENCOUNTER — Encounter: Payer: Self-pay | Admitting: Family Medicine

## 2023-05-17 DIAGNOSIS — Z794 Long term (current) use of insulin: Secondary | ICD-10-CM

## 2023-05-17 NOTE — Progress Notes (Signed)
 B12 is low. If you are already taking 1,000 mcg of B12 daily, increase to 2,000 mcg daily. We can recheck in about 3 months to see if it is working. A1c has gone up. Have you ever been on an SGLT2 like Farxiga, [place], etc? This might be worth trying. Triglycerides are a little high, will likely improve as we get your sugars under better control. Continue heart healthy, low-carb diet and regular exercise.

## 2023-05-26 ENCOUNTER — Other Ambulatory Visit (HOSPITAL_BASED_OUTPATIENT_CLINIC_OR_DEPARTMENT_OTHER): Payer: Self-pay

## 2023-05-26 ENCOUNTER — Other Ambulatory Visit: Payer: Self-pay

## 2023-05-26 ENCOUNTER — Other Ambulatory Visit (HOSPITAL_COMMUNITY): Payer: Self-pay

## 2023-05-26 MED ORDER — METFORMIN HCL 500 MG PO TABS
500.0000 mg | ORAL_TABLET | Freq: Every day | ORAL | 3 refills | Status: DC
  Filled 2023-05-26: qty 90, 90d supply, fill #0

## 2023-05-27 ENCOUNTER — Other Ambulatory Visit: Payer: Self-pay

## 2023-05-28 ENCOUNTER — Other Ambulatory Visit (HOSPITAL_BASED_OUTPATIENT_CLINIC_OR_DEPARTMENT_OTHER): Payer: Self-pay

## 2023-05-31 ENCOUNTER — Other Ambulatory Visit: Payer: Self-pay

## 2023-05-31 ENCOUNTER — Other Ambulatory Visit (HOSPITAL_BASED_OUTPATIENT_CLINIC_OR_DEPARTMENT_OTHER): Payer: Self-pay

## 2023-05-31 ENCOUNTER — Other Ambulatory Visit (HOSPITAL_COMMUNITY): Payer: Self-pay

## 2023-06-01 ENCOUNTER — Other Ambulatory Visit (HOSPITAL_BASED_OUTPATIENT_CLINIC_OR_DEPARTMENT_OTHER): Payer: Self-pay

## 2023-06-01 ENCOUNTER — Other Ambulatory Visit: Payer: Self-pay | Admitting: Family Medicine

## 2023-06-01 DIAGNOSIS — Z794 Long term (current) use of insulin: Secondary | ICD-10-CM

## 2023-06-01 DIAGNOSIS — E1142 Type 2 diabetes mellitus with diabetic polyneuropathy: Secondary | ICD-10-CM

## 2023-06-01 MED ORDER — TRESIBA FLEXTOUCH 200 UNIT/ML ~~LOC~~ SOPN
80.0000 [IU] | PEN_INJECTOR | Freq: Two times a day (BID) | SUBCUTANEOUS | 1 refills | Status: DC
  Filled 2023-06-01: qty 72, 90d supply, fill #0
  Filled 2023-09-12: qty 24, 30d supply, fill #1

## 2023-06-02 ENCOUNTER — Other Ambulatory Visit (HOSPITAL_BASED_OUTPATIENT_CLINIC_OR_DEPARTMENT_OTHER): Payer: Self-pay

## 2023-06-11 ENCOUNTER — Other Ambulatory Visit (HOSPITAL_COMMUNITY): Payer: Self-pay

## 2023-06-25 ENCOUNTER — Other Ambulatory Visit (HOSPITAL_COMMUNITY): Payer: Self-pay

## 2023-07-09 ENCOUNTER — Other Ambulatory Visit: Payer: Self-pay

## 2023-07-12 ENCOUNTER — Other Ambulatory Visit: Payer: Self-pay

## 2023-07-12 ENCOUNTER — Other Ambulatory Visit (HOSPITAL_BASED_OUTPATIENT_CLINIC_OR_DEPARTMENT_OTHER): Payer: Self-pay

## 2023-07-12 ENCOUNTER — Encounter (HOSPITAL_BASED_OUTPATIENT_CLINIC_OR_DEPARTMENT_OTHER): Payer: Self-pay

## 2023-07-12 ENCOUNTER — Emergency Department (HOSPITAL_BASED_OUTPATIENT_CLINIC_OR_DEPARTMENT_OTHER)
Admission: EM | Admit: 2023-07-12 | Discharge: 2023-07-12 | Disposition: A | Discharge status: Home / Self Care | Attending: Emergency Medicine | Admitting: Emergency Medicine

## 2023-07-12 DIAGNOSIS — R112 Nausea with vomiting, unspecified: Secondary | ICD-10-CM | POA: Diagnosis not present

## 2023-07-12 DIAGNOSIS — Z794 Long term (current) use of insulin: Secondary | ICD-10-CM | POA: Insufficient documentation

## 2023-07-12 DIAGNOSIS — R197 Diarrhea, unspecified: Secondary | ICD-10-CM | POA: Insufficient documentation

## 2023-07-12 DIAGNOSIS — D72829 Elevated white blood cell count, unspecified: Secondary | ICD-10-CM | POA: Diagnosis not present

## 2023-07-12 DIAGNOSIS — A084 Viral intestinal infection, unspecified: Secondary | ICD-10-CM

## 2023-07-12 DIAGNOSIS — E860 Dehydration: Secondary | ICD-10-CM

## 2023-07-12 LAB — I-STAT VENOUS BLOOD GAS, ED
Acid-base deficit: 1 mmol/L (ref 0.0–2.0)
Bicarbonate: 24.8 mmol/L (ref 20.0–28.0)
Calcium, Ion: 1.11 mmol/L — ABNORMAL LOW (ref 1.15–1.40)
HCT: 41 % (ref 36.0–46.0)
Hemoglobin: 13.9 g/dL (ref 12.0–15.0)
O2 Saturation: 59 %
Patient temperature: 98.3
Potassium: 3.8 mmol/L (ref 3.5–5.1)
Sodium: 136 mmol/L (ref 135–145)
TCO2: 26 mmol/L (ref 22–32)
pCO2, Ven: 42.6 mmHg — ABNORMAL LOW (ref 44–60)
pH, Ven: 7.373 (ref 7.25–7.43)
pO2, Ven: 31 mmHg — CL (ref 32–45)

## 2023-07-12 LAB — URINALYSIS, ROUTINE W REFLEX MICROSCOPIC
Bilirubin Urine: NEGATIVE
Glucose, UA: >=500 mg/dL — AB
Ketones, ur: 80 mg/dL — AB
Leukocytes,Ua: NEGATIVE
Nitrite: NEGATIVE
Protein, ur: 30 mg/dL — AB
Specific Gravity, Urine: >=1.03 (ref 1.005–1.030)
pH: 5.5 (ref 5.0–8.0)

## 2023-07-12 LAB — CBC
HCT: 46.7 % — ABNORMAL HIGH (ref 36.0–46.0)
Hemoglobin: 15.5 g/dL — ABNORMAL HIGH (ref 12.0–15.0)
MCH: 27.7 pg (ref 26.0–34.0)
MCHC: 33.2 g/dL (ref 30.0–36.0)
MCV: 83.4 fL (ref 80.0–100.0)
Platelets: 227 10*3/uL (ref 150–400)
RBC: 5.6 MIL/uL — ABNORMAL HIGH (ref 3.87–5.11)
RDW: 13 % (ref 11.5–15.5)
WBC: 29.7 10*3/uL — ABNORMAL HIGH (ref 4.0–10.5)
nRBC: 0 % (ref 0.0–0.2)

## 2023-07-12 LAB — URINALYSIS, MICROSCOPIC (REFLEX): RBC / HPF: NONE SEEN RBC/hpf (ref 0–5)

## 2023-07-12 LAB — COMPREHENSIVE METABOLIC PANEL WITH GFR
ALT: 30 U/L (ref 0–44)
AST: 32 U/L (ref 15–41)
Albumin: 4.1 g/dL (ref 3.5–5.0)
Alkaline Phosphatase: 56 U/L (ref 38–126)
Anion gap: 20 — ABNORMAL HIGH (ref 5–15)
BUN: 15 mg/dL (ref 6–20)
CO2: 20 mmol/L — ABNORMAL LOW (ref 22–32)
Calcium: 9.1 mg/dL (ref 8.9–10.3)
Chloride: 96 mmol/L — ABNORMAL LOW (ref 98–111)
Creatinine, Ser: 0.85 mg/dL (ref 0.44–1.00)
GFR, Estimated: >60 mL/min (ref >60)
Glucose, Bld: 284 mg/dL — ABNORMAL HIGH (ref 70–99)
Potassium: 3.4 mmol/L — ABNORMAL LOW (ref 3.5–5.1)
Sodium: 136 mmol/L (ref 135–145)
Total Bilirubin: 0.8 mg/dL (ref 0.0–1.2)
Total Protein: 7.6 g/dL (ref 6.5–8.1)

## 2023-07-12 LAB — LIPASE, BLOOD: Lipase: 23 U/L (ref 11–51)

## 2023-07-12 LAB — PREGNANCY, URINE: Preg Test, Ur: NEGATIVE

## 2023-07-12 MED ORDER — LACTATED RINGERS IV BOLUS
1000.0000 mL | Freq: Once | INTRAVENOUS | Status: AC
  Administered 2023-07-12: 1000 mL via INTRAVENOUS

## 2023-07-12 MED ORDER — ONDANSETRON 4 MG PO TBDP
4.0000 mg | ORAL_TABLET | Freq: Once | ORAL | Status: AC
  Administered 2023-07-12: 4 mg via ORAL
  Filled 2023-07-12: qty 1

## 2023-07-12 MED ORDER — ONDANSETRON 4 MG PO TBDP
4.0000 mg | ORAL_TABLET | Freq: Three times a day (TID) | ORAL | 0 refills | Status: AC | PRN
  Filled 2023-07-12 – 2023-07-14 (×2): qty 20, 7d supply, fill #0

## 2023-07-12 NOTE — ED Notes (Signed)
 PIV attempt RFA anterior and posterior. Unsuccessful.

## 2023-07-12 NOTE — ED Triage Notes (Signed)
 Pt to ED by POV from home with c/o NVD since 6pm last night. Pt is taking munjaro for her diabetes, her last shot was this past Saturday. Pt is unable to keep anything. Arrives A+O, HR is elevated, all other VSS.

## 2023-07-12 NOTE — ED Provider Notes (Signed)
 [facility]  Provider Note  CSN: [id number] Arrival date & time: 07/12/23 0131  History Chief Complaint  Patient presents with   Emesis   Diarrhea    Kelly Gallagher is a 53 y.o. female brought to ED by husband for evaluation of multiple episodes of N/V/D and chills since around 1800hrs. She has had similar symptoms but less severe frequently for the last couple of years attributed to GLP-1 (initially Ozempic , now on Mounjaro ). She denies abdominal pain. Last had Mounjaro  on 4/19.    Home Medications Prior to Admission medications   Medication Sig Start Date End Date Taking? Authorizing Provider  atorvastatin  (LIPITOR) 20 MG tablet Take 1 tablet (20 mg total) by mouth daily. 05/03/23   Ronni Colace, DO  benzonatate  (TESSALON ) 100 MG capsule Take 1-2 caps PO TID PRN 04/11/23   Mayers, Cari S, PA-C  Cholecalciferol (VITAMIN D3) 10 MCG (400 UNIT) tablet daily. 05/05/19   [provider]  Continuous Glucose Sensor (FREESTYLE LIBRE 3 SENSOR) MISC Place 1 sensor on the skin every 14 days. Use to check glucose continuously 10/26/22   Ronni Colace, DO  DULoxetine  (CYMBALTA ) 60 MG capsule Take 1 capsule (60 mg total) by mouth daily. 02/16/23   Ronni Colace, DO  hydrOXYzine  (ATARAX ) 10 MG tablet Take 1 tablet (10 mg total) by mouth 3 (three) times daily as needed. 05/14/23   Everlina Hock, NP  insulin  degludec (TRESIBA  FLEXTOUCH) 200 UNIT/ML FlexTouch Pen Inject 80 Units into the skin in the morning and at bedtime. 06/01/23   Everlina Hock, NP  Insulin  Pen Needle 32G X 4 MM MISC Used to inject insulin  twice dailys 04/06/23   Ronni Colace, DO  Insulin  Syringe-Needle U-100 31G X 15/64" 1 ML MISC Use to inject insulin  twice daily 09/28/22   Masters, Alston Jerry, DO  loratadine (CLARITIN) 10 MG tablet Take 10 mg by mouth daily.    [provider]  metFORMIN  (GLUCOPHAGE ) 1000 MG tablet Take 1 tablet (1,000 mg total) by mouth 2 (two) times daily with  a meal. 05/14/23   Everlina Hock, NP  metFORMIN  (GLUCOPHAGE ) 500 MG tablet Take 1 tablet (500 mg total) by mouth daily with lunch. 05/26/23   Everlina Hock, NP  metoprolol  succinate (TOPROL -XL) 100 MG 24 hr tablet Take 1 tablet (100 mg total) by mouth daily. WITH OR IMMEDIATELY FOLLOWING A MEAL 12/10/22 12/10/23  Ronni Colace, DO  Multiple Vitamin (MULTIVITAMIN) tablet Take 1 tablet by mouth daily.    [provider]  olmesartan -hydrochlorothiazide  (BENICAR  HCT) 20-12.5 MG tablet Take 1 tablet by mouth daily. 03/10/23 03/09/24  Ronni Colace, DO  Omega-3 Fatty Acids  (FISH OIL ) 1000 MG CAPS Take 1 capsule (1,000 mg total) by mouth daily. 04/23/21   Melodye Spurr, MD  omeprazole  (PRILOSEC) 20 MG capsule Take 1 capsule (20 mg total) by mouth daily. 05/14/23   Everlina Hock, NP  ondansetron  (ZOFRAN -ODT) 4 MG disintegrating tablet Take 1 tablet (4 mg total) by mouth every 8 (eight) hours as needed for vomiting or nausea. 07/12/23   Charmayne Cooper, MD  Probiotic Product (PROBIOTIC-10 PO) Take by mouth.    [provider]  tirzepatide  (MOUNJARO ) 15 MG/0.5ML Pen Inject 15 mg into the skin once a week. 05/14/23   Everlina Hock, NP  vitamin B-12 (CYANOCOBALAMIN ) 1000 MCG tablet Take by mouth.    [provider]     Allergies    Ace inhibitors and Effexor xr Marcianne Settler  hcl]   Review of Systems   Review of Systems Please see HPI for pertinent positives and negatives  Physical Exam BP 124/75   Pulse 98   Temp 98.3 F (36.8 C) (Oral)   Resp 18   SpO2 96%   Physical Exam Vitals and nursing note reviewed.  Constitutional:      Appearance: Normal appearance.  HENT:     Head: Normocephalic and atraumatic.     Nose: Nose normal.     Mouth/Throat:     Mouth: Mucous membranes are moist.  Eyes:     Extraocular Movements: Extraocular movements intact.     Conjunctiva/sclera: Conjunctivae normal.  Cardiovascular:     Rate and Rhythm: Tachycardia present.  Pulmonary:      Effort: Pulmonary effort is normal.     Breath sounds: Normal breath sounds.  Abdominal:     General: Bowel sounds are normal.     Palpations: Abdomen is soft.     Tenderness: There is no abdominal tenderness. There is no guarding.  Musculoskeletal:        General: No swelling. Normal range of motion.     Cervical back: Neck supple.  Skin:    General: Skin is warm and dry.  Neurological:     General: No focal deficit present.     Mental Status: She is alert.  Psychiatric:        Mood and Affect: Mood normal.     ED Results / Procedures / Treatments   EKG None  Procedures Procedures  Medications Ordered in the ED Medications  ondansetron  (ZOFRAN -ODT) disintegrating tablet 4 mg (4 mg Oral Given 07/12/23 0146)  lactated ringers  bolus 1,000 mL (0 mLs Intravenous Stopped 07/12/23 0428)    Initial Impression and Plan  Patient here with episodic N/V/D, previously attributed to her diabetes medication. More severe and longer lasting tonight. Husband reports she looked ashen at home. Color improved now. CBC shows leukocytosis, similar to previous episode in 2023 which had resolved at PCP visit a few days later. Will check other labs and give antiemetics and IVF for GI losses. HR improved.   ED Course   Clinical Course as of 07/12/23 0616  Mon Jul 12, 2023  0402 CMP with mild anion gap and elevated CBG. Will check VBG. Lipase is normal.  [CS]  0454 VBG without significant acidosis, doubt DKA.  [CS]  0601 HCG is neg  [CS]  0607 UA with glucose and ketones, but no infection. Patient reports her symptoms have resolved, she is feeling much better, able to ambulate to the restroom without difficulty and tolerating PO Fluids. I discussed her lab abnormalities which are consistent with previous presentations for the most part. She was offered additional IVF, admission, etc, but she would like to go home. She will follow up with PCP for recheck in a few days. Rx for zofran  if needed. RTED  for any concerns.  [CS]    Clinical Course User Index [CS] Charmayne Cooper, MD     MDM Rules/Calculators/A&P Medical Decision Making Problems Addressed: Dehydration: acute illness or injury Nausea vomiting and diarrhea: chronic illness or injury with exacerbation, progression, or side effects of treatment  Amount and/or Complexity of Data Reviewed Labs: ordered. Decision-making details documented in ED Course.  Risk Prescription drug management. Decision regarding hospitalization.     Final Clinical Impression(s) / ED Diagnoses Final diagnoses:  Nausea vomiting and diarrhea  Dehydration    Rx / DC Orders ED Discharge Orders  Ordered    ondansetron  (ZOFRAN -ODT) 4 MG disintegrating tablet  Every 8 hours PRN        07/12/23 0616             Charmayne Cooper, MD 07/12/23 [phone]

## 2023-07-13 ENCOUNTER — Other Ambulatory Visit: Payer: Self-pay | Admitting: Family Medicine

## 2023-07-13 ENCOUNTER — Encounter: Payer: Self-pay | Admitting: Family Medicine

## 2023-07-13 DIAGNOSIS — E1142 Type 2 diabetes mellitus with diabetic polyneuropathy: Secondary | ICD-10-CM

## 2023-07-14 ENCOUNTER — Other Ambulatory Visit (HOSPITAL_BASED_OUTPATIENT_CLINIC_OR_DEPARTMENT_OTHER): Payer: Self-pay

## 2023-07-14 ENCOUNTER — Other Ambulatory Visit (HOSPITAL_COMMUNITY): Payer: Self-pay

## 2023-07-14 MED ORDER — SITAGLIPTIN PHOSPHATE 100 MG PO TABS
100.0000 mg | ORAL_TABLET | Freq: Every day | ORAL | 3 refills | Status: DC
  Filled 2023-07-14: qty 90, 90d supply, fill #0
  Filled 2023-10-07: qty 90, 90d supply, fill #1

## 2023-07-15 ENCOUNTER — Other Ambulatory Visit (HOSPITAL_COMMUNITY): Payer: Self-pay

## 2023-07-21 ENCOUNTER — Other Ambulatory Visit (HOSPITAL_COMMUNITY): Payer: Self-pay

## 2023-07-26 ENCOUNTER — Other Ambulatory Visit: Payer: Self-pay

## 2023-07-26 ENCOUNTER — Other Ambulatory Visit (HOSPITAL_COMMUNITY): Payer: Self-pay

## 2023-07-26 ENCOUNTER — Other Ambulatory Visit: Payer: Self-pay | Admitting: Student

## 2023-07-26 NOTE — Telephone Encounter (Signed)
 NO LONGER [facility] PATIENT

## 2023-07-27 ENCOUNTER — Other Ambulatory Visit: Payer: Self-pay

## 2023-07-30 ENCOUNTER — Telehealth: Payer: Self-pay | Admitting: *Deleted

## 2023-07-30 NOTE — Telephone Encounter (Signed)
 Triage nurse called and stated she triage pt because sugars was running in the 300s.  Pt does not want to go and be seen urgently today and would like to just keep her appt on 08/02/23 with PCP.  Pt has been taking the new medication Januvia .

## 2023-07-30 NOTE — Telephone Encounter (Signed)
 Initial Comment Caller states she stopped taking the Monjaro and her blood sugars have been high the last few days. She is requesting something to help being her blood sugars down. They have been fluctuating between 190-300. Provider is Minna Amass. Translation No Nurse Assessment Nurse: Harlon Light, RN, Vera Date/Time (Eastern Time): 07/30/2023 1:52:09 PM Confirm and document reason for call. If symptomatic, describe symptoms. ---Caller states she stopped her Mounjaro  about 3 weeks ago. went to the ER for side effects of nausea and vomiting. Stayed overnight and had IV fluids. has appt on Monday. Blood sugars 190-325. no dizziness or blurry vision. She is taking Januvia  or Jardiance . Does the patient have any new or worsening symptoms? ---Yes Will a triage be completed? ---Yes Related visit to physician within the last 2 weeks? ---No Does the PT have any chronic conditions? (i.e. diabetes, asthma, this includes High risk factors for pregnancy, etc.) ---Yes List chronic conditions. ---diabetes; HTN Is the patient pregnant or possibly pregnant? (Ask all females between the ages of 64-55) ---No Is this a behavioral health or substance abuse call? ---No Guidelines Guideline Title Affirmed Question Affirmed Notes Nurse Date/Time (Eastern Time) Diabetes - High Blood Sugar [1] Blood glucose > 300 mg/dL (40.9 mmol/L) AND [8] two or more times in a row [place], RN, Vera 07/30/2023 1:55:48 PM PLEASE NOTE: All timestamps contained within this report are represented as [place] Standard Time. CONFIDENTIALTY NOTICE: This fax transmission is intended only for the addressee. It contains information that is legally privileged, confidential or otherwise protected from use or disclosure. If you are not the intended recipient, you are strictly prohibited from reviewing, disclosing, copying using or disseminating any of this information or taking any action in reliance on or regarding this  information. If you have received this fax in error, please notify us  immediately by telephone so that we can arrange for its return to us . Phone: [phone], Toll-Free: [phone], Fax: [phone] [email or web address] [birth year] Page: 2 of 2 CallId: [id number] Disp. Time [facility] Time) Disposition Final User 07/30/2023 2:03:19 PM Call PCP Now Yes Harlon Light, RN, Sonny Dust Final Disposition 07/30/2023 2:03:19 PM Call PCP Now Yes Harlon Light, RN, Edyth Grana Disagree/Comply Comply Caller Understands Yes PreDisposition Call Doctor Care Advice Given Per Guideline CALL PCP NOW: * You need to discuss this with your doctor (or NP/PA). * Vomiting occurs CALL BACK IF: * You become worse * Rapid breathing occurs CARE ADVICE given per Diabetes - High Blood Sugar (Adult) guideline. Comments User: Marilee Showers, RN Date/Time [facility] Time): 07/30/2023 2:02:45 PM Called back line and gave report that pt's blood sugars have been 190-325. has had 2 blood sugars over 300 in a row. Pt is on Januvia  or Jardiance . Triage outcome call PCP now but pt has appt on Monday and just wants to keep that as she is feeling fine Referrals REFERRED TO PCP OFFICE

## 2023-08-02 ENCOUNTER — Other Ambulatory Visit: Payer: Self-pay | Admitting: Student

## 2023-08-02 ENCOUNTER — Encounter (HOSPITAL_COMMUNITY): Payer: Self-pay

## 2023-08-02 ENCOUNTER — Encounter: Payer: Self-pay | Admitting: Family Medicine

## 2023-08-02 ENCOUNTER — Other Ambulatory Visit (HOSPITAL_COMMUNITY): Payer: Self-pay

## 2023-08-02 ENCOUNTER — Other Ambulatory Visit: Payer: Self-pay

## 2023-08-02 ENCOUNTER — Other Ambulatory Visit: Payer: Self-pay | Admitting: Family Medicine

## 2023-08-02 ENCOUNTER — Ambulatory Visit: Admitting: Family Medicine

## 2023-08-02 ENCOUNTER — Other Ambulatory Visit (HOSPITAL_BASED_OUTPATIENT_CLINIC_OR_DEPARTMENT_OTHER): Payer: Self-pay

## 2023-08-02 VITALS — BP 111/59 | HR 65 | Ht 65.0 in | Wt 241.0 lb

## 2023-08-02 DIAGNOSIS — F411 Generalized anxiety disorder: Secondary | ICD-10-CM

## 2023-08-02 DIAGNOSIS — I152 Hypertension secondary to endocrine disorders: Secondary | ICD-10-CM | POA: Diagnosis not present

## 2023-08-02 DIAGNOSIS — E559 Vitamin D deficiency, unspecified: Secondary | ICD-10-CM | POA: Diagnosis not present

## 2023-08-02 DIAGNOSIS — E1169 Type 2 diabetes mellitus with other specified complication: Secondary | ICD-10-CM

## 2023-08-02 DIAGNOSIS — Z794 Long term (current) use of insulin: Secondary | ICD-10-CM

## 2023-08-02 DIAGNOSIS — E785 Hyperlipidemia, unspecified: Secondary | ICD-10-CM

## 2023-08-02 DIAGNOSIS — E1142 Type 2 diabetes mellitus with diabetic polyneuropathy: Secondary | ICD-10-CM | POA: Diagnosis not present

## 2023-08-02 DIAGNOSIS — K219 Gastro-esophageal reflux disease without esophagitis: Secondary | ICD-10-CM | POA: Diagnosis not present

## 2023-08-02 DIAGNOSIS — E538 Deficiency of other specified B group vitamins: Secondary | ICD-10-CM

## 2023-08-02 DIAGNOSIS — E1159 Type 2 diabetes mellitus with other circulatory complications: Secondary | ICD-10-CM

## 2023-08-02 LAB — GLUCOSE, POCT (MANUAL RESULT ENTRY): POC Glucose: 236 mg/dL — AB (ref 70–99)

## 2023-08-02 MED ORDER — METFORMIN HCL ER 500 MG PO TB24
500.0000 mg | ORAL_TABLET | Freq: Every day | ORAL | 1 refills | Status: DC
  Filled 2023-08-02: qty 90, 90d supply, fill #0
  Filled 2023-10-25 – 2023-10-26 (×2): qty 90, 90d supply, fill #1

## 2023-08-02 MED ORDER — ATORVASTATIN CALCIUM 20 MG PO TABS
20.0000 mg | ORAL_TABLET | Freq: Every day | ORAL | 0 refills | Status: DC
  Filled 2023-08-02: qty 90, 90d supply, fill #0

## 2023-08-02 MED ORDER — INSULIN ASPART 100 UNIT/ML IJ SOLN
5.0000 [IU] | Freq: Three times a day (TID) | INTRAMUSCULAR | 3 refills | Status: DC
  Filled 2023-08-02: qty 10, 67d supply, fill #0

## 2023-08-02 MED ORDER — HYDROXYZINE HCL 10 MG PO TABS
10.0000 mg | ORAL_TABLET | Freq: Three times a day (TID) | ORAL | 0 refills | Status: DC | PRN
Start: 2023-08-02 — End: 2023-10-25
  Filled 2023-08-02: qty 30, 10d supply, fill #0

## 2023-08-02 MED ORDER — INSULIN SYRINGE-NEEDLE U-100 31G X 15/64" 1 ML MISC
3 refills | Status: DC
Start: 2023-08-02 — End: 2023-11-19
  Filled 2023-08-02: qty 200, fill #0

## 2023-08-02 MED ORDER — LYUMJEV KWIKPEN 100 UNIT/ML ~~LOC~~ SOPN
5.0000 [IU] | PEN_INJECTOR | Freq: Three times a day (TID) | SUBCUTANEOUS | 3 refills | Status: DC
Start: 2023-08-02 — End: 2023-10-01
  Filled 2023-08-02: qty 15, 90d supply, fill #0

## 2023-08-02 NOTE — Assessment & Plan Note (Signed)
 Well controlled with lifestyle measures and omeprazole.

## 2023-08-02 NOTE — Assessment & Plan Note (Signed)
 Blood pressure is at goal for age and co-morbidities.   Recommendations: continue metoprolol  succinate 100 mg daily, olmesartan -hctz 20-12.5 mg daily - BP goal <130/80 - monitor and log blood pressures at home - check around the same time each day in a relaxed setting - Limit salt to <2000 mg/day - Follow DASH eating plan (heart healthy diet) - limit alcohol to 2 standard drinks per day for men and 1 per day for women - avoid tobacco products - get at least 2 hours of regular aerobic exercise weekly Patient aware of signs/symptoms requiring further/urgent evaluation. Future labs ordered.

## 2023-08-02 NOTE — Assessment & Plan Note (Signed)
 Poorly controlled with current regimen. Significant side effects with GLP-1 receptor agonists and SGLT2 inhibitors. Blood sugars average 290 mg/dL. Discussed transition to mealtime insulin  for better management. Insurance will determine insulin  formulation. - Continue Tresiba  at 90 units twice daily. - Switch to extended-release metformin  to reduce gastric distress. - Initiate mealtime insulin  (lispro) at 5 units per meal, adjust based on response and insurance coverage. Start with just using before largest meal of the day, gradually increasing to all 3 meals if hyperglycemia persists.  - Recheck A1c after May 21st. - Schedule lab appointment for full panel after vacation. - Order CBC and microalbumin today.

## 2023-08-02 NOTE — Telephone Encounter (Signed)
 Saw her today. Meds adjusted.

## 2023-08-02 NOTE — Assessment & Plan Note (Signed)
 Labs ordered Encouraged healthy diet and regular exercise

## 2023-08-02 NOTE — Telephone Encounter (Signed)
 NOT [facility] PATIENT

## 2023-08-02 NOTE — Assessment & Plan Note (Signed)
 Medication management: Lipitor 20 mg daily, Omega-3 Lifestyle factors for lowering cholesterol include:  Diet therapy - heart-healthy diet rich in fruits, veggies, fiber-rich whole grains, lean meats, chicken, fish (at least twice a week), fat-free or 1% dairy products; foods low in saturated/trans fats, cholesterol, sodium, and sugar. Mediterranean diet has shown to be very heart healthy. Regular exercise - recommend at least 30 minutes a day, 5 times per week Weight management  Repeat CMP and lipid panel

## 2023-08-02 NOTE — Assessment & Plan Note (Signed)
 Continue supplementation. Labs ordered.

## 2023-08-02 NOTE — Assessment & Plan Note (Signed)
 On Duloxetine  60mg  daily and PRN hydroxyzine . Stable. No SI/HI. -Continue current regimen.

## 2023-08-02 NOTE — Progress Notes (Signed)
 Established Patient Office Visit  Subjective    Patient ID: Kelly Gallagher, female    DOB: June 28, 1970  Age: 53 y.o. MRN: [id number]  CC:  Chief Complaint  Patient presents with   Medical Management of Chronic Issues   Diabetes    HPI Kelly Gallagher presents for routine follow-up with concerns of hyperglycemia. She is planning to go out of the country for vacation and would like to do a trial of adjust her insulin  this week before leaving.    Hypertension: - Medications: metoprolol  succinate 100 mg daily, olmesartan -hctz 20-12.5 mg daily - Compliance: good - Checking BP at home:  - Denies any SOB, recurrent headaches, CP, vision changes, LE edema, dizziness, palpitations, or medication side effects.   Diabetes: - Checking glucose at home: yes, Kelly Gallagher, recently staying >250 since having to stop GLP1 d/t GI side effects - Medications: Tresiba  85-90 units BID, metformin  1,000 mg BID and 500 mg once a day; Januvia  100 mg daily (recently started) - Compliance: good - Diet: low carb - Exercise: minimal, walking, swimming  - Eye exam: UTD - Foot exam: UTD - Microalbumin: ordered - Denies symptoms of hypoglycemia, polyuria, polydipsia, numbness extremities, foot ulcers/trauma, wounds that are not healing, medication side effects  Lab Results  Component Value Date   HGBA1C 8.1 (H) 05/14/2023      Hyperlipidemia: - medications: Lipitor 20 mg daily, Omega-3 - compliance: good - medication SEs: none The ASCVD Risk score (Arnett DK, et al., 2019) failed to calculate for the following reasons:   The valid total cholesterol range is 130 to 320 mg/dL    Mood follow-up: - Diagnosis:anxiety - Treatment: Cymbalta  60 mg daily, hydroxyzine  PRN - Medication side effects: none - SI/HI: none - Update: Stable overall. Recently under a lot of stress the past few weeks, trouble sleeping at times.    GERD: - managing with omeprazole  20 mg daily    Vitamin B12 deficiency: - taking  1,000 mcg daily oral replacement    Vitamin D deficiency: - 2,000 units daily     . Wt Readings from Last 3 Encounters:  08/02/23 241 lb (109.3 kg)  05/14/23 235 lb (106.6 kg)  02/04/23 239 lb 4.8 oz (108.5 kg)        ROS All review of systems negative except what is listed in the HPI      Objective    BP (!) 111/59   Pulse 65   Ht 5\' 5"  (1.651 m)   Wt 241 lb (109.3 kg)   SpO2 99%   BMI 40.10 kg/m   Physical Exam Vitals reviewed.  Constitutional:      Appearance: Normal appearance. She is obese.  Cardiovascular:     Rate and Rhythm: Normal rate and regular rhythm.     Heart sounds: Normal heart sounds.  Pulmonary:     Effort: Pulmonary effort is normal.     Breath sounds: Normal breath sounds.  Skin:    General: Skin is warm and dry.  Neurological:     Mental Status: She is alert and oriented to person, place, and time.  Psychiatric:        Mood and Affect: Mood normal.        Behavior: Behavior normal.        Thought Content: Thought content normal.        Judgment: Judgment normal.         Assessment & Plan:   Problem List Items Addressed This Visit  Active Problems   Type 2 diabetes mellitus, with long-term current use of insulin  (HCC) - Primary (Chronic)   Poorly controlled with current regimen. Significant side effects with GLP-1 receptor agonists and SGLT2 inhibitors. Blood sugars average 290 mg/dL. Discussed transition to mealtime insulin  for better management. Insurance will determine insulin  formulation. - Continue Tresiba  at 90 units twice daily. - Switch to extended-release metformin  to reduce gastric distress. - Initiate mealtime insulin  (lispro) at 5 units per meal, adjust based on response and insurance coverage. Start with just using before largest meal of the day, gradually increasing to all 3 meals if hyperglycemia persists.  - Recheck A1c after May 21st. - Schedule lab appointment for full panel after vacation. - Order  CBC and microalbumin today.      Relevant Medications   metFORMIN  (GLUCOPHAGE -XR) 500 MG 24 hr tablet   Insulin  Syringe-Needle U-100 31G X 15/64" 1 ML MISC   Insulin  Lispro-aabc (LYUMJEV KWIKPEN) 100 UNIT/ML KwikPen   Other Relevant Orders   POCT Glucose (CBG) (Completed)   CBC with Differential/Platelet   Microalbumin / creatinine urine ratio   Hemoglobin A1c   TSH   Hypertension associated with diabetes (HCC) (Chronic)   Blood pressure is at goal for age and co-morbidities.   Recommendations: continue metoprolol  succinate 100 mg daily, olmesartan -hctz 20-12.5 mg daily - BP goal <130/80 - monitor and log blood pressures at home - check around the same time each day in a relaxed setting - Limit salt to <2000 mg/day - Follow DASH eating plan (heart healthy diet) - limit alcohol to 2 standard drinks per day for men and 1 per day for women - avoid tobacco products - get at least 2 hours of regular aerobic exercise weekly Patient aware of signs/symptoms requiring further/urgent evaluation. Future labs ordered.        Relevant Medications   metFORMIN  (GLUCOPHAGE -XR) 500 MG 24 hr tablet   Insulin  Lispro-aabc (LYUMJEV KWIKPEN) 100 UNIT/ML KwikPen   Other Relevant Orders   Comprehensive metabolic panel with GFR   TSH   Generalized anxiety disorder (Chronic)   On Duloxetine  60mg  daily and PRN hydroxyzine . Stable. No SI/HI. -Continue current regimen.       Relevant Medications   hydrOXYzine  (ATARAX ) 10 MG tablet   Hyperlipidemia associated with type 2 diabetes mellitus (HCC) (Chronic)   Medication management: Lipitor 20 mg daily, Omega-3 Lifestyle factors for lowering cholesterol include:  Diet therapy - heart-healthy diet rich in fruits, veggies, fiber-rich whole grains, lean meats, chicken, fish (at least twice a week), fat-free or 1% dairy products; foods low in saturated/trans fats, cholesterol, sodium, and sugar. Mediterranean diet has shown to be very heart healthy. Regular  exercise - recommend at least 30 minutes a day, 5 times per week Weight management  Repeat CMP and lipid panel       Relevant Medications   metFORMIN  (GLUCOPHAGE -XR) 500 MG 24 hr tablet   Insulin  Lispro-aabc (LYUMJEV KWIKPEN) 100 UNIT/ML KwikPen   Other Relevant Orders   Comprehensive metabolic panel with GFR   Lipid panel   Morbid obesity (HCC) (Chronic)   Labs ordered Encouraged healthy diet and regular exercise       Relevant Medications   metFORMIN  (GLUCOPHAGE -XR) 500 MG 24 hr tablet   Insulin  Lispro-aabc (LYUMJEV KWIKPEN) 100 UNIT/ML KwikPen   Other Relevant Orders   Comprehensive metabolic panel with GFR   Hemoglobin A1c   Lipid panel   TSH   GERD (gastroesophageal reflux disease) (Chronic)   Well controlled with lifestyle measures  and omeprazole .       Vitamin B12 deficiency (Chronic)   Continue supplementation. Labs ordered      Relevant Orders   B12 and Folate Panel   Vitamin D deficiency (Chronic)   Continue supplementation. Labs ordered.       Relevant Orders   VITAMIN D 25 Hydroxy (Vit-D Deficiency, Fractures)     Return for cancel PCP appt 5/30 and schedule lab-only appointment that week.   Everlina Hock, NP

## 2023-08-03 ENCOUNTER — Other Ambulatory Visit: Payer: Self-pay

## 2023-08-03 ENCOUNTER — Encounter (HOSPITAL_BASED_OUTPATIENT_CLINIC_OR_DEPARTMENT_OTHER): Payer: Self-pay

## 2023-08-03 ENCOUNTER — Other Ambulatory Visit (HOSPITAL_BASED_OUTPATIENT_CLINIC_OR_DEPARTMENT_OTHER): Payer: Self-pay

## 2023-08-03 ENCOUNTER — Other Ambulatory Visit: Payer: Self-pay | Admitting: Student

## 2023-08-03 ENCOUNTER — Ambulatory Visit: Payer: Self-pay | Admitting: Family Medicine

## 2023-08-03 DIAGNOSIS — F411 Generalized anxiety disorder: Secondary | ICD-10-CM

## 2023-08-03 DIAGNOSIS — E1142 Type 2 diabetes mellitus with diabetic polyneuropathy: Secondary | ICD-10-CM

## 2023-08-03 DIAGNOSIS — E538 Deficiency of other specified B group vitamins: Secondary | ICD-10-CM

## 2023-08-03 DIAGNOSIS — I152 Hypertension secondary to endocrine disorders: Secondary | ICD-10-CM

## 2023-08-03 LAB — CBC WITH DIFFERENTIAL/PLATELET
Basophils Absolute: 0.1 10*3/uL (ref 0.0–0.1)
Basophils Relative: 1.2 % (ref 0.0–3.0)
Eosinophils Absolute: 0.3 10*3/uL (ref 0.0–0.7)
Eosinophils Relative: 3.3 % (ref 0.0–5.0)
HCT: 36.8 % (ref 36.0–46.0)
Hemoglobin: 11.9 g/dL — ABNORMAL LOW (ref 12.0–15.0)
Lymphocytes Relative: 24.3 % (ref 12.0–46.0)
Lymphs Abs: 2.4 10*3/uL (ref 0.7–4.0)
MCHC: 32.3 g/dL (ref 30.0–36.0)
MCV: 86.2 fl (ref 78.0–100.0)
Monocytes Absolute: 0.5 10*3/uL (ref 0.1–1.0)
Monocytes Relative: 5.2 % (ref 3.0–12.0)
Neutro Abs: 6.5 10*3/uL (ref 1.4–7.7)
Neutrophils Relative %: 66 % (ref 43.0–77.0)
Platelets: 300 10*3/uL (ref 150.0–400.0)
RBC: 4.27 Mil/uL (ref 3.87–5.11)
RDW: 14 % (ref 11.5–15.5)
WBC: 9.8 10*3/uL (ref 4.0–10.5)

## 2023-08-03 LAB — MICROALBUMIN / CREATININE URINE RATIO
Creatinine,U: 40.7 mg/dL
Microalb Creat Ratio: UNDETERMINED mg/g (ref 0.0–30.0)
Microalb, Ur: <0.7 mg/dL

## 2023-08-03 MED FILL — Duloxetine HCl Enteric Coated Pellets Cap 60 MG (Base Eq): ORAL | 30 days supply | Qty: 30 | Fill #0 | Status: AC

## 2023-08-13 ENCOUNTER — Other Ambulatory Visit: Payer: Self-pay | Admitting: Family Medicine

## 2023-08-13 ENCOUNTER — Other Ambulatory Visit (HOSPITAL_BASED_OUTPATIENT_CLINIC_OR_DEPARTMENT_OTHER): Payer: Self-pay

## 2023-08-13 DIAGNOSIS — F411 Generalized anxiety disorder: Secondary | ICD-10-CM

## 2023-08-17 ENCOUNTER — Other Ambulatory Visit (HOSPITAL_BASED_OUTPATIENT_CLINIC_OR_DEPARTMENT_OTHER): Payer: Self-pay

## 2023-08-17 ENCOUNTER — Other Ambulatory Visit

## 2023-08-17 ENCOUNTER — Other Ambulatory Visit (HOSPITAL_COMMUNITY): Payer: Self-pay

## 2023-08-20 ENCOUNTER — Ambulatory Visit: Payer: Commercial Managed Care - PPO | Admitting: Family Medicine

## 2023-08-21 ENCOUNTER — Ambulatory Visit
Admission: RE | Admit: 2023-08-21 | Discharge: 2023-08-21 | Disposition: A | Discharge status: Home / Self Care | Source: Ambulatory Visit | Attending: Family Medicine | Admitting: Family Medicine

## 2023-08-21 ENCOUNTER — Other Ambulatory Visit: Payer: Self-pay

## 2023-08-21 VITALS — BP 138/86 | HR 69 | Temp 98.7°F | Resp 18

## 2023-08-21 DIAGNOSIS — J309 Allergic rhinitis, unspecified: Secondary | ICD-10-CM

## 2023-08-21 DIAGNOSIS — J0100 Acute maxillary sinusitis, unspecified: Secondary | ICD-10-CM | POA: Diagnosis not present

## 2023-08-21 DIAGNOSIS — R059 Cough, unspecified: Secondary | ICD-10-CM | POA: Diagnosis not present

## 2023-08-21 MED ORDER — FEXOFENADINE HCL 180 MG PO TABS: 180.0000 mg | ORAL_TABLET | Freq: Every day | ORAL | 0 refills | Status: DC

## 2023-08-21 MED ORDER — AMOXICILLIN-POT CLAVULANATE 875-125 MG PO TABS: 1.0000 | ORAL_TABLET | Freq: Two times a day (BID) | ORAL | 0 refills | Status: DC

## 2023-08-21 MED ORDER — PREDNISONE 10 MG (21) PO TBPK: ORAL_TABLET | Freq: Every day | ORAL | 0 refills | Status: DC

## 2023-08-21 NOTE — ED Provider Notes (Signed)
 [facility]    CSN: [id number] Arrival date & time: 08/21/23  1355      History   Chief Complaint Chief Complaint  Patient presents with   Nasal Congestion   Cough    HPI Kelly Gallagher is a 53 y.o. female.   HPI pleasant 53 year old female presents with nasal congestion and cough for 1 week.  PMH significant for morbid obesity, T2DM, and HTN.  Patient is accompanied by her husband this afternoon.  Past Medical History:  Diagnosis Date   Allergy 12/30/2020   seasonal   Anxiety 12/30/2020   Depression    Diabetes mellitus without complication (HCC)    GERD (gastroesophageal reflux disease)    Hypertension     Patient Active Problem List   Diagnosis Date Noted   Vitamin D deficiency 08/02/2023   Wrist pain, acute, right 11/11/2021   Intertrigo 11/11/2021   Neutrophilia 08/14/2021   Hypotension 08/14/2021   Hypokalemia 08/14/2021   Hypercalcemia 08/14/2021   GI symptoms 04/15/2021   Weight gain 07/25/2020   Vaginal candidiasis 04/25/2019   Healthcare maintenance 04/29/2018   Type 2 diabetes mellitus, with long-term current use of insulin  (HCC) 04/28/2018   Hypertension associated with diabetes (HCC) 04/28/2018   Generalized anxiety disorder 04/28/2018   Hyperlipidemia associated with type 2 diabetes mellitus (HCC) 04/28/2018   Morbid obesity (HCC) 04/28/2018   GERD (gastroesophageal reflux disease) 04/28/2018   Reactive airway disease 04/28/2018   Vitamin B12 deficiency 04/28/2018    Past Surgical History:  Procedure Laterality Date   CHOLECYSTECTOMY     COLONOSCOPY  01/13/2021   JMP   DILATION AND CURETTAGE OF UTERUS     KNEE ARTHROSCOPY W/ MENISCAL REPAIR Right 05/2010    OB History     Gravida  1   Para      Term      Preterm      AB  1   Living         SAB  1   IAB      Ectopic      Multiple      Live Births           Obstetric Comments  "chemical pregnancy"          Home Medications    Prior to  Admission medications   Medication Sig Start Date End Date Taking? Authorizing Provider  amoxicillin-clavulanate (AUGMENTIN) 875-125 MG tablet Take 1 tablet by mouth every 12 (twelve) hours. 08/21/23  Yes Leonides Ramp, FNP  fexofenadine [facility] ALLERGY) 180 MG tablet Take 1 tablet (180 mg total) by mouth daily for 15 days. 08/21/23 09/05/23 Yes Leonides Ramp, FNP  predniSONE  (STERAPRED UNI-PAK 21 TAB) 10 MG (21) TBPK tablet Take by mouth daily. Take 6 tabs by mouth daily  for 2 days, then 5 tabs for 2 days, then 4 tabs for 2 days, then 3 tabs for 2 days, 2 tabs for 2 days, then 1 tab by mouth daily for 2 days 08/21/23  Yes Leonides Ramp, FNP  atorvastatin  (LIPITOR) 20 MG tablet Take 1 tablet (20 mg total) by mouth daily. 08/02/23   Everlina Hock, NP  Cholecalciferol (VITAMIN D3) 10 MCG (400 UNIT) tablet daily. 05/05/19   [provider]  Continuous Glucose Sensor (FREESTYLE LIBRE 3 SENSOR) MISC Place 1 sensor on the skin every 14 days. Use to check glucose continuously 10/26/22   Ronni Colace, DO  DULoxetine  (CYMBALTA ) 60 MG capsule Take 1 capsule (60 mg total) by mouth  daily. 08/03/23   Everlina Hock, NP  hydrOXYzine  (ATARAX ) 10 MG tablet Take 1 tablet (10 mg total) by mouth 3 (three) times daily as needed. 08/02/23   Everlina Hock, NP  insulin  degludec (TRESIBA  FLEXTOUCH) 200 UNIT/ML FlexTouch Pen Inject 80 Units into the skin in the morning and at bedtime. Patient taking differently: Inject 85-90 Units into the skin in the morning and at bedtime. 06/01/23   Everlina Hock, NP  Insulin  Lispro-aabc (LYUMJEV  KWIKPEN) 100 UNIT/ML KwikPen Inject 5 Units into the skin with breakfast, with lunch, and with evening meal. For the first few days, start with only using before largest meal of the day, if tolerating well and blood sugars are still high, gradually increase to using with all 3 meals a day. Monitor for signs of hypoglycemia. 08/02/23   Everlina Hock, NP  Insulin  Pen Needle 32G X 4 MM MISC Used  to inject insulin  twice dailys 04/06/23   Ronni Colace, DO  Insulin  Syringe-Needle U-100 31G X 15/64" 1 ML MISC Use to inject insulin  twice daily 09/28/22   Masters, Alston Jerry, DO  Insulin  Syringe-Needle U-100 31G X 15/64" 1 ML MISC Use to inject insulin  with meals 08/02/23   Everlina Hock, NP  metFORMIN  (GLUCOPHAGE ) 1000 MG tablet Take 1 tablet (1,000 mg total) by mouth 2 (two) times daily with a meal. 05/14/23   Everlina Hock, NP  metFORMIN  (GLUCOPHAGE -XR) 500 MG 24 hr tablet Take 1 tablet (500 mg total) by mouth daily with breakfast. 08/02/23   Everlina Hock, NP  metoprolol  succinate (TOPROL -XL) 100 MG 24 hr tablet Take 1 tablet (100 mg total) by mouth daily. WITH OR IMMEDIATELY FOLLOWING A MEAL 12/10/22 12/10/23  Ronni Colace, DO  Multiple Vitamin (MULTIVITAMIN) tablet Take 1 tablet by mouth daily.    [provider]  olmesartan -hydrochlorothiazide  (BENICAR  HCT) 20-12.5 MG tablet Take 1 tablet by mouth daily. 03/10/23 03/09/24  Ronni Colace, DO  Omega-3 Fatty Acids  (FISH OIL ) 1000 MG CAPS Take 1 capsule (1,000 mg total) by mouth daily. 04/23/21   Melodye Spurr, MD  omeprazole  (PRILOSEC) 20 MG capsule Take 1 capsule (20 mg total) by mouth daily. 05/14/23   Everlina Hock, NP  ondansetron  (ZOFRAN -ODT) 4 MG disintegrating tablet Dissolve 1 tablet (4 mg total) by mouth every 8 (eight) hours as needed for vomiting or nausea. 07/12/23   Charmayne Cooper, MD  Probiotic Product (PROBIOTIC-10 PO) Take by mouth.    [provider]  sitaGLIPtin  (JANUVIA ) 100 MG tablet Take 1 tablet (100 mg total) by mouth daily. 07/14/23   Everlina Hock, NP  vitamin B-12 (CYANOCOBALAMIN ) 1000 MCG tablet Take by mouth.    [provider]    Family History Family History  Problem Relation Age of Onset   Colon polyps Father    Cancer Father        Head and neck cancer. Has PEG   Hearing loss Father    Lupus Sister    Breast cancer Other    Breast cancer Other    Anxiety disorder Mother     Depression Mother    Diabetes Mother    Early death Mother    Hypertension Mother    Miscarriages / [place] Mother    Anxiety disorder Sister    Diabetes Sister    Hypertension Sister    Arthritis Brother    Cancer Paternal Uncle    Colon cancer Neg Hx    Esophageal cancer Neg Hx  Rectal cancer Neg Hx    Stomach cancer Neg Hx     Social History Social History   Tobacco Use   Smoking status: Never   Smokeless tobacco: Never  Vaping Use   Vaping status: Never Used  Substance Use Topics   Alcohol use: Never    Alcohol/week: 1.0 standard drink of alcohol    Types: 1 Glasses of wine per week   Drug use: Never     Allergies   Ace inhibitors and Effexor xr [venlafaxine hcl]   Review of Systems Review of Systems  HENT:  Positive for congestion.   Respiratory:  Positive for cough.   All other systems reviewed and are negative.    Physical Exam Triage Vital Signs ED Triage Vitals  Encounter Vitals Group     BP      Systolic BP Percentile      Diastolic BP Percentile      Pulse      Resp      Temp      Temp src      SpO2      Weight      Height      Head Circumference      Peak Flow      Pain Score      Pain Loc      Pain Education      Exclude from Growth Chart    No data found.  Updated Vital Signs BP 138/86 (BP Location: Right Arm)   Pulse 69   Temp 98.7 F (37.1 C) (Oral)   Resp 18   SpO2 94%    Physical Exam Vitals and nursing note reviewed.  Constitutional:      Appearance: Normal appearance. She is obese. She is ill-appearing.  HENT:     Head: Normocephalic and atraumatic.     Right Ear: Tympanic membrane and external ear normal.     Left Ear: Tympanic membrane and external ear normal.     Ears:     Comments: Significant eustachian tube dysfunction noted bilaterally    Mouth/Throat:     Mouth: Mucous membranes are moist.     Pharynx: Oropharynx is clear.     Comments: Significant amount of clear drainage of posterior  oropharynx noted Eyes:     Extraocular Movements: Extraocular movements intact.     Conjunctiva/sclera: Conjunctivae normal.     Pupils: Pupils are equal, round, and reactive to light.  Cardiovascular:     Rate and Rhythm: Normal rate and regular rhythm.     Pulses: Normal pulses.     Heart sounds: Normal heart sounds.  Pulmonary:     Effort: Pulmonary effort is normal.     Breath sounds: Normal breath sounds. No wheezing, rhonchi or rales.     Comments: Frequent nonproductive cough on exam Musculoskeletal:        General: Normal range of motion.  Skin:    General: Skin is warm and dry.  Neurological:     General: No focal deficit present.     Mental Status: She is alert and oriented to person, place, and time. Mental status is at baseline.  Psychiatric:        Mood and Affect: Mood normal.      UC Treatments / Results  Labs (all labs ordered are listed, but only abnormal results are displayed) Labs Reviewed - No data to display  EKG   Radiology No results found.  Procedures Procedures (including critical care time)  Medications Ordered in UC Medications - No data to display  Initial Impression / Assessment and Plan / UC Course  I have reviewed the triage vital signs and the nursing notes.  Pertinent labs & imaging results that were available during my care of the patient were reviewed by me and considered in my medical decision making (see chart for details).     MDM: 1.  Acute maxillary sinusitis, recurrence not specified-Rx'd Augmentin 875/125 mg tablet: Take 1 tablet twice daily x 7 days; 2.  Cough, unspecified type-Rx'd steroid Unipak (21 tab 10 mg taper); 3.  Allergic rhinitis, unspecified seasonality, unspecified trigger-Rx'd Allegra 180 mg tablet: Take 1 tablet daily x 5 days, then as needed. Instructed patient to stop Claritin.  Advised patient to take medications as directed with food to completion.  Advised patient to take prednisone  and Allegra with first  dose of Augmentin.  Advised may discontinue Allegra after 3 to 5 days and use as needed for concurrent postnasal drainage/drip/runny nose/sneezing.  Encouraged to increase daily water intake to 64 ounces per day while taking these medications.  Advised if symptoms worsen and/or under resolved please follow-up with your PCP or here for further evaluation.  Final Clinical Impressions(s) / UC Diagnoses   Final diagnoses:  Acute maxillary sinusitis, recurrence not specified  Cough, unspecified type  Allergic rhinitis, unspecified seasonality, unspecified trigger     Discharge Instructions      Instructed patient to stop Claritin.  Advised patient to take medications as directed with food to completion.  Advised patient to take prednisone  and Allegra with first dose of Augmentin.  Advised may discontinue Allegra after 3 to 5 days and use as needed for concurrent postnasal drainage/drip/runny nose/sneezing.  Encouraged to increase daily water intake to 64 ounces per day while taking these medications.  Advised if symptoms worsen and/or under resolved please follow-up with your PCP or here for further evaluation.   ED Prescriptions     Medication Sig Dispense Auth. Provider   amoxicillin-clavulanate (AUGMENTIN) 875-125 MG tablet Take 1 tablet by mouth every 12 (twelve) hours. 14 tablet Jefrey Raburn, FNP   predniSONE  (STERAPRED UNI-PAK 21 TAB) 10 MG (21) TBPK tablet Take by mouth daily. Take 6 tabs by mouth daily  for 2 days, then 5 tabs for 2 days, then 4 tabs for 2 days, then 3 tabs for 2 days, 2 tabs for 2 days, then 1 tab by mouth daily for 2 days 42 tablet Leonides Ramp, FNP   fexofenadine (ALLEGRA ALLERGY) 180 MG tablet Take 1 tablet (180 mg total) by mouth daily for 15 days. 15 tablet Lilinoe Acklin, FNP      PDMP not reviewed this encounter.   Leonides Ramp, FNP 08/21/23 1440

## 2023-08-21 NOTE — Discharge Instructions (Addendum)
 Instructed patient to stop Claritin.  Advised patient to take medications as directed with food to completion.  Advised patient to take prednisone  and Allegra with first dose of Augmentin.  Advised may discontinue Allegra after 3 to 5 days and use as needed for concurrent postnasal drainage/drip/runny nose/sneezing.  Encouraged to increase daily water intake to 64 ounces per day while taking these medications.  Advised if symptoms worsen and/or under resolved please follow-up with your PCP or here for further evaluation.

## 2023-08-21 NOTE — ED Triage Notes (Signed)
 Pt c/o cough and congestion x 1 week. No known fever.  Dayquil and elderberry vits prn.

## 2023-08-23 ENCOUNTER — Ambulatory Visit: Admitting: Family Medicine

## 2023-08-23 VITALS — BP 127/69 | Ht 65.0 in | Wt 242.0 lb

## 2023-08-23 DIAGNOSIS — M25562 Pain in left knee: Secondary | ICD-10-CM

## 2023-08-23 NOTE — Patient Instructions (Addendum)
 We will go ahead with an MRI of your knee. You can call  [phone] to schedule this at the [facility]  I'm concerned you may have torn one of the hamstring tendons on the outside. Icing 15 minutes at a time 3-4 times a day. Continue the prednisone  as you have been. Take tylenol  if needed. Knee brace or sleeve for support when up and walking around. Next steps will depend on the MRI.

## 2023-08-23 NOTE — Progress Notes (Signed)
 PCP: Everlina Hock, NP  Subjective:   HPI: Patient is a 53 y.o. female here for left knee pain.  Kelly Gallagher states that 10 days ago she was walking on a cruise when she felt a popping in her knee accompanied by acute sharp knee pain. Afterwards she was unable to bear weight on the leg and currently is requiring a cane to ambulate. The knee swelled afterwards and also had some minor bruising that tracked down into her ankle. She states the pain is specifically in the posteromedial aspect of her knee but she also endorses paraesthesias running down the lateral aspect of her leg into the dorsum of her foot. Her knee is still painful to walk on and buckling occasionally. She is awakening at night with pain occasionally. She has been using ibuprofen  to treat the pain and is currently on prednisone  for a URI which she states is helping with her swelling.   Past Medical History:  Diagnosis Date   Allergy 12/30/2020   seasonal   Anxiety 12/30/2020   Depression    Diabetes mellitus without complication (HCC)    GERD (gastroesophageal reflux disease)    Hypertension     Current Outpatient Medications on File Prior to Visit  Medication Sig Dispense Refill   amoxicillin-clavulanate (AUGMENTIN) 875-125 MG tablet Take 1 tablet by mouth every 12 (twelve) hours. 14 tablet 0   atorvastatin  (LIPITOR) 20 MG tablet Take 1 tablet (20 mg total) by mouth daily. 90 tablet 0   Cholecalciferol (VITAMIN D3) 10 MCG (400 UNIT) tablet daily.     Continuous Glucose Sensor (FREESTYLE LIBRE 3 SENSOR) MISC Place 1 sensor on the skin every 14 days. Use to check glucose continuously 2 each 11   DULoxetine  (CYMBALTA ) 60 MG capsule Take 1 capsule (60 mg total) by mouth daily. 30 capsule 5   fexofenadine (ALLEGRA ALLERGY) 180 MG tablet Take 1 tablet (180 mg total) by mouth daily for 15 days. 15 tablet 0   hydrOXYzine  (ATARAX ) 10 MG tablet Take 1 tablet (10 mg total) by mouth 3 (three) times daily as needed. 30 tablet 0   insulin   degludec (TRESIBA  FLEXTOUCH) 200 UNIT/ML FlexTouch Pen Inject 80 Units into the skin in the morning and at bedtime. (Patient taking differently: Inject 85-90 Units into the skin in the morning and at bedtime.) 72 mL 1   Insulin  Lispro-aabc (LYUMJEV  KWIKPEN) 100 UNIT/ML KwikPen Inject 5 Units into the skin with breakfast, with lunch, and with evening meal. For the first few days, start with only using before largest meal of the day, if tolerating well and blood sugars are still high, gradually increase to using with all 3 meals a day. Monitor for signs of hypoglycemia. 15 mL 3   Insulin  Pen Needle 32G X 4 MM MISC Used to inject insulin  twice dailys 200 each 3   Insulin  Syringe-Needle U-100 31G X 15/64" 1 ML MISC Use to inject insulin  twice daily 200 each 3   Insulin  Syringe-Needle U-100 31G X 15/64" 1 ML MISC Use to inject insulin  with meals 200 each 3   metFORMIN  (GLUCOPHAGE ) 1000 MG tablet Take 1 tablet (1,000 mg total) by mouth 2 (two) times daily with a meal. 60 tablet 3   metFORMIN  (GLUCOPHAGE -XR) 500 MG 24 hr tablet Take 1 tablet (500 mg total) by mouth daily with breakfast. 90 tablet 1   metoprolol  succinate (TOPROL -XL) 100 MG 24 hr tablet Take 1 tablet (100 mg total) by mouth daily. WITH OR IMMEDIATELY FOLLOWING A MEAL 90 tablet  3   Multiple Vitamin (MULTIVITAMIN) tablet Take 1 tablet by mouth daily.     olmesartan -hydrochlorothiazide  (BENICAR  HCT) 20-12.5 MG tablet Take 1 tablet by mouth daily. 90 tablet 3   Omega-3 Fatty Acids  (FISH OIL ) 1000 MG CAPS Take 1 capsule (1,000 mg total) by mouth daily. 90 capsule 0   omeprazole  (PRILOSEC) 20 MG capsule Take 1 capsule (20 mg total) by mouth daily. 90 capsule 0   ondansetron  (ZOFRAN -ODT) 4 MG disintegrating tablet Dissolve 1 tablet (4 mg total) by mouth every 8 (eight) hours as needed for vomiting or nausea. 20 tablet 0   predniSONE  (STERAPRED UNI-PAK 21 TAB) 10 MG (21) TBPK tablet Take by mouth daily. Take 6 tabs by mouth daily  for 2 days, then 5  tabs for 2 days, then 4 tabs for 2 days, then 3 tabs for 2 days, 2 tabs for 2 days, then 1 tab by mouth daily for 2 days 42 tablet 0   Probiotic Product (PROBIOTIC-10 PO) Take by mouth.     sitaGLIPtin  (JANUVIA ) 100 MG tablet Take 1 tablet (100 mg total) by mouth daily. 90 tablet 3   vitamin B-12 (CYANOCOBALAMIN ) 1000 MCG tablet Take by mouth.     No current facility-administered medications on file prior to visit.    Past Surgical History:  Procedure Laterality Date   CHOLECYSTECTOMY     COLONOSCOPY  01/13/2021   JMP   DILATION AND CURETTAGE OF UTERUS     KNEE ARTHROSCOPY W/ MENISCAL REPAIR Right 05/2010    Allergies  Allergen Reactions   Ace Inhibitors Anaphylaxis   Effexor Xr [Venlafaxine Hcl]     Patient states caused symptomatic elevated serotonin levels    BP 127/69 (BP Location: Left Arm, Patient Position: Sitting)   Ht 5\' 5"  (1.651 m)   Wt 242 lb (109.8 kg)   BMI 40.27 kg/m       No data to display              No data to display              Objective:  Physical Exam:  Gen: NAD, comfortable in exam room  MSK: Left knee: Inspection 1+ pitting edema.  No bruising, other deformity. Palpation tender to palpation medial hamstring tendons.  No joint line, other tenderness ROM 0 - 120 degrees.  Mild weakness knee flexion. Strength 5/5 knee extension Special Tests Negative lachman, posterior/anterior drawer, lever, mcmurrays, apleys.   Assessment & Plan:  Patient is a 53 y.o. female here for left knee injury  1. Left knee injury - mechanism, exam most concerning for distal hamstring tendon tear.  ACL testing reassuring.  Meniscus testing negative though this is a less likely consideration.  Will proceed with MRI to assess.  Icing, tylenol , continue her prednisone .  Knee brace for support.

## 2023-08-27 ENCOUNTER — Telehealth (HOSPITAL_BASED_OUTPATIENT_CLINIC_OR_DEPARTMENT_OTHER): Payer: Self-pay

## 2023-09-04 ENCOUNTER — Other Ambulatory Visit: Payer: Self-pay

## 2023-09-04 MED FILL — Duloxetine HCl Enteric Coated Pellets Cap 60 MG (Base Eq): ORAL | 30 days supply | Qty: 30 | Fill #1 | Status: AC

## 2023-09-06 ENCOUNTER — Encounter: Payer: Self-pay | Admitting: Family Medicine

## 2023-09-06 ENCOUNTER — Other Ambulatory Visit (HOSPITAL_BASED_OUTPATIENT_CLINIC_OR_DEPARTMENT_OTHER): Payer: Self-pay

## 2023-09-06 MED ORDER — FREESTYLE LIBRE 3 PLUS SENSOR MISC
1 refills | Status: DC
  Filled 2023-09-06: qty 6, fill #0
  Filled 2023-09-09 (×2): qty 6, 90d supply, fill #0
  Filled 2023-12-01: qty 6, 90d supply, fill #1
  Filled 2023-12-02: qty 2, 30d supply, fill #1
  Filled 2023-12-02: qty 6, 90d supply, fill #1
  Filled 2024-01-05: qty 2, 30d supply, fill #2
  Filled 2024-02-09: qty 2, 30d supply, fill #3

## 2023-09-09 ENCOUNTER — Encounter (HOSPITAL_BASED_OUTPATIENT_CLINIC_OR_DEPARTMENT_OTHER): Payer: Self-pay

## 2023-09-09 ENCOUNTER — Other Ambulatory Visit (HOSPITAL_BASED_OUTPATIENT_CLINIC_OR_DEPARTMENT_OTHER): Payer: Self-pay

## 2023-09-13 ENCOUNTER — Other Ambulatory Visit (HOSPITAL_BASED_OUTPATIENT_CLINIC_OR_DEPARTMENT_OTHER): Payer: Self-pay

## 2023-09-17 ENCOUNTER — Other Ambulatory Visit (HOSPITAL_COMMUNITY): Payer: Self-pay

## 2023-09-30 ENCOUNTER — Other Ambulatory Visit (INDEPENDENT_AMBULATORY_CARE_PROVIDER_SITE_OTHER)

## 2023-09-30 DIAGNOSIS — E1142 Type 2 diabetes mellitus with diabetic polyneuropathy: Secondary | ICD-10-CM

## 2023-09-30 DIAGNOSIS — E1159 Type 2 diabetes mellitus with other circulatory complications: Secondary | ICD-10-CM

## 2023-09-30 DIAGNOSIS — E785 Hyperlipidemia, unspecified: Secondary | ICD-10-CM | POA: Diagnosis not present

## 2023-09-30 DIAGNOSIS — E1169 Type 2 diabetes mellitus with other specified complication: Secondary | ICD-10-CM | POA: Diagnosis not present

## 2023-09-30 DIAGNOSIS — E538 Deficiency of other specified B group vitamins: Secondary | ICD-10-CM

## 2023-09-30 DIAGNOSIS — Z794 Long term (current) use of insulin: Secondary | ICD-10-CM

## 2023-09-30 DIAGNOSIS — E559 Vitamin D deficiency, unspecified: Secondary | ICD-10-CM | POA: Diagnosis not present

## 2023-09-30 DIAGNOSIS — I152 Hypertension secondary to endocrine disorders: Secondary | ICD-10-CM

## 2023-09-30 LAB — LIPID PANEL
Cholesterol: 119 mg/dL (ref 0–200)
HDL: 43.2 mg/dL (ref >39.00)
LDL Cholesterol: 24 mg/dL (ref 0–99)
NonHDL: 76.21
Total CHOL/HDL Ratio: 3
Triglycerides: 259 mg/dL — ABNORMAL HIGH (ref 0.0–149.0)
VLDL: 51.8 mg/dL — ABNORMAL HIGH (ref 0.0–40.0)

## 2023-09-30 LAB — COMPREHENSIVE METABOLIC PANEL WITH GFR
ALT: 25 U/L (ref 0–35)
AST: 23 U/L (ref 0–37)
Albumin: 4.4 g/dL (ref 3.5–5.2)
Alkaline Phosphatase: 64 U/L (ref 39–117)
BUN: 11 mg/dL (ref 6–23)
CO2: 34 meq/L — ABNORMAL HIGH (ref 19–32)
Calcium: 9.6 mg/dL (ref 8.4–10.5)
Chloride: 97 meq/L (ref 96–112)
Creatinine, Ser: 0.73 mg/dL (ref 0.40–1.20)
GFR: 95.27 mL/min (ref >60.00)
Glucose, Bld: 288 mg/dL — ABNORMAL HIGH (ref 70–99)
Potassium: 4.4 meq/L (ref 3.5–5.1)
Sodium: 137 meq/L (ref 135–145)
Total Bilirubin: 0.4 mg/dL (ref 0.2–1.2)
Total Protein: 6.9 g/dL (ref 6.0–8.3)

## 2023-09-30 LAB — VITAMIN D 25 HYDROXY (VIT D DEFICIENCY, FRACTURES): VITD: 33.54 ng/mL (ref 30.00–100.00)

## 2023-09-30 LAB — B12 AND FOLATE PANEL
Folate: 22.3 ng/mL (ref >5.9)
Vitamin B-12: 202 pg/mL — ABNORMAL LOW (ref 211–911)

## 2023-09-30 LAB — TSH: TSH: 2.92 u[IU]/mL (ref 0.35–5.50)

## 2023-09-30 LAB — HEMOGLOBIN A1C: Hgb A1c MFr Bld: 11.9 % — ABNORMAL HIGH (ref 4.6–6.5)

## 2023-10-01 ENCOUNTER — Ambulatory Visit

## 2023-10-01 ENCOUNTER — Other Ambulatory Visit (HOSPITAL_COMMUNITY): Payer: Self-pay

## 2023-10-01 ENCOUNTER — Encounter: Payer: Self-pay | Admitting: Family Medicine

## 2023-10-01 ENCOUNTER — Other Ambulatory Visit: Payer: Self-pay

## 2023-10-01 ENCOUNTER — Other Ambulatory Visit: Payer: Self-pay | Admitting: Family Medicine

## 2023-10-01 DIAGNOSIS — E1142 Type 2 diabetes mellitus with diabetic polyneuropathy: Secondary | ICD-10-CM

## 2023-10-01 DIAGNOSIS — E538 Deficiency of other specified B group vitamins: Secondary | ICD-10-CM | POA: Diagnosis not present

## 2023-10-01 MED ORDER — TRESIBA FLEXTOUCH 200 UNIT/ML ~~LOC~~ SOPN
90.0000 [IU] | PEN_INJECTOR | Freq: Two times a day (BID) | SUBCUTANEOUS | 3 refills | Status: DC
  Filled 2023-10-01 – 2023-10-06 (×2): qty 81, 90d supply, fill #0
  Filled 2023-10-07: qty 27, 30d supply, fill #0

## 2023-10-01 MED ORDER — METFORMIN HCL 1000 MG PO TABS
1000.0000 mg | ORAL_TABLET | Freq: Two times a day (BID) | ORAL | 3 refills | Status: DC
Start: 2023-10-01 — End: 2024-01-27
  Filled 2023-10-01: qty 60, 30d supply, fill #0
  Filled 2023-10-25: qty 60, 30d supply, fill #1
  Filled 2023-12-01 – 2023-12-02 (×2): qty 60, 30d supply, fill #2
  Filled 2023-12-28: qty 60, 30d supply, fill #3

## 2023-10-01 MED ORDER — LYUMJEV KWIKPEN 100 UNIT/ML ~~LOC~~ SOPN
15.0000 [IU] | PEN_INJECTOR | Freq: Three times a day (TID) | SUBCUTANEOUS | 3 refills | Status: DC
  Filled 2023-10-01 – 2023-10-06 (×4): qty 15, 34d supply, fill #0
  Filled 2023-10-07: qty 12, 27d supply, fill #0

## 2023-10-01 NOTE — Telephone Encounter (Signed)
 Pended for this dosage, sign if appropriate.

## 2023-10-01 NOTE — Addendum Note (Signed)
 Addended by: ALMARIE BIRMINGHAM B on: 10/01/2023 01:26 PM   Modules accepted: Orders

## 2023-10-03 LAB — INTRINSIC FACTOR ANTIBODIES: Intrinsic Factor: NEGATIVE

## 2023-10-04 ENCOUNTER — Other Ambulatory Visit (HOSPITAL_COMMUNITY): Payer: Self-pay

## 2023-10-04 ENCOUNTER — Encounter: Payer: Self-pay | Admitting: Pharmacist

## 2023-10-04 ENCOUNTER — Ambulatory Visit (INDEPENDENT_AMBULATORY_CARE_PROVIDER_SITE_OTHER): Admitting: Pharmacist

## 2023-10-04 DIAGNOSIS — E538 Deficiency of other specified B group vitamins: Secondary | ICD-10-CM

## 2023-10-04 DIAGNOSIS — M25562 Pain in left knee: Secondary | ICD-10-CM

## 2023-10-04 DIAGNOSIS — E1142 Type 2 diabetes mellitus with diabetic polyneuropathy: Secondary | ICD-10-CM

## 2023-10-04 DIAGNOSIS — Z794 Long term (current) use of insulin: Secondary | ICD-10-CM

## 2023-10-04 NOTE — Progress Notes (Signed)
 10/04/2023 Name: Kelly Gallagher MRN: [id number] DOB: 07-15-1970  Chief Complaint  Patient presents with   Diabetes    Kelly Gallagher is a 53 y.o. year old female who presented for a telephone visit.   They were referred to the pharmacist by their PCP for assistance in managing diabetes.    Subjective:   Care Team: Primary Care Provider: Almarie Waddell NOVAK, NP ; Next Scheduled Visit: not currently scheduled.   Medication Access/Adherence  Current Pharmacy:  [facility] - [facility] [address] [place] [address] Phone: [phone] Fax: [phone]  [facility] [place] [facility] [address] [place] [address] Phone: [phone] Fax: [phone]  [facility] [phone] - [place] - [address] AT [facility] [address] [place] [address] Phone: [phone] Fax: [phone]   Patient reports affordability concerns with their medications: No  - Patient is currently enrolled in Diabetes Management Program for [employer].  Patient reports access/transportation concerns to their pharmacy: No    Diabetes:  Current medications:  Tresiba  90 units twice a day Insulin  lyumjev  - 15 units with each meal 3 times a day Metformin  1000mg  twice a day Metformin  ER 500mg  once a day Januvia  100mg  once a day  Medications tried in the past:  Mounjaro  and Ozempic  - caused severe nausea and diarrhea - seen at hospital for dehydration. She has been off GLP 1 type agent since around April 2025 and has gained about 20lbs back.  Jardiance  25mg  - took around 12/2021 but stopped due to frequent yeast infections.   Current glucose readings: see Continuous Glucose Monitor report below Using Libre 3+ sensors  Date of Download: 10/04/2023 % Time CGM is active: 96% Average Glucose: 316 mg/dL Glucose Management Indicator: 10.9%  Glucose Variability: 18.3% (goal  <36%) Time in Goal:  - Time in range 70-180: 1% - Time above range: 99% - Time below range: 0% Observed patterns: Blood glucose has improved over the last 7 days since insulin  dose has been increased.       Diet: patient has been trying very hard to limit portion sizes and carbs. Since she had to stop GLP1 agents it has been harder to tune out the food noise and ignore cravings.  She eats eggs most mornings.   Current medication access support: none  Lab Results  Component Value Date   VITAMINB12 202 (L) 09/30/2023   Previous B12 was 161 (05/14/2023) Patient had stopped all supplements when she was having GI issues. She has recently added back daily MVI and B Complex.    Objective:  Lab Results  Component Value Date   HGBA1C 11.9 (H) 09/30/2023    Lab Results  Component Value Date   CREATININE 0.73 09/30/2023   BUN 11 09/30/2023   NA 137 09/30/2023   K 4.4 09/30/2023   CL 97 09/30/2023   CO2 34 (H) 09/30/2023    Lab Results  Component Value Date   CHOL 119 09/30/2023   HDL 43.20 09/30/2023   LDLCALC 24 09/30/2023   TRIG 259.0 (H) 09/30/2023   CHOLHDL 3 09/30/2023    Medications Reviewed Today     Reviewed by Carla Milling, RPH-CPP (Pharmacist) on 10/04/23 at 1538  Med List Status: <None>   Medication Order Taking? Sig Documenting Provider Last Dose Status Informant   Patient not taking:   Discontinued 10/04/23 1538 (Completed Course)  atorvastatin  (LIPITOR) 20 MG tablet [id number]  Take 1 tablet (20 mg total) by mouth daily. Almarie Waddell NOVAK, NP  Active   B Complex Vitamins (B COMPLEX PO) [id number] Yes Take 1 capsule by mouth daily. [provider]  Active   Cholecalciferol (VITAMIN D3) 10 MCG (400 UNIT) tablet [id number]  daily. [provider]  Active   Continuous Glucose Sensor (FREESTYLE LIBRE 3 PLUS SENSOR) MISC [id number]  Change sensor every 15 days. Almarie Waddell NOVAK, NP  Active   DULoxetine  (CYMBALTA ) 60 MG capsule [id number]  Take 1  capsule (60 mg total) by mouth daily. Almarie Waddell NOVAK, NP  Active   fexofenadine  (ALLEGRA  ALLERGY) 180 MG tablet [id number]  Take 1 tablet (180 mg total) by mouth daily for 15 days. Teddy Sharper, FNP  Expired 09/05/23 2359   hydrOXYzine  (ATARAX ) 10 MG tablet [id number]  Take 1 tablet (10 mg total) by mouth 3 (three) times daily as needed. Almarie Waddell NOVAK, NP  Active   insulin  degludec (TRESIBA  FLEXTOUCH) 200 UNIT/ML FlexTouch Pen [id number]  Inject 90 Units into the skin in the morning and at bedtime. Almarie Waddell NOVAK, NP  Active   Insulin  Lispro-aabc (LYUMJEV  KWIKPEN) 100 UNIT/ML KwikPen [id number]  Inject 15 Units into the skin with breakfast, with lunch, and with evening meal. Almarie Waddell NOVAK, NP  Active   Insulin  Pen Needle 32G X 4 MM MISC [id number]  Used to inject insulin  twice dailys Marylu Gee, DO  Active   Insulin  Syringe-Needle U-100 31G X 15/64 1 ML MISC [id number]  Use to inject insulin  twice daily Masters, Izetta, DO  Active   Insulin  Syringe-Needle U-100 31G X 15/64 1 ML MISC [id number]  Use to inject insulin  with meals Almarie Waddell NOVAK, NP  Active   metFORMIN  (GLUCOPHAGE ) 1000 MG tablet [id number]  Take 1 tablet (1,000 mg total) by mouth 2 (two) times daily with a meal. Almarie Waddell NOVAK, NP  Active   metFORMIN  (GLUCOPHAGE -XR) 500 MG 24 hr tablet [id number]  Take 1 tablet (500 mg total) by mouth daily with breakfast. Almarie Waddell NOVAK, NP  Active   metoprolol  succinate (TOPROL -XL) 100 MG 24 hr tablet [id number]  Take 1 tablet (100 mg total) by mouth daily. WITH OR IMMEDIATELY FOLLOWING A MEAL Marylu Gee, [place]  Active     Discontinued 10/04/23 1536 (Duplicate)   Multiple Vitamins-Calcium  (ONE-A-DAY WOMENS FORMULA PO) [id number] Yes Take 1 tablet by mouth daily. [provider]  Active   olmesartan -hydrochlorothiazide  (BENICAR  HCT) 20-12.5 MG tablet [id number]  Take 1 tablet by mouth daily. Marylu Gee, DO  Active   Omega-3 Fatty Acids  (FISH OIL ) 1000 MG CAPS [id number]  Take 1 capsule (1,000 mg  total) by mouth daily. Sherlean Failing, MD  Active   omeprazole  (PRILOSEC) 20 MG capsule [id number]  Take 1 capsule (20 mg total) by mouth daily. Almarie Waddell NOVAK, NP  Active   ondansetron  (ZOFRAN -ODT) 4 MG disintegrating tablet [id number] Yes Dissolve 1 tablet (4 mg total) by mouth every 8 (eight) hours as needed for vomiting or nausea. Roselyn Carlin NOVAK, MD  Active    Patient not taking:   Discontinued 10/04/23 1536 (Completed Course)   Probiotic Product (PROBIOTIC-10 PO) [id number]  Take by mouth. [provider]  Active   sitaGLIPtin  (JANUVIA ) 100 MG tablet [id number]  Take 1 tablet (100 mg total) by mouth daily. Almarie Waddell NOVAK, NP  Active     Discontinued 10/04/23 1536 (Patient Preference)  Assessment/Plan:   Diabetes: Currently uncontrolled based on A1c and Continuous Glucose Monitor report but blood glucose has improved over the last 7 days since Tresiba  and short acting insulin  doses were increased - Reviewed goal A1c, goal fasting, and goal 2 hour post prandial glucose - Recommend to Increase Tresiba  to 92 units twice a day - Continue insulin  lyumgev 15 units with meals.  - Continue metformin  2500mg  / day and Januvia  100mg  daily  - would like to retry SGLT2 but will wait until blood glucose is consistently in lower range. Will try Farxiga 10mg  daily.   - Connected with patient in [facility]. Reviewed blood glucose report. Continue to use Continuous Glucose Monitor to check blood glucose several times a day.   Weight / Appetite Control:  Discussed some other alternative that could help with weight / appetite - Contrave (bupropion + naltrexone) or topiramate. Will send patient information in My Chart to review and discuss with PCP is interested.   B12 deficiency:  - Discussed foods that are rich in B12 and will forward list to MyChart - Continue to take Women's One A day and B Complex  - Recheck B12 in 2 to 3 months (Metformin  can lower B12 absorption, so  recommended checking at least yearly while taking metformin )  Follow Up Plan: 2 weeks  Madelin Ray, PharmD Clinical Pharmacist [facility]

## 2023-10-06 ENCOUNTER — Other Ambulatory Visit: Payer: Self-pay

## 2023-10-06 ENCOUNTER — Encounter: Payer: Self-pay | Admitting: Family Medicine

## 2023-10-06 ENCOUNTER — Other Ambulatory Visit (HOSPITAL_COMMUNITY): Payer: Self-pay

## 2023-10-06 DIAGNOSIS — M25562 Pain in left knee: Secondary | ICD-10-CM

## 2023-10-06 MED FILL — Duloxetine HCl Enteric Coated Pellets Cap 60 MG (Base Eq): ORAL | 30 days supply | Qty: 30 | Fill #2 | Status: CN

## 2023-10-07 ENCOUNTER — Other Ambulatory Visit (HOSPITAL_COMMUNITY): Payer: Self-pay

## 2023-10-07 ENCOUNTER — Other Ambulatory Visit (HOSPITAL_BASED_OUTPATIENT_CLINIC_OR_DEPARTMENT_OTHER): Payer: Self-pay

## 2023-10-07 ENCOUNTER — Encounter: Payer: Self-pay | Admitting: Pharmacist

## 2023-10-07 ENCOUNTER — Other Ambulatory Visit: Payer: Self-pay

## 2023-10-07 MED FILL — Duloxetine HCl Enteric Coated Pellets Cap 60 MG (Base Eq): ORAL | 30 days supply | Qty: 30 | Fill #2 | Status: AC

## 2023-10-10 ENCOUNTER — Ambulatory Visit (HOSPITAL_BASED_OUTPATIENT_CLINIC_OR_DEPARTMENT_OTHER)
Admission: RE | Admit: 2023-10-10 | Discharge: 2023-10-10 | Disposition: A | Discharge status: Home / Self Care | Source: Ambulatory Visit | Attending: Family Medicine | Admitting: Family Medicine

## 2023-10-10 DIAGNOSIS — M25562 Pain in left knee: Secondary | ICD-10-CM | POA: Insufficient documentation

## 2023-10-10 DIAGNOSIS — M25462 Effusion, left knee: Secondary | ICD-10-CM | POA: Diagnosis not present

## 2023-10-10 DIAGNOSIS — S83242A Other tear of medial meniscus, current injury, left knee, initial encounter: Secondary | ICD-10-CM | POA: Diagnosis not present

## 2023-10-10 DIAGNOSIS — M2242 Chondromalacia patellae, left knee: Secondary | ICD-10-CM | POA: Diagnosis not present

## 2023-10-11 ENCOUNTER — Other Ambulatory Visit: Payer: Self-pay

## 2023-10-18 ENCOUNTER — Other Ambulatory Visit (HOSPITAL_COMMUNITY): Payer: Self-pay

## 2023-10-18 ENCOUNTER — Other Ambulatory Visit (INDEPENDENT_AMBULATORY_CARE_PROVIDER_SITE_OTHER): Admitting: Pharmacist

## 2023-10-18 DIAGNOSIS — E1142 Type 2 diabetes mellitus with diabetic polyneuropathy: Secondary | ICD-10-CM

## 2023-10-18 DIAGNOSIS — Z794 Long term (current) use of insulin: Secondary | ICD-10-CM

## 2023-10-18 MED ORDER — TRESIBA FLEXTOUCH 200 UNIT/ML ~~LOC~~ SOPN
PEN_INJECTOR | SUBCUTANEOUS | 1 refills | Status: DC
  Filled 2023-10-18 – 2023-10-21 (×2): qty 30, fill #0
  Filled 2023-10-24: qty 30, 30d supply, fill #0
  Filled 2023-11-17: qty 30, 27d supply, fill #0

## 2023-10-18 MED ORDER — LYUMJEV KWIKPEN 100 UNIT/ML ~~LOC~~ SOPN
18.0000 [IU] | PEN_INJECTOR | Freq: Three times a day (TID) | SUBCUTANEOUS | 0 refills | Status: DC
  Filled 2023-10-18 – 2023-10-29 (×5): qty 15, 20d supply, fill #0

## 2023-10-18 NOTE — Progress Notes (Signed)
 10/18/2023 Name: Kelly Gallagher MRN: [id number] DOB: 10-02-1970  Chief Complaint  Patient presents with   Diabetes    Kelly Gallagher is a 53 y.o. year old female who presented for a telephone visit.   They were referred to the pharmacist by their PCP for assistance in managing diabetes.    Subjective:   Care Team: Primary Care Provider: Almarie Waddell NOVAK, NP ; Next Scheduled Visit: not currently scheduled.   Medication Access/Adherence  Current Pharmacy:  [facility] - [facility] [address] [place] [address] Phone: [phone] Fax: [phone]  [facility] [place] - [facility] [address] [place] [address] Phone: [phone] Fax: [phone]  [facility] [phone] - [place], [place] - [address] AT [facility] [address] [place] [address] Phone: [phone] Fax: [phone]   Patient reports affordability concerns with their medications: No  - Patient is currently enrolled in Diabetes Management Program for [facility] [employer] [employer] Employees.  Patient reports access/transportation concerns to their pharmacy: No    Diabetes:  Current medications:  Tresiba  92 units twice a day (dose increased from 90 bid last week) Insulin  lyumjev  - 15 units with each meal 3 times a day Metformin  1000mg  twice a day Metformin  ER 500mg  once a day Januvia  100mg  once a day  Medications tried in the past:  Mounjaro  and Ozempic  - caused severe nausea and diarrhea - seen at hospital for dehydration. She has been off GLP 1 type agent since around April 2025 and has gained about 20lbs back.  Jardiance  25mg  - took around 12/2021 but stopped due to frequent yeast infections.   Current glucose readings: see Continuous Glucose Monitor report below Using Libre 3+ sensors  Date of Download: 10/18/2023 % Time CGM is active: 92% Average Glucose: 306 mg/dL (previously was 683) Glucose  Management Indicator: 10.6% (previously was 10.9%) Glucose Variability: 16.5% (Previously was 18.3%) (goal <36%) Time in Goal:  - Time in range 70-180: 0% (previously was 1%) - Time above range: 100% (previously was 99%) - Time below range: 0% Observed patterns: Blood glucose has improved over the last 7 days since insulin  dose has been increased but still very high.        Diet: patient has been trying very hard to limit portion sizes and carbs. Since she had to stop GLP1 agents it has been harder to tune out the food noise and ignore cravings.  She has been having protein drink each morning the last few days.    Current medication access support: none   Objective:  Lab Results  Component Value Date   HGBA1C 11.9 (H) 09/30/2023    Lab Results  Component Value Date   CREATININE 0.73 09/30/2023   BUN 11 09/30/2023   NA 137 09/30/2023   K 4.4 09/30/2023   CL 97 09/30/2023   CO2 34 (H) 09/30/2023    Lab Results  Component Value Date   CHOL 119 09/30/2023   HDL 43.20 09/30/2023   LDLCALC 24 09/30/2023   TRIG 259.0 (H) 09/30/2023   CHOLHDL 3 09/30/2023    Medications Reviewed Today     Reviewed by Carla Milling, RPH-CPP (Pharmacist) on 10/18/23 at 1518  Med List Status: <None>   Medication Order Taking? Sig Documenting Provider Last Dose Status Informant  atorvastatin  (LIPITOR) 20 MG tablet [id number]  Take 1 tablet (20 mg total) by mouth daily. Almarie Waddell NOVAK,  NP  Active   B Complex Vitamins (B COMPLEX PO) [id number]  Take 1 capsule by mouth daily. [provider]  Active   Cholecalciferol (VITAMIN D3) 10 MCG (400 UNIT) tablet [id number]  daily. [provider]  Active   Continuous Glucose Sensor (FREESTYLE LIBRE 3 PLUS SENSOR) MISC [id number]  Change sensor every 15 days. Almarie Waddell NOVAK, NP  Active   DULoxetine  (CYMBALTA ) 60 MG capsule [id number]  Take 1 capsule (60 mg total) by mouth daily. Almarie Waddell NOVAK, NP  Active   fexofenadine  (ALLEGRA   ALLERGY) 180 MG tablet [id number]  Take 1 tablet (180 mg total) by mouth daily for 15 days. Teddy Sharper, FNP  Expired 09/05/23 2359   hydrOXYzine  (ATARAX ) 10 MG tablet [id number]  Take 1 tablet (10 mg total) by mouth 3 (three) times daily as needed. Almarie Waddell NOVAK, NP  Active   insulin  degludec (TRESIBA  FLEXTOUCH) 200 UNIT/ML FlexTouch Pen [id number] Yes Inject 90 Units into the skin in the morning and at bedtime.  Patient taking differently: Inject 92 Units into the skin in the morning and at bedtime.   Almarie Waddell NOVAK, NP  Active   Insulin  Lispro-aabc (LYUMJEV  KWIKPEN) 100 UNIT/ML KwikPen [id number] Yes Inject 15 Units into the skin with breakfast, with lunch, and with evening meal. Almarie Waddell NOVAK, NP  Active   Insulin  Pen Needle 32G X 4 MM MISC [id number]  Used to inject insulin  twice dailys Marylu Gee, DO  Active   Insulin  Syringe-Needle U-100 31G X 15/64 1 ML MISC [id number]  Use to inject insulin  twice daily Masters, Iz[place], DO  Active   Insulin  Syringe-Needle U-100 31G X 15/64 1 ML MISC [id number]  Use to inject insulin  with meals Almarie Waddell NOVAK, NP  Active   metFORMIN  (GLUCOPHAGE ) 1000 MG tablet [id number]  Take 1 tablet (1,000 mg total) by mouth 2 (two) times daily with a meal. Almarie Waddell NOVAK, NP  Active   metFORMIN  (GLUCOPHAGE -XR) 500 MG 24 hr tablet [id number]  Take 1 tablet (500 mg total) by mouth daily with breakfast. Almarie Waddell NOVAK, NP  Active   metoprolol  succinate (TOPROL -XL) 100 MG 24 hr tablet [id number]  Take 1 tablet (100 mg total) by mouth daily. WITH OR IMMEDIATELY FOLLOWING A MEAL [place], [place], DO  Active   Multiple Vitamins-Calcium  (ONE-A-DAY WOMENS FORMULA PO) [id number]  Take 1 tablet by mouth daily. [provider]  Active   olmesartan -hydrochlorothiazide  (BENICAR  HCT) 20-12.5 MG tablet [id number]  Take 1 tablet by mouth daily. Marylu Gee, DO  Active   Omega-3 Fatty Acids  (FISH OIL ) 1000 MG CAPS [id number]  Take 1 capsule (1,000 mg total) by mouth daily. Sherlean Failing, MD  Active   omeprazole  (PRILOSEC) 20 MG capsule [id number]  Take 1 capsule (20 mg total) by mouth daily. Almarie Waddell NOVAK, NP  Active   ondansetron  (ZOFRAN -ODT) 4 MG disintegrating tablet [id number]  Dissolve 1 tablet (4 mg total) by mouth every 8 (eight) hours as needed for vomiting or nausea. Roselyn Carlin NOVAK, MD  Active   Probiotic Product (PROBIOTIC-10 PO) [id number]  Take by mouth. [provider]  Active   sitaGLIPtin  (JANUVIA ) 100 MG tablet [id number]  Take 1 tablet (100 mg total) by mouth daily. Almarie Waddell NOVAK, NP  Active               Assessment/Plan:   Diabetes: Currently uncontrolled based on A1c and Continuous Glucose Monitor report but blood glucose has improved over the last 14 days since Tresiba  and short acting  insulin  doses were increased - Reviewed goal A1c, goal fasting, and goal 2 hour post prandial glucose - Recommend to Increase Tresiba  to 95 units twice a day - continue to increase by 3 units twice a day every 3 days until FBG in the morning is < 180. - Increase insulin  lyumgev 18 units with meals.  - Continue metformin  2500mg  / day and Januvia  100mg  daily  - would like to retry SGLT2 but will wait until blood glucose is consistently in lower range (200's instead fo 300's). Will try Farxiga 10mg  daily.   - Reviewed blood glucose report. Continue to use Continuous Glucose Monitor to check blood glucose several times a day.   Follow Up Plan: 1 week  Madelin Ray, PharmD Clinical Pharmacist [facility]

## 2023-10-19 NOTE — Telephone Encounter (Signed)
 Requested patient come in to the office for appointment to reexamine given this flared back up - will go over MRI results then too.

## 2023-10-20 ENCOUNTER — Ambulatory Visit (INDEPENDENT_AMBULATORY_CARE_PROVIDER_SITE_OTHER): Admitting: Family Medicine

## 2023-10-20 VITALS — BP 133/62 | Ht 65.0 in | Wt 245.0 lb

## 2023-10-20 DIAGNOSIS — M25562 Pain in left knee: Secondary | ICD-10-CM

## 2023-10-20 NOTE — Patient Instructions (Signed)
 Dr[facility] [address]. [place] [phone]  They will call you to schedule an appt.

## 2023-10-21 ENCOUNTER — Other Ambulatory Visit (HOSPITAL_COMMUNITY): Payer: Self-pay

## 2023-10-22 NOTE — Progress Notes (Signed)
 MRI reviewed and discussed with patient.  She has continued to have pain in her knee more localized now to medial joint line.  On MRI she has a medial meniscus tear.  Lateral pain has resolved and on exam today good strength with knee flexion, able to palpate intact biceps femoris tendon.  Given lack of improvement with conservative treatment over 2 months will refer to ortho to discuss arthroscopy.

## 2023-10-24 ENCOUNTER — Other Ambulatory Visit: Payer: Self-pay | Admitting: Family Medicine

## 2023-10-24 DIAGNOSIS — K219 Gastro-esophageal reflux disease without esophagitis: Secondary | ICD-10-CM

## 2023-10-25 ENCOUNTER — Other Ambulatory Visit (HOSPITAL_COMMUNITY): Payer: Self-pay

## 2023-10-25 ENCOUNTER — Telehealth

## 2023-10-25 ENCOUNTER — Other Ambulatory Visit: Payer: Self-pay | Admitting: Family Medicine

## 2023-10-25 ENCOUNTER — Other Ambulatory Visit: Payer: Self-pay

## 2023-10-25 DIAGNOSIS — F411 Generalized anxiety disorder: Secondary | ICD-10-CM

## 2023-10-25 MED ORDER — ATORVASTATIN CALCIUM 20 MG PO TABS
20.0000 mg | ORAL_TABLET | Freq: Every day | ORAL | 0 refills | Status: DC
  Filled 2023-10-25: qty 90, 90d supply, fill #0

## 2023-10-25 MED ORDER — OMEPRAZOLE 20 MG PO CPDR
20.0000 mg | DELAYED_RELEASE_CAPSULE | Freq: Every day | ORAL | 1 refills | Status: DC
  Filled 2023-10-25: qty 90, 90d supply, fill #0
  Filled 2024-01-27: qty 90, 90d supply, fill #1

## 2023-10-25 MED ORDER — HYDROXYZINE HCL 10 MG PO TABS
10.0000 mg | ORAL_TABLET | Freq: Three times a day (TID) | ORAL | 0 refills | Status: DC | PRN
  Filled 2023-10-25: qty 270, 90d supply, fill #0

## 2023-10-26 ENCOUNTER — Other Ambulatory Visit (HOSPITAL_COMMUNITY): Payer: Self-pay

## 2023-10-29 ENCOUNTER — Other Ambulatory Visit (HOSPITAL_COMMUNITY): Payer: Self-pay

## 2023-10-29 ENCOUNTER — Other Ambulatory Visit: Payer: Self-pay

## 2023-11-01 ENCOUNTER — Other Ambulatory Visit: Payer: Self-pay

## 2023-11-04 ENCOUNTER — Other Ambulatory Visit: Payer: Self-pay

## 2023-11-06 DIAGNOSIS — M1712 Unilateral primary osteoarthritis, left knee: Secondary | ICD-10-CM | POA: Diagnosis not present

## 2023-11-17 ENCOUNTER — Other Ambulatory Visit: Payer: Self-pay | Admitting: Family Medicine

## 2023-11-17 ENCOUNTER — Other Ambulatory Visit: Payer: Self-pay

## 2023-11-17 ENCOUNTER — Other Ambulatory Visit (HOSPITAL_COMMUNITY): Payer: Self-pay

## 2023-11-17 DIAGNOSIS — Z794 Long term (current) use of insulin: Secondary | ICD-10-CM

## 2023-11-17 MED FILL — Duloxetine HCl Enteric Coated Pellets Cap 60 MG (Base Eq): ORAL | 30 days supply | Qty: 30 | Fill #3 | Status: AC

## 2023-11-18 ENCOUNTER — Other Ambulatory Visit (HOSPITAL_COMMUNITY): Payer: Self-pay

## 2023-11-19 ENCOUNTER — Other Ambulatory Visit: Payer: Self-pay | Admitting: Pharmacist

## 2023-11-19 ENCOUNTER — Other Ambulatory Visit (HOSPITAL_COMMUNITY): Payer: Self-pay

## 2023-11-19 DIAGNOSIS — Z794 Long term (current) use of insulin: Secondary | ICD-10-CM

## 2023-11-19 MED ORDER — INSULIN SYRINGE-NEEDLE U-100 31G X 15/64" 1 ML MISC
1 refills | Status: DC
  Filled 2023-11-19: qty 400, 80d supply, fill #0
  Filled 2023-12-02: qty 400, 90d supply, fill #0

## 2023-11-19 NOTE — Telephone Encounter (Signed)
 Last note states 15 units, is 25 okay?

## 2023-11-21 ENCOUNTER — Other Ambulatory Visit: Payer: Self-pay

## 2023-11-23 ENCOUNTER — Other Ambulatory Visit (HOSPITAL_COMMUNITY): Payer: Self-pay

## 2023-11-23 ENCOUNTER — Other Ambulatory Visit: Payer: Self-pay | Admitting: Family Medicine

## 2023-11-23 ENCOUNTER — Other Ambulatory Visit: Payer: Self-pay | Admitting: Pharmacist

## 2023-11-23 ENCOUNTER — Other Ambulatory Visit (HOSPITAL_BASED_OUTPATIENT_CLINIC_OR_DEPARTMENT_OTHER): Payer: Self-pay

## 2023-11-23 ENCOUNTER — Other Ambulatory Visit: Payer: Self-pay

## 2023-11-23 DIAGNOSIS — H04123 Dry eye syndrome of bilateral lacrimal glands: Secondary | ICD-10-CM | POA: Diagnosis not present

## 2023-11-23 DIAGNOSIS — E119 Type 2 diabetes mellitus without complications: Secondary | ICD-10-CM | POA: Diagnosis not present

## 2023-11-23 DIAGNOSIS — H2513 Age-related nuclear cataract, bilateral: Secondary | ICD-10-CM | POA: Diagnosis not present

## 2023-11-23 DIAGNOSIS — E1142 Type 2 diabetes mellitus with diabetic polyneuropathy: Secondary | ICD-10-CM

## 2023-11-23 LAB — HM DIABETES EYE EXAM

## 2023-11-23 MED ORDER — LYUMJEV KWIKPEN 100 UNIT/ML ~~LOC~~ SOPN
18.0000 [IU] | PEN_INJECTOR | Freq: Three times a day (TID) | SUBCUTANEOUS | 1 refills | Status: DC
  Filled 2023-11-23 (×2): qty 15, 20d supply, fill #0

## 2023-11-24 ENCOUNTER — Other Ambulatory Visit (HOSPITAL_COMMUNITY): Payer: Self-pay

## 2023-11-24 ENCOUNTER — Other Ambulatory Visit: Payer: Self-pay

## 2023-11-24 ENCOUNTER — Other Ambulatory Visit: Payer: Self-pay | Admitting: Orthopedic Surgery

## 2023-11-24 ENCOUNTER — Encounter: Payer: Self-pay | Admitting: Pharmacist

## 2023-11-24 ENCOUNTER — Other Ambulatory Visit (INDEPENDENT_AMBULATORY_CARE_PROVIDER_SITE_OTHER): Admitting: Pharmacist

## 2023-11-24 DIAGNOSIS — Z794 Long term (current) use of insulin: Secondary | ICD-10-CM

## 2023-11-24 DIAGNOSIS — E1142 Type 2 diabetes mellitus with diabetic polyneuropathy: Secondary | ICD-10-CM

## 2023-11-24 MED ORDER — LYUMJEV KWIKPEN 100 UNIT/ML ~~LOC~~ SOPN
28.0000 [IU] | PEN_INJECTOR | Freq: Three times a day (TID) | SUBCUTANEOUS | 0 refills | Status: DC
  Filled 2023-11-24 – 2023-12-01 (×2): qty 90, 85d supply, fill #0
  Filled 2023-12-09 – 2023-12-13 (×3): qty 90, 86d supply, fill #0

## 2023-11-24 MED ORDER — TRESIBA FLEXTOUCH 200 UNIT/ML ~~LOC~~ SOPN
120.0000 [IU] | PEN_INJECTOR | Freq: Two times a day (BID) | SUBCUTANEOUS | 0 refills | Status: DC
  Filled 2023-11-24: qty 36, 30d supply, fill #0
  Filled 2023-12-01: qty 108, 90d supply, fill #0
  Filled 2023-12-02: qty 36, 30d supply, fill #0
  Filled 2023-12-02 – 2023-12-06 (×2): qty 108, 90d supply, fill #0

## 2023-11-24 NOTE — Progress Notes (Signed)
 11/24/2023 Name: Kelly Gallagher MRN: [id number] DOB: Jun 21, 1970  Chief Complaint  Patient presents with   Diabetes    Kelly Gallagher is a 53 y.o. year old female who presented for a telephone visit.   They were referred to the pharmacist by their PCP for assistance in managing diabetes.    Subjective:   Care Team: Primary Care Provider: Almarie Waddell NOVAK, NP ; Next Scheduled Visit: not currently scheduled.  Patient has knee surgery scheduled for 12/06/2023  Medication Access/Adherence  Current Pharmacy:  [facility] - [facility] [address] [place] [address] Phone: [phone] Fax: [phone]  [facility] [place] - [facility] [address], [address] [place] [address] Phone: [phone] Fax: [phone]  [facility] [phone] - [place], [place] - [address] AT [facility] [address] [place] [address] Phone: [phone] Fax: [phone]   Patient reports affordability concerns with their medications: No  - Patient is currently enrolled in Diabetes Management Program for [employer] [facility] [employer].  Patient reports access/transportation concerns to their pharmacy: No    Diabetes:  Current medications:  Tresiba  U200 - inject 110 units twice a day Insulin  lyumjev  - 25 units with each meal 3 times a day Metformin  1000mg  twice a day Metformin  ER 500mg  once a day Januvia  100mg  once a day  Medications tried in the past:  Mounjaro  and Ozempic  - caused severe nausea and diarrhea (on higher doses) - seen at hospital for dehydration. She has been off GLP 1 type agent since around April 2025 and has gained about 20lbs back.  Jardiance  25mg  - took around 12/2021 but stopped due to frequent yeast infections.   Current glucose readings: see Continuous Glucose Monitor report below Using Libre 3+ sensors  Date of Download:11/24/2023 % Time CGM is active: 92% Average Glucose: 306  mg/dL (previously was 683) Glucose Management Indicator: 10.6% (previously was 10.9%) Glucose Variability: 16.5% (Previously was 18.3%) (goal <36%) Time in Goal:  - Time in range 70-180: 0% (previously was 1%) - Time above range: 100% (previously was 99%) - Time below range: 0% Observed patterns: Blood glucose has improved over the last 7 days since insulin  dose has been increased but still very high.        Diet: patient has been trying very hard to limit portion sizes and carbs.    Current medication access support: uses manufacturer coupon with Tresiba  at pharmacy   Objective:  Lab Results  Component Value Date   HGBA1C 11.9 (H) 09/30/2023    Lab Results  Component Value Date   CREATININE 0.73 09/30/2023   BUN 11 09/30/2023   NA 137 09/30/2023   K 4.4 09/30/2023   CL 97 09/30/2023   CO2 34 (H) 09/30/2023    Lab Results  Component Value Date   CHOL 119 09/30/2023   HDL 43.20 09/30/2023   LDLCALC 24 09/30/2023   TRIG 259.0 (H) 09/30/2023   CHOLHDL 3 09/30/2023    Medications Reviewed Today     Reviewed by Carla Milling, RPH-CPP (Pharmacist) on 11/24/23 at 1627  Med List Status: <None>   Medication Order Taking? Sig Documenting Provider Last Dose Status Informant  atorvastatin  (LIPITOR) 20 MG tablet [id number]  Take 1 tablet (20 mg total) by mouth daily. Almarie Waddell NOVAK, NP  Active   B Complex Vitamins (B COMPLEX PO) [id number]  Take 1 capsule by mouth daily. [provider]  Active   Cholecalciferol (VITAMIN D3) 10 MCG (400 UNIT) tablet [id number]  daily. [provider]  Active   Continuous Glucose Sensor (FREESTYLE LIBRE 3 PLUS SENSOR) MISC [id number]  Change sensor every 15 days. Almarie Waddell NOVAK, NP  Active   DULoxetine  (CYMBALTA ) 60 MG capsule [id number]  Take 1 capsule (60 mg total) by mouth daily. Almarie Waddell NOVAK, NP  Active   fexofenadine  (ALLEGRA  ALLERGY) 180 MG tablet [id number]  Take 1 tablet (180 mg total) by mouth daily for 15 days. Teddy Sharper, FNP  Expired 09/05/23 2359   hydrOXYzine  (ATARAX ) 10 MG tablet [id number]  Take 1 tablet (10 mg total) by mouth 3 (three) times daily as needed. Almarie Waddell NOVAK, NP  Active   insulin  degludec (TRESIBA  FLEXTOUCH) 200 UNIT/ML FlexTouch Pen [id number]  Inject up to 110 units twice a day. Start with 95 units twice a day and increase by 3 units every 3 days until blood glucose is < 180. Domenica Harlene LABOR, MD  Active   Insulin  Lispro-aabc (LYUMJEV  [facility]) 100 UNIT/ML KwikPen [id number] Yes Inject 18-25 Units into the skin with breakfast, with lunch, and with evening meal.  Patient taking differently: Inject 25 Units into the skin with breakfast, with lunch, and with evening meal.   Domenica Harlene LABOR, MD  Active   Insulin  Syringe-Needle U-100 31G X 15/64 1 ML MISC [id number]  Use with insulin  pens (both long acting and short acting) to inject insulin  up to 5 times per day. Domenica Harlene LABOR, MD  Active   metFORMIN  (GLUCOPHAGE ) 1000 MG tablet [id number]  Take 1 tablet (1,000 mg total) by mouth 2 (two) times daily with a meal. Almarie Waddell NOVAK, NP  Active   metFORMIN  (GLUCOPHAGE -XR) 500 MG 24 hr tablet [id number]  Take 1 tablet (500 mg total) by mouth daily with breakfast. Almarie Waddell NOVAK, NP  Active   metoprolol  succinate (TOPROL -XL) 100 MG 24 hr tablet [id number]  Take 1 tablet (100 mg total) by mouth daily. WITH OR IMMEDIATELY FOLLOWING A MEAL [place], [place], DO  Active   Multiple Vitamins-Calcium  (ONE-A-DAY WOMENS FORMULA PO) [id number]  Take 1 tablet by mouth daily. [provider]  Active   olmesartan -hydrochlorothiazide  (BENICAR  HCT) 20-12.5 MG tablet [id number]  Take 1 tablet by mouth daily. Marylu Gee, DO  Active   Omega-3 Fatty Acids  (FISH OIL ) 1000 MG CAPS [id number]  Take 1 capsule (1,000 mg total) by mouth daily. Sherlean Failing, MD  Active   omeprazole  (PRILOSEC) 20 MG capsule [id number]  Take 1 capsule (20 mg total) by mouth daily. Almarie Waddell NOVAK, NP  Active   ondansetron  (ZOFRAN -ODT) 4 MG  disintegrating tablet [id number]  Dissolve 1 tablet (4 mg total) by mouth every 8 (eight) hours as needed for vomiting or nausea. Roselyn Carlin NOVAK, MD  Active   Probiotic Product (PROBIOTIC-10 PO) [id number]  Take by mouth. [provider]  Active   sitaGLIPtin  (JANUVIA ) 100 MG tablet [id number]  Take 1 tablet (100 mg total) by mouth daily. Almarie Waddell NOVAK, NP  Active               Assessment/Plan:   Diabetes: Currently uncontrolled based on A1c but per Continuous Glucose Monitor report GMI and blood glucose has improved over the last 14 days (GMI down to 9.6%) - Recommend to Increase Tresiba  U200 to 112 units twice a day - continue to increase by 2 units twice a day every 3 days until FBG in the morning is < 150. Max of 120 units  twice a day - Increase insulin  lyumgev to 28 units with meals. (Add additional lyumgev - add 1 extra unit to base amount for each 50 points blood glucose is > 120) - Continue metformin  2500mg  / day and Januvia  100mg  daily  - would like to retry SGLT2 but will wait until blood glucose is consistently in lower range. Will try Farxiga 10mg  daily.  Also discuss possible retrying GLP1 at a very low dose - like 0.25mg  of less of Ozempic  but will wait until after her knee surgery 12/06/2023 - Reviewed blood glucose report. Continue to use Continuous Glucose Monitor to check blood glucose several times a day.   Follow Up Plan: 2 to 3 weeks.   Kelly Gallagher, PharmD Clinical Pharmacist [facility]

## 2023-11-25 ENCOUNTER — Other Ambulatory Visit (HOSPITAL_COMMUNITY): Payer: Self-pay

## 2023-11-29 ENCOUNTER — Other Ambulatory Visit: Payer: Self-pay

## 2023-11-29 ENCOUNTER — Encounter (HOSPITAL_BASED_OUTPATIENT_CLINIC_OR_DEPARTMENT_OTHER): Payer: Self-pay | Admitting: Orthopedic Surgery

## 2023-11-30 ENCOUNTER — Encounter (HOSPITAL_BASED_OUTPATIENT_CLINIC_OR_DEPARTMENT_OTHER)
Admission: RE | Admit: 2023-11-30 | Discharge: 2023-11-30 | Disposition: A | Discharge status: Home / Self Care | Source: Ambulatory Visit | Attending: Orthopedic Surgery | Admitting: Orthopedic Surgery

## 2023-11-30 DIAGNOSIS — Z01818 Encounter for other preprocedural examination: Secondary | ICD-10-CM | POA: Diagnosis not present

## 2023-11-30 DIAGNOSIS — Z01812 Encounter for preprocedural laboratory examination: Secondary | ICD-10-CM | POA: Diagnosis present

## 2023-11-30 DIAGNOSIS — Z01810 Encounter for preprocedural cardiovascular examination: Secondary | ICD-10-CM | POA: Diagnosis present

## 2023-11-30 LAB — BASIC METABOLIC PANEL WITH GFR
Anion gap: 13 (ref 5–15)
BUN: 14 mg/dL (ref 6–20)
CO2: 23 mmol/L (ref 22–32)
Calcium: 9.7 mg/dL (ref 8.9–10.3)
Chloride: 97 mmol/L — ABNORMAL LOW (ref 98–111)
Creatinine, Ser: 0.75 mg/dL (ref 0.44–1.00)
GFR, Estimated: >60 mL/min (ref >60)
Glucose, Bld: 329 mg/dL — ABNORMAL HIGH (ref 70–99)
Potassium: 4.6 mmol/L (ref 3.5–5.1)
Sodium: 133 mmol/L — ABNORMAL LOW (ref 135–145)

## 2023-11-30 NOTE — Progress Notes (Signed)
? ? ? ? ? ?  Enhanced Recovery after Surgery is a protocol used to improve the stress on your body and your recovery after surgery. ? ?Patient Instructions ? ?The night before surgery:  ?No food after midnight. ONLY clear liquids after midnight ? ?The day of surgery (if you do NOT have diabetes):  ?Drink ONE (1) Pre-Surgery Clear Ensure as directed.   ?This drink was given to you during your hospital  ?pre-op appointment visit. ?The pre-op nurse will instruct you on the time to drink the  ?Pre-Surgery Ensure depending on your surgery time. ?Finish the drink at the designated time by the pre-op nurse.  ?Nothing else to drink after completing the  ?Pre-Surgery Clear Ensure. ? ?The day of surgery (if you have diabetes): ?Drink ONE (1) Gatorade 2 (G2) as directed. ?This drink was given to you during your hospital  ?pre-op appointment visit.  ?The pre-op nurse will instruct you on the time to drink the  ? Gatorade 2 (G2) depending on your surgery time. ?Color of the Gatorade may vary. Red is not allowed. ?Nothing else to drink after completing the  ?Gatorade 2 (G2). ? ?       If you have questions, please contact your surgeon?s office. ?

## 2023-12-01 ENCOUNTER — Other Ambulatory Visit: Payer: Self-pay | Admitting: Student

## 2023-12-01 ENCOUNTER — Other Ambulatory Visit: Payer: Self-pay | Admitting: Family Medicine

## 2023-12-01 ENCOUNTER — Other Ambulatory Visit (HOSPITAL_COMMUNITY): Payer: Self-pay

## 2023-12-01 ENCOUNTER — Other Ambulatory Visit: Payer: Self-pay

## 2023-12-01 DIAGNOSIS — I10 Essential (primary) hypertension: Secondary | ICD-10-CM

## 2023-12-01 DIAGNOSIS — Z794 Long term (current) use of insulin: Secondary | ICD-10-CM

## 2023-12-01 NOTE — Progress Notes (Signed)
 Spoke with Dr Piedad re: glucose level of 329. OK to proceed with surgery as scheduled but wanted Dr Yvone to be aware. LM on VM of Meredith at Dr Yvone office. Will also notify patient of elevated level.

## 2023-12-02 ENCOUNTER — Other Ambulatory Visit (HOSPITAL_COMMUNITY): Payer: Self-pay

## 2023-12-02 ENCOUNTER — Encounter (HOSPITAL_COMMUNITY): Payer: Self-pay

## 2023-12-02 ENCOUNTER — Other Ambulatory Visit: Payer: Self-pay

## 2023-12-02 MED ORDER — METFORMIN HCL ER 500 MG PO TB24
500.0000 mg | ORAL_TABLET | Freq: Every day | ORAL | 0 refills | Status: DC
  Filled 2023-12-02 – 2024-01-27 (×2): qty 90, 90d supply, fill #0

## 2023-12-02 MED FILL — Metoprolol Succinate Tab ER 24HR 100 MG (Tartrate Equiv): ORAL | 90 days supply | Qty: 90 | Fill #0 | Status: CN

## 2023-12-02 MED FILL — Metoprolol Succinate Tab ER 24HR 100 MG (Tartrate Equiv): ORAL | 90 days supply | Qty: 90 | Fill #0 | Status: AC

## 2023-12-03 ENCOUNTER — Other Ambulatory Visit: Payer: Self-pay | Admitting: Orthopedic Surgery

## 2023-12-03 ENCOUNTER — Other Ambulatory Visit: Payer: Self-pay

## 2023-12-06 ENCOUNTER — Other Ambulatory Visit (HOSPITAL_COMMUNITY): Payer: Self-pay

## 2023-12-06 ENCOUNTER — Other Ambulatory Visit: Payer: Self-pay | Admitting: Student

## 2023-12-06 ENCOUNTER — Encounter (HOSPITAL_BASED_OUTPATIENT_CLINIC_OR_DEPARTMENT_OTHER)
Admission: RE | Disposition: A | Discharge status: Home / Self Care | Payer: Self-pay | Source: Home / Self Care | Attending: Orthopedic Surgery

## 2023-12-06 ENCOUNTER — Ambulatory Visit (HOSPITAL_BASED_OUTPATIENT_CLINIC_OR_DEPARTMENT_OTHER): Payer: Self-pay | Admitting: Anesthesiology

## 2023-12-06 ENCOUNTER — Encounter (HOSPITAL_BASED_OUTPATIENT_CLINIC_OR_DEPARTMENT_OTHER): Payer: Self-pay | Admitting: Orthopedic Surgery

## 2023-12-06 ENCOUNTER — Other Ambulatory Visit: Payer: Self-pay

## 2023-12-06 ENCOUNTER — Ambulatory Visit (HOSPITAL_BASED_OUTPATIENT_CLINIC_OR_DEPARTMENT_OTHER)
Admission: RE | Admit: 2023-12-06 | Discharge: 2023-12-06 | Disposition: A | Discharge status: Home / Self Care | Attending: Orthopedic Surgery | Admitting: Orthopedic Surgery

## 2023-12-06 DIAGNOSIS — Z8249 Family history of ischemic heart disease and other diseases of the circulatory system: Secondary | ICD-10-CM | POA: Diagnosis not present

## 2023-12-06 DIAGNOSIS — Z6841 Body Mass Index (BMI) 40.0 and over, adult: Secondary | ICD-10-CM | POA: Insufficient documentation

## 2023-12-06 DIAGNOSIS — Z01818 Encounter for other preprocedural examination: Secondary | ICD-10-CM

## 2023-12-06 DIAGNOSIS — Z7984 Long term (current) use of oral hypoglycemic drugs: Secondary | ICD-10-CM | POA: Insufficient documentation

## 2023-12-06 DIAGNOSIS — I10 Essential (primary) hypertension: Secondary | ICD-10-CM | POA: Diagnosis not present

## 2023-12-06 DIAGNOSIS — E119 Type 2 diabetes mellitus without complications: Secondary | ICD-10-CM | POA: Diagnosis not present

## 2023-12-06 DIAGNOSIS — X58XXXA Exposure to other specified factors, initial encounter: Secondary | ICD-10-CM | POA: Diagnosis not present

## 2023-12-06 DIAGNOSIS — M94262 Chondromalacia, left knee: Secondary | ICD-10-CM | POA: Insufficient documentation

## 2023-12-06 DIAGNOSIS — K219 Gastro-esophageal reflux disease without esophagitis: Secondary | ICD-10-CM | POA: Insufficient documentation

## 2023-12-06 DIAGNOSIS — M2342 Loose body in knee, left knee: Secondary | ICD-10-CM | POA: Insufficient documentation

## 2023-12-06 DIAGNOSIS — S83242A Other tear of medial meniscus, current injury, left knee, initial encounter: Secondary | ICD-10-CM

## 2023-12-06 DIAGNOSIS — E6689 Other obesity not elsewhere classified: Secondary | ICD-10-CM | POA: Diagnosis not present

## 2023-12-06 DIAGNOSIS — Z794 Long term (current) use of insulin: Secondary | ICD-10-CM | POA: Insufficient documentation

## 2023-12-06 DIAGNOSIS — F418 Other specified anxiety disorders: Secondary | ICD-10-CM

## 2023-12-06 DIAGNOSIS — Z79899 Other long term (current) drug therapy: Secondary | ICD-10-CM | POA: Insufficient documentation

## 2023-12-06 HISTORY — DX: Other specified postprocedural states: Z98.890

## 2023-12-06 HISTORY — DX: Nausea with vomiting, unspecified: R11.2

## 2023-12-06 HISTORY — PX: KNEE ARTHROSCOPY WITH MEDIAL MENISECTOMY: SHX5651

## 2023-12-06 LAB — GLUCOSE, CAPILLARY
Glucose-Capillary: 258 mg/dL — ABNORMAL HIGH (ref 70–99)
Glucose-Capillary: 276 mg/dL — ABNORMAL HIGH (ref 70–99)
Glucose-Capillary: 232 mg/dL — ABNORMAL HIGH (ref 70–99)
Glucose-Capillary: 251 mg/dL — ABNORMAL HIGH (ref 70–99)

## 2023-12-06 LAB — POCT PREGNANCY, URINE: Preg Test, Ur: NEGATIVE

## 2023-12-06 SURGERY — ARTHROSCOPY, KNEE, WITH MEDIAL MENISCECTOMY
Anesthesia: General | Site: Knee | Laterality: Left

## 2023-12-06 MED ORDER — ONDANSETRON HCL 4 MG/2ML IJ SOLN
INTRAMUSCULAR | Status: AC
  Filled 2023-12-06: qty 2

## 2023-12-06 MED ORDER — FENTANYL CITRATE (PF) 100 MCG/2ML IJ SOLN
INTRAMUSCULAR | Status: DC | PRN
  Administered 2023-12-06 (×2): 50 ug via INTRAVENOUS

## 2023-12-06 MED ORDER — DEXAMETHASONE SODIUM PHOSPHATE 10 MG/ML IJ SOLN
INTRAMUSCULAR | Status: DC | PRN
  Administered 2023-12-06: 10 mg via INTRAVENOUS

## 2023-12-06 MED ORDER — DEXAMETHASONE SODIUM PHOSPHATE 10 MG/ML IJ SOLN
INTRAMUSCULAR | Status: AC
  Filled 2023-12-06: qty 1

## 2023-12-06 MED ORDER — FENTANYL CITRATE (PF) 100 MCG/2ML IJ SOLN
INTRAMUSCULAR | Status: AC
  Filled 2023-12-06: qty 2

## 2023-12-06 MED ORDER — ACETAMINOPHEN 500 MG PO TABS
1000.0000 mg | ORAL_TABLET | Freq: Once | ORAL | Status: AC
  Administered 2023-12-06: 1000 mg via ORAL

## 2023-12-06 MED ORDER — MIDAZOLAM HCL 2 MG/2ML IJ SOLN
INTRAMUSCULAR | Status: AC
  Filled 2023-12-06: qty 2

## 2023-12-06 MED ORDER — HYDROCODONE-ACETAMINOPHEN 5-325 MG PO TABS
1.0000 | ORAL_TABLET | Freq: Four times a day (QID) | ORAL | 0 refills | Status: AC | PRN
  Filled 2023-12-06 (×2): qty 20, 5d supply, fill #0

## 2023-12-06 MED ORDER — DEXMEDETOMIDINE HCL IN NACL 80 MCG/20ML IV SOLN
INTRAVENOUS | Status: DC | PRN
Start: 2023-12-06 — End: 2023-12-06
  Administered 2023-12-06: 8 ug via INTRAVENOUS

## 2023-12-06 MED ORDER — MIDAZOLAM HCL 2 MG/2ML IJ SOLN: 0.5000 mg | Freq: Once | INTRAMUSCULAR | Status: DC | PRN

## 2023-12-06 MED ORDER — HYDROMORPHONE HCL 1 MG/ML IJ SOLN
INTRAMUSCULAR | Status: AC
  Filled 2023-12-06: qty 0.5

## 2023-12-06 MED ORDER — OXYCODONE HCL 5 MG/5ML PO SOLN: 5.0000 mg | Freq: Once | ORAL | Status: DC | PRN

## 2023-12-06 MED ORDER — SCOPOLAMINE 1 MG/3DAYS TD PT72
MEDICATED_PATCH | TRANSDERMAL | Status: AC
  Filled 2023-12-06: qty 1

## 2023-12-06 MED ORDER — MIDAZOLAM HCL 5 MG/5ML IJ SOLN
INTRAMUSCULAR | Status: DC | PRN
  Administered 2023-12-06: 2 mg via INTRAVENOUS

## 2023-12-06 MED ORDER — INSULIN ASPART 100 UNIT/ML IJ SOLN
10.0000 [IU] | Freq: Once | INTRAMUSCULAR | Status: AC
  Administered 2023-12-06: 10 [IU] via SUBCUTANEOUS

## 2023-12-06 MED ORDER — AMISULPRIDE (ANTIEMETIC) 5 MG/2ML IV SOLN: 10.0000 mg | Freq: Once | INTRAVENOUS | Status: DC

## 2023-12-06 MED ORDER — CEFAZOLIN SODIUM-DEXTROSE 2-4 GM/100ML-% IV SOLN
2.0000 g | INTRAVENOUS | Status: AC
  Administered 2023-12-06: 2 g via INTRAVENOUS

## 2023-12-06 MED ORDER — SCOPOLAMINE 1 MG/3DAYS TD PT72
1.0000 | MEDICATED_PATCH | TRANSDERMAL | Status: DC
  Administered 2023-12-06: 1 mg via TRANSDERMAL

## 2023-12-06 MED ORDER — MEPERIDINE HCL 25 MG/ML IJ SOLN: 6.2500 mg | INTRAMUSCULAR | Status: DC | PRN

## 2023-12-06 MED ORDER — ACETAMINOPHEN 500 MG PO TABS
ORAL_TABLET | ORAL | Status: AC
  Filled 2023-12-06: qty 2

## 2023-12-06 MED ORDER — HYDROMORPHONE HCL 1 MG/ML IJ SOLN
0.5000 mg | INTRAMUSCULAR | Status: DC | PRN
  Administered 2023-12-06 (×2): 0.5 mg via INTRAVENOUS

## 2023-12-06 MED ORDER — OXYCODONE HCL 5 MG PO TABS: 5.0000 mg | ORAL_TABLET | Freq: Once | ORAL | Status: DC | PRN

## 2023-12-06 MED ORDER — BUPIVACAINE HCL (PF) 0.5 % IJ SOLN
INTRAMUSCULAR | Status: DC | PRN
  Administered 2023-12-06: 20 mL

## 2023-12-06 MED ORDER — SODIUM CHLORIDE 0.9 % IR SOLN
Status: DC | PRN
  Administered 2023-12-06: 3000 mL

## 2023-12-06 MED ORDER — ONDANSETRON HCL 4 MG/2ML IJ SOLN
INTRAMUSCULAR | Status: DC | PRN
  Administered 2023-12-06: 4 mg via INTRAVENOUS

## 2023-12-06 MED ORDER — KETOROLAC TROMETHAMINE 30 MG/ML IJ SOLN
INTRAMUSCULAR | Status: AC
  Filled 2023-12-06: qty 1

## 2023-12-06 MED ORDER — LIDOCAINE 2% (20 MG/ML) 5 ML SYRINGE
INTRAMUSCULAR | Status: DC | PRN
  Administered 2023-12-06: 60 mg via INTRAVENOUS

## 2023-12-06 MED ORDER — KETOROLAC TROMETHAMINE 30 MG/ML IJ SOLN
30.0000 mg | Freq: Once | INTRAMUSCULAR | Status: AC
  Administered 2023-12-06: 30 mg via INTRAVENOUS

## 2023-12-06 MED ORDER — BUPIVACAINE HCL (PF) 0.5 % IJ SOLN
INTRAMUSCULAR | Status: AC
  Filled 2023-12-06: qty 30

## 2023-12-06 MED ORDER — LACTATED RINGERS IV SOLN: INTRAVENOUS | Status: DC

## 2023-12-06 MED ORDER — ATROPINE SULFATE 0.4 MG/ML IV SOLN
INTRAVENOUS | Status: AC
Start: 2023-12-06 — End: 2023-12-06
  Filled 2023-12-06: qty 1

## 2023-12-06 MED ORDER — PROPOFOL 10 MG/ML IV BOLUS
INTRAVENOUS | Status: DC | PRN
  Administered 2023-12-06: 70 mg via INTRAVENOUS
  Administered 2023-12-06: 200 mg via INTRAVENOUS

## 2023-12-06 MED ORDER — HYDROMORPHONE HCL 1 MG/ML IJ SOLN
0.2500 mg | INTRAMUSCULAR | Status: DC | PRN
  Administered 2023-12-06 (×4): 0.5 mg via INTRAVENOUS

## 2023-12-06 MED ORDER — CEFAZOLIN SODIUM-DEXTROSE 2-4 GM/100ML-% IV SOLN
INTRAVENOUS | Status: AC
Start: 2023-12-06 — End: 2023-12-06
  Filled 2023-12-06: qty 100

## 2023-12-06 MED ORDER — GLYCOPYRROLATE PF 0.2 MG/ML IJ SOSY
PREFILLED_SYRINGE | INTRAMUSCULAR | Status: AC
  Filled 2023-12-06: qty 2

## 2023-12-06 SURGICAL SUPPLY — 28 items
BLADE EXCALIBUR 4.0X13 (MISCELLANEOUS) ×1 IMPLANT
BNDG ELASTIC 6INX 5YD STR LF (GAUZE/BANDAGES/DRESSINGS) ×1 IMPLANT
DISSECTOR 4.0MM X 13CM (MISCELLANEOUS) IMPLANT
DRAPE ARTHROSCOPY W/POUCH 90 (DRAPES) ×1 IMPLANT
DRSG EMULSION OIL 3X3 NADH (GAUZE/BANDAGES/DRESSINGS) ×1 IMPLANT
DURAPREP 26ML APPLICATOR (WOUND CARE) ×1 IMPLANT
ELECTRODE REM PT RTRN 9FT ADLT (ELECTROSURGICAL) IMPLANT
GAUZE SPONGE 4X4 12PLY STRL (GAUZE/BANDAGES/DRESSINGS) ×1 IMPLANT
GLOVE BIOGEL PI IND STRL 8 (GLOVE) ×2 IMPLANT
GLOVE ECLIPSE 7.5 STRL STRAW (GLOVE) ×2 IMPLANT
GOWN STRL REUS W/ TWL LRG LVL3 (GOWN DISPOSABLE) ×1 IMPLANT
GOWN STRL REUS W/ TWL XL LVL3 (GOWN DISPOSABLE) ×1 IMPLANT
GOWN STRL REUS W/TWL XL LVL3 (GOWN DISPOSABLE) ×1 IMPLANT
MANIFOLD NEPTUNE II (INSTRUMENTS) IMPLANT
NDL SAFETY ECLIPSE 18X1.5 (NEEDLE) IMPLANT
PACK ARTHROSCOPY DSU (CUSTOM PROCEDURE TRAY) ×1 IMPLANT
PACK BASIN DAY SURGERY FS (CUSTOM PROCEDURE TRAY) ×1 IMPLANT
PAD CAST 4YDX4 CTTN HI CHSV (CAST SUPPLIES) ×1 IMPLANT
PENCIL SMOKE EVACUATOR (MISCELLANEOUS) IMPLANT
POSITIONER KNEE ARTHROSCOPIC (MISCELLANEOUS) ×1 IMPLANT
SOL .9 NS 3000ML IRR UROMATIC (IV SOLUTION) ×2 IMPLANT
SUT ETHILON 4 0 PS 2 18 (SUTURE) IMPLANT
SYR 5ML LL (SYRINGE) ×1 IMPLANT
TOWEL GREEN STERILE FF (TOWEL DISPOSABLE) ×1 IMPLANT
TUBING ARTHROSCOPY IRRIG 16FT (MISCELLANEOUS) ×1 IMPLANT
WAND ABLATOR APOLLO I90 (BUR) IMPLANT
WATER STERILE IRR 1000ML POUR (IV SOLUTION) ×1 IMPLANT
WRAP KNEE MAXI GEL POST OP (GAUZE/BANDAGES/DRESSINGS) ×1 IMPLANT

## 2023-12-06 NOTE — Anesthesia Preprocedure Evaluation (Addendum)
 Anesthesia Evaluation  Patient identified by MRN, date of birth, ID band Patient awake    Reviewed: Allergy & Precautions, NPO status , Patient's Chart, lab work & pertinent test results  History of Anesthesia Complications (+) PONV  Airway Mallampati: III  TM Distance: >3 FB Neck ROM: Full    Dental  (+) Dental Advisory Given, Missing   Pulmonary neg pulmonary ROS   breath sounds clear to auscultation       Cardiovascular hypertension, Pt. on medications and Pt. on home beta blockers (-) angina  Rhythm:Regular Rate:Normal     Neuro/Psych   Anxiety Depression    negative neurological ROS     GI/Hepatic Neg liver ROS,GERD  Medicated and Controlled,,  Endo/Other  diabetes (glu 258), Insulin  Dependent, Oral Hypoglycemic Agents  Class 4 obesityBMI 42  Renal/GU negative Renal ROS     Musculoskeletal   Abdominal   Peds  Hematology negative hematology ROS (+)   Anesthesia Other Findings   Reproductive/Obstetrics                              Anesthesia Physical Anesthesia Plan  ASA: 3  Anesthesia Plan: General   Post-op Pain Management: Tylenol  PO (pre-op)* and Minimal or no pain anticipated   Induction: Intravenous  PONV Risk Score and Plan: Ondansetron , Dexamethasone  and Scopolamine  patch - Pre-op  Airway Management Planned: LMA  Additional Equipment: None  Intra-op Plan:   Post-operative Plan:   Informed Consent: I have reviewed the patients History and Physical, chart, labs and discussed the procedure including the risks, benefits and alternatives for the proposed anesthesia with the patient or authorized representative who has indicated his/her understanding and acceptance.     Dental advisory given  Plan Discussed with: CRNA and Surgeon  Anesthesia Plan Comments:          Anesthesia Quick Evaluation

## 2023-12-06 NOTE — H&P (Signed)
 A pre op hand p   Chief Complaint: Left knee pain  HPI: Kelly Gallagher is a 53 y.o. female who presents for evaluation of left knee pain. It has been present for greater than 3 months and has been worsening. She has failed conservative measures. Pain is rated as moderate.  Past Medical History:  Diagnosis Date   Allergy 12/30/2020   seasonal   Anxiety 12/30/2020   Depression    Diabetes mellitus without complication (HCC)    GERD (gastroesophageal reflux disease)    Hypertension    PONV (postoperative nausea and vomiting)    Past Surgical History:  Procedure Laterality Date   CHOLECYSTECTOMY     COLONOSCOPY  01/13/2021   JMP   DILATION AND CURETTAGE OF UTERUS     KNEE ARTHROSCOPY W/ MENISCAL REPAIR Right 05/2010   Social History   Socioeconomic History   Marital status: Married    Spouse name: Not on file   Number of children: Not on file   Years of education: Not on file   Highest education level: Bachelor's degree (e.g., BA, AB, BS)  Occupational History   Not on file  Tobacco Use   Smoking status: Never   Smokeless tobacco: Never  Vaping Use   Vaping status: Never Used  Substance and Sexual Activity   Alcohol use: Never    Alcohol/week: 1.0 standard drink of alcohol    Types: 1 Glasses of wine per week   Drug use: Never   Sexual activity: Yes    Partners: Male    Birth control/protection: None  Other Topics Concern   Not on file  Social History Narrative   Not on file   Social Drivers of Health   Financial Resource Strain: Low Risk  (05/11/2023)   Overall Financial Resource Strain (CARDIA)    Difficulty of Paying Living Expenses: Not very hard  Food Insecurity: No Food Insecurity (05/11/2023)   Hunger Vital Sign    Worried About Running Out of Food in the Last Year: Never true    Ran Out of Food in the Last Year: Never true  Transportation Needs: No Transportation Needs (05/11/2023)   PRAPARE - Administrator, Civil Service (Medical): No     Lack of Transportation (Non-Medical): No  Physical Activity: Unknown (05/11/2023)   Exercise Vital Sign    Days of Exercise per Week: 0 days    Minutes of Exercise per Session: Not on file  Stress: Stress Concern Present (05/11/2023)   [employer] of Occupational Health - Occupational Stress Questionnaire    Feeling of Stress : To some extent  Social Connections: Moderately Integrated (05/11/2023)   Social Connection and Isolation Panel    Frequency of Communication with Friends and Family: More than three times a week    Frequency of Social Gatherings with Friends and Family: Once a week    Attends Religious Services: 1 to 4 times per year    Active Member of [employer] or Organizations: No    Attends Engineer, structural: Not on file    Marital Status: Married   Family History  Problem Relation Age of Onset   Colon polyps Father    Cancer Father        Head and neck cancer. Has PEG   Hearing loss Father    Lupus Sister    Breast cancer Other    Breast cancer Other    Anxiety disorder Mother    Depression Mother    Diabetes  Mother    Early death Mother    Hypertension Mother    Miscarriages / [place] Mother    Anxiety disorder Sister    Diabetes Sister    Hypertension Sister    Arthritis Brother    Cancer Paternal Uncle    Colon cancer Neg Hx    Esophageal cancer Neg Hx    Rectal cancer Neg Hx    Stomach cancer Neg Hx    Allergies  Allergen Reactions   Ace Inhibitors Anaphylaxis   Effexor Xr [Venlafaxine Hcl]     Patient states caused symptomatic elevated serotonin levels   Prior to Admission medications   Medication Sig Start Date End Date Taking? Authorizing Provider  atorvastatin  (LIPITOR) 20 MG tablet Take 1 tablet (20 mg total) by mouth daily. 10/25/23  Yes Almarie Waddell NOVAK, NP  B Complex Vitamins (B COMPLEX PO) Take 1 capsule by mouth daily.   Yes [provider]  Cholecalciferol (VITAMIN D3) 10 MCG (400 UNIT) tablet daily. 05/05/19  Yes  [provider]  DULoxetine  (CYMBALTA ) 60 MG capsule Take 1 capsule (60 mg total) by mouth daily. 08/03/23  Yes Almarie Waddell NOVAK, NP  hydrOXYzine  (ATARAX ) 10 MG tablet Take 1 tablet (10 mg total) by mouth 3 (three) times daily as needed. 10/25/23  Yes Almarie Waddell NOVAK, NP  insulin  degludec (TRESIBA  FLEXTOUCH) 200 UNIT/ML FlexTouch Pen Inject 120 Units into the skin in the morning and at bedtime. Dose increase. 11/24/23  Yes Domenica Harlene LABOR, MD  Insulin  Lispro-aabc (LYUMJEV  KWIKPEN) 100 UNIT/ML KwikPen Inject 28 units prior to meals 3 times a day - add additional 1 unit for each 50 points blood glucose is over 200 for a max dose of 35 units 3 times a day (90 mL = 85 day supply) 11/24/23  Yes Domenica Harlene LABOR, MD  metFORMIN  (GLUCOPHAGE ) 1000 MG tablet Take 1 tablet (1,000 mg total) by mouth 2 (two) times daily with a meal. 10/01/23  Yes Almarie Waddell NOVAK, NP  metFORMIN  (GLUCOPHAGE -XR) 500 MG 24 hr tablet Take 1 tablet (500 mg total) by mouth daily with breakfast. 12/02/23  Yes Almarie Waddell NOVAK, NP  metoprolol  succinate (TOPROL -XL) 100 MG 24 hr tablet Take 1 tablet (100 mg total) by mouth daily. WITH OR IMMEDIATELY FOLLOWING A MEAL 12/02/23 12/01/24 Yes Almarie Waddell NOVAK, NP  Multiple Vitamins-Calcium  (ONE-A-DAY WOMENS FORMULA PO) Take 1 tablet by mouth daily.   Yes [provider]  olmesartan -hydrochlorothiazide  (BENICAR  HCT) 20-12.5 MG tablet Take 1 tablet by mouth daily. 03/10/23 03/09/24 Yes Marylu Gee, DO  Omega-3 Fatty Acids  (FISH OIL ) 1000 MG CAPS Take 1 capsule (1,000 mg total) by mouth daily. 04/23/21  Yes Christian, Rylee, MD  omeprazole  (PRILOSEC) 20 MG capsule Take 1 capsule (20 mg total) by mouth daily. 10/25/23  Yes Almarie Waddell NOVAK, NP  Probiotic Product (PROBIOTIC-10 PO) Take by mouth.   Yes [provider]  sitaGLIPtin  (JANUVIA ) 100 MG tablet Take 1 tablet (100 mg total) by mouth daily. 07/14/23  Yes Almarie Waddell NOVAK, NP  Continuous Glucose Sensor (FREESTYLE LIBRE 3 PLUS SENSOR) MISC Change  sensor every 15 days. 09/06/23   Almarie Waddell NOVAK, NP  fexofenadine  (ALLEGRA  ALLERGY) 180 MG tablet Take 1 tablet (180 mg total) by mouth daily for 15 days. 08/21/23 09/05/23  Teddy Sharper, FNP  Insulin  Syringe-Needle U-100 31G X 15/64 1 ML MISC Use with insulin  pens (both long acting and short acting) to inject insulin  up to 5 times per day. 11/19/23   Domenica Harlene  A, MD  ondansetron  (ZOFRAN -ODT) 4 MG disintegrating tablet Dissolve 1 tablet (4 mg total) by mouth every 8 (eight) hours as needed for vomiting or nausea. 07/12/23   Roselyn Carlin NOVAK, MD     Positive ROS: None  All other systems have been reviewed and were otherwise negative with the exception of those mentioned in the HPI and as above.  Physical Exam: Vitals:   12/06/23 1037  BP: 130/79  Pulse: 67  Resp: 18  Temp: 97.7 F (36.5 C)  SpO2: 99%    General: Alert, no acute distress Cardiovascular: No pedal edema Respiratory: No cyanosis, no use of accessory musculature GI: No organomegaly, abdomen is soft and non-tender Skin: No lesions in the area of chief complaint Neurologic: Sensation intact distally Psychiatric: Patient is competent for consent with normal mood and affect Lymphatic: No axillary or cervical lymphadenopathy  MUSCULOSKELETAL: Left knee: Tender to palpation over the medial joint line.  Positive Murray.  No instability.  Trace effusion.  No erythema or warmth.  Assessment/Plan: LEFT KNEE MEDIAL MENISCAL TEAR Plan for Procedure(s): ARTHROSCOPY, KNEE, WITH MEDIAL MENISCECTOMY  The risks benefits and alternatives were discussed with the patient including but not limited to the risks of nonoperative treatment, versus surgical intervention including infection, bleeding, nerve injury, malunion, nonunion, hardware prominence, hardware failure, need for hardware removal, blood clots, cardiopulmonary complications, morbidity, mortality, among others, and they were willing to proceed.  Predicted outcome is good,  although there will be at least a six to nine month expected recovery.  Norleen LITTIE Gavel, MD 12/06/2023 11:29 AM

## 2023-12-06 NOTE — Transfer of Care (Signed)
 Immediate Anesthesia Transfer of Care Note  Patient: Kelly Gallagher  Procedure(s) Performed: ARTHROSCOPY, KNEE, WITH MEDIAL MENISCECTOMY and chondroplasty, loose body excision (Left: Knee)  Patient Location: PACU  Anesthesia Type:General  Level of Consciousness: awake and alert   Airway & Oxygen Therapy: Patient Spontanous Breathing and Patient connected to face mask oxygen  Post-op Assessment: Report given to RN and Post -op Vital signs reviewed and stable  Post vital signs: Reviewed and stable  Last Vitals:  Vitals Value Taken Time  BP 105/57 12/06/23 13:46  Temp 36.3 C 12/06/23 13:46  Pulse 73 12/06/23 13:54  Resp 17 12/06/23 13:54  SpO2 97 % 12/06/23 13:54  Vitals shown include unfiled device data.  Last Pain:  Vitals:   12/06/23 1353  TempSrc:   PainSc: 7          Complications: No notable events documented.

## 2023-12-06 NOTE — Anesthesia Procedure Notes (Signed)
 Procedure Name: LMA Insertion Date/Time: 12/06/2023 1:03 PM  Performed by: Franchot Izetta ORN, CRNAPre-anesthesia Checklist: Patient identified, Emergency Drugs available, Suction available and Patient being monitored Patient Re-evaluated:Patient Re-evaluated prior to induction Oxygen Delivery Method: Circle system utilized Preoxygenation: Pre-oxygenation with 100% oxygen Induction Type: IV induction Ventilation: Mask ventilation without difficulty LMA: LMA inserted LMA Size: 4.0 Number of attempts: 1 Placement Confirmation: positive ETCO2 and breath sounds checked- equal and bilateral Tube secured with: Tape Dental Injury: Teeth and Oropharynx as per pre-operative assessment

## 2023-12-06 NOTE — Anesthesia Postprocedure Evaluation (Signed)
 Anesthesia Post Note  Patient: Kelly Gallagher  Procedure(s) Performed: ARTHROSCOPY, KNEE, WITH MEDIAL MENISCECTOMY and chondroplasty, loose body excision (Left: Knee)     Patient location during evaluation: PACU Anesthesia Type: General Level of consciousness: awake and alert, oriented and patient cooperative Pain management: pain level controlled Vital Signs Assessment: post-procedure vital signs reviewed and stable Respiratory status: spontaneous breathing, nonlabored ventilation and respiratory function stable Cardiovascular status: blood pressure returned to baseline and stable Postop Assessment: no apparent nausea or vomiting and adequate PO intake Anesthetic complications: no   No notable events documented.  Last Vitals:  Vitals:   12/06/23 1500 12/06/23 1519  BP: 130/77 (!) 156/95  Pulse: 69 61  Resp: 13 16  Temp:  (!) 36.2 C  SpO2: 93% 95%    Last Pain:  Vitals:   12/06/23 1519  TempSrc:   PainSc: 4                  Dajae Kizer,E. Tegh Franek

## 2023-12-06 NOTE — Discharge Instructions (Addendum)
 POST-OP KNEE ARTHROSCOPY INSTRUCTIONS  Dr. Norleen Inga Reining PA-C  Pain You will be expected to have a moderate amount of pain in the affected knee for approximately two weeks. However, the first two days will be the most severe pain. A prescription has been provided to take as needed for the pain. The pain can be reduced by applying ice packs to the knee for the first 1-2 weeks post surgery. Also, keeping the leg elevated on pillows will help alleviate the pain. If you develop any acute pain or swelling in your calf muscle, please call the doctor.  Activity It is preferred that you stay at bed rest for approximately 24 hours. However, you may go to the bathroom with help. Weight bearing as tolerated. You may begin the knee exercises the day of surgery. Discontinue crutches as the knee pain resolves.  Dressing Keep the dressing dry. If the ace bandage should wrinkle or roll up, this can be rewrapped to prevent ridges in the bandage. You may remove all dressings in 48 hours,  apply bandaids to each wound. You may shower on the 4th day after surgery but no tub bath.  Symptoms to report to your doctor Extreme pain Extreme swelling Temperature above 101 degrees Change in the feeling, color, or movement of your toes Redness, heat, or swelling at your incision  Exercise If is preferred that as soon as possible you try to do a straight leg raise without bending the knee and concentrate on bringing the heel of your foot off the bed up to approximately 45 degrees and hold for the count of 10 seconds. Repeat this at least 10 times three or four times per day. Additional exercises are provided below.  You are encouraged to bend the knee as tolerated.  Follow-Up Call to schedule a follow-up appointment in 5-7 days. Our office # is [phone].  POST-OP EXERCISES  Short Arc Quads  1. Lie on back with legs straight. Place towel roll under thigh, just above knee. 2. Tighten thigh muscles to  straighten knee and lift heel off bed. 3. Hold for slow count of five, then lower. 4. Do three sets of ten    Straight Leg Raises  1. Lie on back with operative leg straight and other leg bent. 2. Keeping operative leg completely straight, slowly lift operative leg so foot is 5 inches off bed. 3. Hold for slow count of five, then lower. 4. Do three sets of ten.    DO BOTH EXERCISES 2 TIMES A DAY  Ankle Pumps  Work/move the operative ankle and foot up and down 10 times every hour while awake.    Post Anesthesia Home Care Instructions  Activity: Get plenty of rest for the remainder of the day. A responsible individual must stay with you for 24 hours following the procedure.  For the next 24 hours, DO NOT: -Drive a car -Advertising copywriter -Drink alcoholic beverages -Take any medication unless instructed by your physician -Make any legal decisions or sign important papers.  Meals: Start with liquid foods such as gelatin or soup. Progress to regular foods as tolerated. Avoid greasy, spicy, heavy foods. If nausea and/or vomiting occur, drink only clear liquids until the nausea and/or vomiting subsides. Call your physician if vomiting continues.  Special Instructions/Symptoms: Your throat may feel dry or sore from the anesthesia or the breathing tube placed in your throat during surgery. If this causes discomfort, gargle with warm salt water. The discomfort should disappear within 24 hours.  If  you had a scopolamine  patch placed behind your ear for the management of post- operative nausea and/or vomiting:  1. The medication in the patch is effective for 72 hours, after which it should be removed.  Wrap patch in a tissue and discard in the trash. Wash hands thoroughly with soap and water. 2. You may remove the patch earlier than 72 hours if you experience unpleasant side effects which may include dry mouth, dizziness or visual disturbances. 3. Avoid touching the patch. Wash your  hands with soap and water after contact with the patch.     Tylenol  can be taken after 4:45 pm if needed

## 2023-12-07 ENCOUNTER — Encounter (HOSPITAL_COMMUNITY): Payer: Self-pay

## 2023-12-07 ENCOUNTER — Other Ambulatory Visit: Payer: Self-pay

## 2023-12-07 ENCOUNTER — Other Ambulatory Visit (HOSPITAL_COMMUNITY): Payer: Self-pay

## 2023-12-07 ENCOUNTER — Encounter (HOSPITAL_BASED_OUTPATIENT_CLINIC_OR_DEPARTMENT_OTHER): Payer: Self-pay | Admitting: Orthopedic Surgery

## 2023-12-07 NOTE — Op Note (Signed)
 Pre-Op Ik:Fzipjo meniscal tear and chondromalacia patellofemoral jointLeft knee  Postop Dx:1.Medial meniscal tearLeft knee 2.  Loose bodyLeft knee 3.  Chondromalacia  medial femoral compartment, and patellofemoral joint  Procedure:Left knee arthroscopy with partial medial meniscectomy, abrasion arthroplasty medial and patellofemoral, and loose body excision  Surgeon: Norleen Gavel M.D.  Assist: Signe Reining PA-C  (present throughout entire procedure and necessary for timely completion of the procedure) Anes: General LMA  EBL: Minimal  Fluids: 800 cc   Indications: This is a female who had undergone evaluation of left knee pain.  She been treated conservatively with activity modification, injection therapy, medication, and time.  She failed all conservative care at which time MRI was obtained.  MRI showed medial meniscal tear.  She is brought the operating room for this diagnosis for fixation as needed.. Pt has failed conservative treatment with anti-inflammatory medicines, physical therapy, and modified activites but did get good temporarily from an intra-articular cortisone injection. Pain has recurred and patient desires elective arthroscopic evaluation and treatment of knee. Risks and benefits of surgery have been discussed and questions answered.  Procedure: Patient identified by arm band and taken to the operating room at the day surgery Center. The appropriate anesthetic monitors were attached, and General LMA anesthesia was induced without difficulty. Lateral post was applied to the table and the lower extremity was prepped and draped in usual sterile fashion from the ankle to the midthigh. Time out procedure was performed. We began the operation by making standard inferior lateral and inferior medial peripatellar portals with a #11 blade allowing introduction of the arthroscope through the inferior lateral portal and the out flow to the inferior medial portal. Pump pressure was set at 100 mmHg  and diagnostic arthroscopy  revealed There was chondromalacia the patellofemoral joint.  There is 2 x 2 cm area and the posterior aspect of the patella.  There is a 2 x 2 cm area corresponding in the trochlea.  These were debrided back to a stable rim of articular cartilage down to bleeding bone where necessary.  Attention was turned to the medial compartment where there is a complex tear of the posterior horn of the medial meniscus back near the root.  We did a debridement of the posterior horn of the meniscus and tailor this meniscus all the way around to the mid body of the meniscus.  There was a large defect on the medial femoral condyle with loose and fragmenting pieces of articular cartilage.  These loose bodies were removed with a combination of grasper and suction shaver.  That articular cartilage lesion was a proximally 3 x 3 cm and we debrided it with a suction shaver down to bleeding bone where necessary.Attention is then turned to the ACL which was normal.Attention was turned to the lateral side where there was a normal meniscus and normal lateral femoral condyle. The knee was irrigated out normal saline solution. A dressing of xerofoam 4 x 4 dressing sponges, web roll and an Ace wrap was applied. The patient was awakened extubated and taken to the recovery without difficulty.   Norleen Gavel MD

## 2023-12-09 ENCOUNTER — Other Ambulatory Visit: Payer: Self-pay

## 2023-12-09 ENCOUNTER — Other Ambulatory Visit (HOSPITAL_COMMUNITY): Payer: Self-pay

## 2023-12-09 ENCOUNTER — Other Ambulatory Visit: Payer: Self-pay | Admitting: Pharmacist

## 2023-12-09 NOTE — Progress Notes (Signed)
 12/09/2023 Name: Kelly Gallagher MRN: [id number] DOB: 01-14-1971  Chief Complaint  Patient presents with   Diabetes    Kelly Gallagher is a 53 y.o. year old female who presented for a telephone visit.   They were referred to the pharmacist by their PCP for assistance in managing diabetes.    Subjective:   Care Team: Primary Care Provider: Almarie Waddell NOVAK, NP ; Next Scheduled Visit: not currently scheduled.   Patient had knee surgery 12/06/2023. Today she reports she is doing well. She has been getting up and even had a shower today. Follow up with surgeon is scheduled for next Friday 9/26.   Medication Access/Adherence  Current Pharmacy:  [facility] - [facility] [address] [place] [address] Phone: [phone] Fax: [phone]  [facility] [facility] [address] [place] [address] Phone: [phone] Fax: [phone]  [facility] [phone] - [place] - [address] AT [facility] [address] [place] [address] Phone: [phone] Fax: [phone]  [place] - [facility] [address], [address] [place] [address] Phone: [phone] Fax: [phone]   Patient reports affordability concerns with their medications: No  - Patient is currently enrolled in Diabetes Management Program for [employer] Employees.  Patient reports access/transportation concerns to their pharmacy: No    Diabetes:  Current medications:  Tresiba  U200 - inject 116 to 120 units twice a day Insulin  lyumjev  - 25 units with each meal 3 times a day Metformin  1000mg  twice a day Metformin  ER 500mg  once a day Januvia  100mg  once a day  Medications tried in the past:  Mounjaro  and Ozempic  - caused severe nausea and diarrhea (on higher doses) - seen at hospital for dehydration. She has been off GLP 1 type agent since around April 2025 and has  gained about 20lbs back.  Jardiance  25mg  - took around 12/2021 but stopped due to frequent yeast infections.   Current glucose readings: see Continuous Glucose Monitor report below Using Libre 3+ sensors  Date of Download:12/09/2023 % Time CGM is active: 86% Average Glucose: 244 mg/dL (previously was 693) Glucose Management Indicator: 9.1% (previously was 10.6%) Glucose Variability: 20% (Previously was 16.5%) (goal <36%) Time in Goal:  - Time in range 70-180: 10% (previously was 0%) - Time above range: 90% (previously was 100%) - Time below range: 0% Observed patterns: Blood glucose has improved over the last 7 days since insulin  dose has been increased, not at goal yet.          Diet: patient has been trying very hard to limit portion sizes and carbs.    Current medication access support: uses manufacturer coupon with Tresiba  at pharmacy   Objective:  Lab Results  Component Value Date   HGBA1C 11.9 (H) 09/30/2023    Lab Results  Component Value Date   CREATININE 0.75 11/30/2023   BUN 14 11/30/2023   NA 133 (L) 11/30/2023   K 4.6 11/30/2023   CL 97 (L) 11/30/2023   CO2 23 11/30/2023    Lab Results  Component Value Date   CHOL 119 09/30/2023   HDL 43.20 09/30/2023   LDLCALC 24 09/30/2023   TRIG 259.0 (H) 09/30/2023   CHOLHDL 3 09/30/2023    Medications Reviewed Today   Medications were not reviewed in this encounter       Assessment/Plan:   Diabetes: Currently  uncontrolled based on A1c but per Continuous Glucose Monitor report GMI and blood glucose has improved over the last 14 days (GMI down to 9.6%) - Recommend to continue Tresiba  U200 to 116 to 120 units twice a day - Continue insulin  lyumgev to 25 units with meals. (Add additional lyumgev - add 1 extra unit to base amount for each 50 points blood glucose is > 120) - Continue metformin  2500mg  / day and Januvia  100mg  daily  - Plan to retry low dose Ozempic  - will start with 0.25mg  weekly for 4 to  6 weeks and increase slowly since patient has had GI side effects in past - provided # 1 sample pen. - Reviewed blood glucose report. Continue to use Continuous Glucose Monitor to check blood glucose several times a day.   Follow Up Plan: 2 to 3 weeks.   Madelin Ray, PharmD Clinical Pharmacist [facility]

## 2023-12-10 ENCOUNTER — Encounter: Payer: Self-pay | Admitting: Pharmacist

## 2023-12-10 ENCOUNTER — Other Ambulatory Visit: Payer: Self-pay

## 2023-12-12 ENCOUNTER — Other Ambulatory Visit (HOSPITAL_COMMUNITY): Payer: Self-pay

## 2023-12-12 ENCOUNTER — Other Ambulatory Visit: Payer: Self-pay | Admitting: Pharmacist

## 2023-12-12 DIAGNOSIS — E1142 Type 2 diabetes mellitus with diabetic polyneuropathy: Secondary | ICD-10-CM

## 2023-12-12 MED ORDER — INSULIN SYRINGE-NEEDLE U-100 31G X 15/64" 1 ML MISC
1 refills | Status: DC
  Filled 2023-12-12: qty 500, fill #0

## 2023-12-12 NOTE — Telephone Encounter (Signed)
 Patient requested update Rx for insulin  pen needles with use 5 to 6 times a day (she is currently injecting Tresiba  twice a day and Lyumjev  3 times a day but sometimes has to inject a 6th time when pen is at the end or for correction).  Sent in updated Rx.

## 2023-12-13 ENCOUNTER — Other Ambulatory Visit (HOSPITAL_COMMUNITY): Payer: Self-pay

## 2023-12-13 ENCOUNTER — Encounter: Payer: Self-pay | Admitting: Pharmacist

## 2023-12-13 ENCOUNTER — Other Ambulatory Visit: Payer: Self-pay | Admitting: Family Medicine

## 2023-12-13 ENCOUNTER — Other Ambulatory Visit: Payer: Self-pay

## 2023-12-13 MED ORDER — OZEMPIC (0.25 OR 0.5 MG/DOSE) 2 MG/3ML ~~LOC~~ SOPN: 0.2500 mg | PEN_INJECTOR | SUBCUTANEOUS | Status: DC

## 2023-12-13 NOTE — Addendum Note (Signed)
 Addended by: CARLA MILLING B on: 12/13/2023 08:07 AM   Modules accepted: Orders

## 2023-12-14 ENCOUNTER — Other Ambulatory Visit: Payer: Self-pay

## 2023-12-15 ENCOUNTER — Other Ambulatory Visit (HOSPITAL_COMMUNITY): Payer: Self-pay

## 2023-12-15 ENCOUNTER — Other Ambulatory Visit: Payer: Self-pay | Admitting: Family Medicine

## 2023-12-15 ENCOUNTER — Other Ambulatory Visit: Payer: Self-pay

## 2023-12-15 DIAGNOSIS — E1142 Type 2 diabetes mellitus with diabetic polyneuropathy: Secondary | ICD-10-CM

## 2023-12-15 MED ORDER — INSUPEN PEN NEEDLES 32G X 4 MM MISC
3 refills | Status: DC
  Filled 2023-12-15: qty 200, fill #0
  Filled 2023-12-23: qty 200, 90d supply, fill #0

## 2023-12-15 MED ORDER — INSULIN SYRINGE-NEEDLE U-100 31G X 15/64" 1 ML MISC
1 refills | Status: DC
  Filled 2023-12-15: qty 500, fill #0

## 2023-12-16 ENCOUNTER — Other Ambulatory Visit: Payer: Self-pay

## 2023-12-16 DIAGNOSIS — M25562 Pain in left knee: Secondary | ICD-10-CM | POA: Diagnosis not present

## 2023-12-16 DIAGNOSIS — R2689 Other abnormalities of gait and mobility: Secondary | ICD-10-CM | POA: Diagnosis not present

## 2023-12-16 DIAGNOSIS — M25662 Stiffness of left knee, not elsewhere classified: Secondary | ICD-10-CM | POA: Diagnosis not present

## 2023-12-20 DIAGNOSIS — R2689 Other abnormalities of gait and mobility: Secondary | ICD-10-CM | POA: Diagnosis not present

## 2023-12-20 DIAGNOSIS — M25562 Pain in left knee: Secondary | ICD-10-CM | POA: Diagnosis not present

## 2023-12-20 DIAGNOSIS — M25662 Stiffness of left knee, not elsewhere classified: Secondary | ICD-10-CM | POA: Diagnosis not present

## 2023-12-21 MED FILL — Duloxetine HCl Enteric Coated Pellets Cap 60 MG (Base Eq): ORAL | 30 days supply | Qty: 30 | Fill #4 | Status: AC

## 2023-12-22 ENCOUNTER — Other Ambulatory Visit (HOSPITAL_COMMUNITY): Payer: Self-pay

## 2023-12-22 DIAGNOSIS — M25662 Stiffness of left knee, not elsewhere classified: Secondary | ICD-10-CM | POA: Diagnosis not present

## 2023-12-22 DIAGNOSIS — M25562 Pain in left knee: Secondary | ICD-10-CM | POA: Diagnosis not present

## 2023-12-22 DIAGNOSIS — R2689 Other abnormalities of gait and mobility: Secondary | ICD-10-CM | POA: Diagnosis not present

## 2023-12-23 ENCOUNTER — Other Ambulatory Visit: Payer: Self-pay

## 2023-12-24 ENCOUNTER — Telehealth (HOSPITAL_BASED_OUTPATIENT_CLINIC_OR_DEPARTMENT_OTHER): Payer: Self-pay | Admitting: Pharmacist

## 2023-12-24 ENCOUNTER — Other Ambulatory Visit (HOSPITAL_COMMUNITY): Payer: Self-pay

## 2023-12-24 ENCOUNTER — Other Ambulatory Visit: Payer: Self-pay

## 2023-12-24 DIAGNOSIS — E1142 Type 2 diabetes mellitus with diabetic polyneuropathy: Secondary | ICD-10-CM

## 2023-12-24 MED ORDER — INSULIN SYRINGE-NEEDLE U-100 31G X 15/64" 1 ML MISC
1 refills | Status: DC
Start: 2023-12-24 — End: 2023-12-29
  Filled 2023-12-24: qty 500, fill #0

## 2023-12-24 NOTE — Telephone Encounter (Signed)
 Received the following request from patient in MyChart>  Hi Kelly Gallagher,  I still haven't received my pen needles or my Tresiba . I've been using my Lantus  until we can figure this out. But, I don't know if it's the Lantus , the low dose of Ozempic , or the attention to carbs. but my blood sugar has been much improved. I honestly am afraid to change anything at the moment. But, it's a little concerning that after calling the pharmacy twice, my Tresiba  and pen needles are still "in progress." Can you help me out?  PS: attach GMI since taking Ozempic . Down over 2 points for the past two weeks. Woohoo! Thank you!  Kelly Gallagher with pharmacy technician with [facility]. They pen needles ready but has old directions of use twice a day - patient is on long and short acting insulin  and sometimes uses up to 6 needles per day. Sent in updated Rx.   Meds ordered this encounter  Medications   Insulin  Syringe-Needle U-100 31G X 15/64 1 ML MISC    Sig: Use with insulin  pens (both long acting and short acting) to inject insulin  up to 6 times per day.    Dispense:  500 each    Refill:  1    Update directions; Please cancel Rx with BID use.   They also were holding Tresiba  due to cost of Rx ($396.00 for 90 days). Pharmacy technician states she will reach out to patient to verify OK to send full 90 day supply.   Ok to use Lantus  until Tresiba  arrives.  Patient's blood glucose has improved with addition of Ozempic  0.25mg  weekly. Will continue current dose. Follow up planned in 2 weeks 01/03/2024.   Madelin Ray, PharmD Clinical Pharmacist [facility]

## 2023-12-27 DIAGNOSIS — R2689 Other abnormalities of gait and mobility: Secondary | ICD-10-CM | POA: Diagnosis not present

## 2023-12-27 DIAGNOSIS — M25562 Pain in left knee: Secondary | ICD-10-CM | POA: Diagnosis not present

## 2023-12-27 DIAGNOSIS — R531 Weakness: Secondary | ICD-10-CM | POA: Diagnosis not present

## 2023-12-28 ENCOUNTER — Other Ambulatory Visit (HOSPITAL_COMMUNITY): Payer: Self-pay

## 2023-12-29 ENCOUNTER — Other Ambulatory Visit: Payer: Self-pay

## 2023-12-29 ENCOUNTER — Other Ambulatory Visit (HOSPITAL_COMMUNITY): Payer: Self-pay

## 2023-12-29 ENCOUNTER — Other Ambulatory Visit: Payer: Self-pay | Admitting: Pharmacist

## 2023-12-29 MED ORDER — PEN NEEDLES 31G X 5 MM MISC
1 refills | Status: AC
  Filled 2023-12-29: qty 500, 83d supply, fill #0
  Filled 2024-03-28 (×2): qty 500, 83d supply, fill #1

## 2023-12-30 DIAGNOSIS — M25562 Pain in left knee: Secondary | ICD-10-CM | POA: Diagnosis not present

## 2023-12-30 DIAGNOSIS — R2689 Other abnormalities of gait and mobility: Secondary | ICD-10-CM | POA: Diagnosis not present

## 2023-12-30 DIAGNOSIS — R531 Weakness: Secondary | ICD-10-CM | POA: Diagnosis not present

## 2024-01-03 ENCOUNTER — Other Ambulatory Visit (INDEPENDENT_AMBULATORY_CARE_PROVIDER_SITE_OTHER): Admitting: Pharmacist

## 2024-01-03 DIAGNOSIS — E1142 Type 2 diabetes mellitus with diabetic polyneuropathy: Secondary | ICD-10-CM

## 2024-01-03 DIAGNOSIS — M25562 Pain in left knee: Secondary | ICD-10-CM | POA: Diagnosis not present

## 2024-01-03 DIAGNOSIS — R531 Weakness: Secondary | ICD-10-CM | POA: Diagnosis not present

## 2024-01-03 DIAGNOSIS — R2689 Other abnormalities of gait and mobility: Secondary | ICD-10-CM | POA: Diagnosis not present

## 2024-01-03 DIAGNOSIS — Z794 Long term (current) use of insulin: Secondary | ICD-10-CM

## 2024-01-03 NOTE — Progress Notes (Signed)
 01/03/2024 Name: Kelly Gallagher MRN: [id number] DOB: 1971-01-29  Chief Complaint  Patient presents with   Diabetes    Kelly Gallagher is a 53 y.o. year old female who presented for a telephone visit.   They were referred to the pharmacist by their PCP for assistance in managing diabetes.    Subjective:   Care Team: Primary Care Provider: Almarie Waddell NOVAK, NP ; Next Scheduled Visit: not currently scheduled.   Patient had knee surgery 12/06/2023. Today she reports she is doing well. She has been back at work since 12/13/2023   Medication Access/Adherence  Current Pharmacy:  [facility] - [facility] [address] [place] [address] Phone: [phone] Fax: [phone]  [facility] - [facility] [address] [place] [address] Phone: [phone] Fax: [phone]  [facility]ORE [phone] - [place], [place] - [address] AT [facility] [address] [place] [address] Phone: [phone] Fax: [phone]  [place] - [facility] [address] [place] [address] Phone: [phone] Fax: [phone]   Patient reports affordability concerns with their medications: No  - Patient is currently enrolled in Diabetes Management Program for [employer] [facility] Employees.  Patient reports access/transportation concerns to their pharmacy: No    Diabetes:  Current medications:  Ozempic  0.25mg  weekly (started about 2 or 3 weeks ago)  Tresiba  U200 - inject 116 to 120 units twice a day Insulin  lyumjev  - 25 units with each meal 3 times a day Metformin  1000mg  twice a day Metformin  ER 500mg  once a day Januvia  100mg  once a day  Medications tried in the past:  Mounjaro  and Ozempic  - caused severe nausea and diarrhea (on higher doses) - seen at hospital for dehydration. She has been off GLP 1 type agent since around April 2025 and has gained about  20lbs back.  Jardiance  25mg  - took around 12/2021 but stopped due to frequent yeast infections.   Current glucose readings: see Continuous Glucose Monitor report below Using Libre 3+ sensors  Date of Download:01/03/2024 % Time CGM is active: 83% Average Glucose: 192 mg/dL (previously was 755) Glucose Management Indicator: 7.9% (previously was 9.1%) Glucose Variability: 25.1% (Previously was 20%) (goal <36%) Time in Goal:  - Time in range 70-180: 41% (previously was 10%) - Time above range: 59% (previously was 90%) - Time below range: 0% Observed patterns: Blood glucose has improved over the last 14 days since starting Ozmepic 0.25mg  weekly          Diet: patient has been trying very hard to limit portion sizes and carbs.    Denies nausea except very mild from time to time. She has not needed to take ondansetron .   Current medication access support: uses manufacturer coupon with Tresiba  at pharmacy   Objective:  Lab Results  Component Value Date   HGBA1C 11.9 (H) 09/30/2023    Lab Results  Component Value Date   CREATININE 0.75 11/30/2023   BUN 14 11/30/2023   NA 133 (L) 11/30/2023   K 4.6 11/30/2023   CL 97 (L) 11/30/2023   CO2 23 11/30/2023    Lab Results  Component Value Date   CHOL 119 09/30/2023   HDL 43.20 09/30/2023   LDLCALC 24 09/30/2023   TRIG 259.0 (H) 09/30/2023   CHOLHDL 3 09/30/2023    Medications Reviewed Today     Reviewed by Carla Milling, RPH-CPP (  Pharmacist) on 01/03/24 at 1620  Med List Status: <None>   Medication Order Taking? Sig Documenting Provider Last Dose Status Informant  atorvastatin  (LIPITOR) 20 MG tablet [id number]  Take 1 tablet (20 mg total) by mouth daily. Almarie Waddell NOVAK, NP  Active   B Complex Vitamins (B COMPLEX PO) [id number]  Take 1 capsule by mouth daily. [provider]  Active   Cholecalciferol (VITAMIN D3) 10 MCG (400 UNIT) tablet [id number]  daily. [provider]  Active   Continuous Glucose  Sensor (FREESTYLE LIBRE 3 PLUS SENSOR) MISC [id number]  Change sensor every 15 days. Almarie Waddell NOVAK, NP  Active   DULoxetine  (CYMBALTA ) 60 MG capsule [id number]  Take 1 capsule (60 mg total) by mouth daily. Almarie Waddell NOVAK, NP  Active   fexofenadine  (ALLEGRA  ALLERGY) 180 MG tablet [id number]  Take 1 tablet (180 mg total) by mouth daily for 15 days. Teddy Sharper, FNP  Expired 09/05/23 2359   hydrOXYzine  (ATARAX ) 10 MG tablet [id number]  Take 1 tablet (10 mg total) by mouth 3 (three) times daily as needed. Almarie Waddell NOVAK, NP  Active   insulin  degludec (TRESIBA  FLEXTOUCH) 200 UNIT/ML FlexTouch Pen [id number]  Inject 120 Units into the skin in the morning and at bedtime. Dose increase. Domenica Harlene LABOR, MD  Active   Insulin  Lispro-aabc (LYUMJEV  KWIKPEN) 100 UNIT/ML KwikPen [id number]  Inject 28 units prior to meals 3 times a day - add additional 1 unit for each 50 points blood glucose is over 200 for a max dose of 35 units 3 times a day (90 mL = 85 day supply) Domenica Harlene LABOR, MD  Active   Insulin  Pen Needle (PEN NEEDLES) 31G X 5 MM MISC [id number]  Use with insulin  pens (both long acting and short acting) to inject insulin  up to 6 times per day. Domenica Harlene LABOR, MD  Active   metFORMIN  (GLUCOPHAGE ) 1000 MG tablet [id number] Yes Take 1 tablet (1,000 mg total) by mouth 2 (two) times daily with a meal. Almarie Waddell NOVAK, NP  Active   metFORMIN  (GLUCOPHAGE -XR) 500 MG 24 hr tablet [id number] Yes Take 1 tablet (500 mg total) by mouth daily with breakfast. Almarie Waddell NOVAK, NP  Active   metoprolol  succinate (TOPROL -XL) 100 MG 24 hr tablet [id number]  Take 1 tablet (100 mg total) by mouth daily. WITH OR IMMEDIATELY FOLLOWING A MEAL Almarie Waddell NOVAK, NP  Active   Multiple Vitamins-Calcium  (ONE-A-DAY WOMENS FORMULA PO) [id number]  Take 1 tablet by mouth daily. [provider]  Active   olmesartan -hydrochlorothiazide  (BENICAR  HCT) 20-12.5 MG tablet [id number]  Take 1 tablet by mouth daily. Marylu Gee, DO  Active   Omega-3  Fatty Acids (FISH OIL ) 1000 MG CAPS [id number]  Take 1 capsule (1,000 mg total) by mouth daily. Sherlean Failing, MD  Active   omeprazole  (PRILOSEC) 20 MG capsule [id number]  Take 1 capsule (20 mg total) by mouth daily. Almarie Waddell NOVAK, NP  Active   ondansetron  (ZOFRAN -ODT) 4 MG disintegrating tablet [id number] Yes Dissolve 1 tablet (4 mg total) by mouth every 8 (eight) hours as needed for vomiting or nausea. Roselyn Carlin NOVAK, MD  Active   Probiotic Product (PROBIOTIC-10 PO) [id number]  Take by mouth. [provider]  Active   Semaglutide ,0.25 or 0.5MG /DOS, (OZEMPIC , 0.25 OR 0.5 MG/DOSE,) 2 MG/3ML SOPN [id number] Yes Inject 0.25 mg into the skin once a week. Carla Milling, RPH-CPP  Active               Assessment/Plan:  Diabetes: Currently uncontrolled based on A1c but per Continuous Glucose Monitor report GMI and blood glucose has improved over the last 14 days (GMI down to 7.9%) - Recommend to continue Tresiba  U200 to 116 to 120 units twice a day - Continue insulin  lyumgev to 25 units with meals. (Add additional lyumgev - add 1 extra unit to base amount for each 50 points blood glucose is > 120) - Continue metformin  2500mg  / day and Januvia  100mg  daily  - Continue Ozempic  - 0.25mg  weekly for 4 to 6 weeks and increase slowly since patient has had GI side effects in past. - Reviewed blood glucose report. Continue to use Continuous Glucose Monitor to check blood glucose several times a day.   Follow Up Plan: 2 to 3 weeks to see PCP and check A1c.  Clinical Pharmacist Practitioner with review Continuous Glucose Monitor in 2 weeks and follow up by phone as needed.   Madelin Ray, PharmD Clinical Pharmacist [facility]

## 2024-01-05 DIAGNOSIS — R531 Weakness: Secondary | ICD-10-CM | POA: Diagnosis not present

## 2024-01-05 DIAGNOSIS — R2689 Other abnormalities of gait and mobility: Secondary | ICD-10-CM | POA: Diagnosis not present

## 2024-01-05 DIAGNOSIS — M25562 Pain in left knee: Secondary | ICD-10-CM | POA: Diagnosis not present

## 2024-01-06 ENCOUNTER — Other Ambulatory Visit: Payer: Self-pay

## 2024-01-11 ENCOUNTER — Other Ambulatory Visit: Payer: Self-pay | Admitting: Medical Genetics

## 2024-01-11 DIAGNOSIS — Z006 Encounter for examination for normal comparison and control in clinical research program: Secondary | ICD-10-CM

## 2024-01-13 DIAGNOSIS — M25562 Pain in left knee: Secondary | ICD-10-CM | POA: Diagnosis not present

## 2024-01-13 DIAGNOSIS — R531 Weakness: Secondary | ICD-10-CM | POA: Diagnosis not present

## 2024-01-13 DIAGNOSIS — R2689 Other abnormalities of gait and mobility: Secondary | ICD-10-CM | POA: Diagnosis not present

## 2024-01-19 ENCOUNTER — Encounter: Payer: Self-pay | Admitting: Pharmacist

## 2024-01-27 ENCOUNTER — Other Ambulatory Visit: Payer: Self-pay

## 2024-01-27 ENCOUNTER — Other Ambulatory Visit: Payer: Self-pay | Admitting: Family Medicine

## 2024-01-27 ENCOUNTER — Other Ambulatory Visit (HOSPITAL_COMMUNITY): Payer: Self-pay

## 2024-01-27 DIAGNOSIS — E1142 Type 2 diabetes mellitus with diabetic polyneuropathy: Secondary | ICD-10-CM

## 2024-01-27 DIAGNOSIS — F411 Generalized anxiety disorder: Secondary | ICD-10-CM

## 2024-01-27 MED ORDER — ATORVASTATIN CALCIUM 20 MG PO TABS
20.0000 mg | ORAL_TABLET | Freq: Every day | ORAL | 0 refills | Status: DC
  Filled 2024-01-27: qty 90, 90d supply, fill #0

## 2024-01-27 MED ORDER — METFORMIN HCL 1000 MG PO TABS
1000.0000 mg | ORAL_TABLET | Freq: Two times a day (BID) | ORAL | 0 refills | Status: DC
  Filled 2024-01-27: qty 180, 90d supply, fill #0

## 2024-01-27 MED ORDER — HYDROXYZINE HCL 10 MG PO TABS
10.0000 mg | ORAL_TABLET | Freq: Three times a day (TID) | ORAL | 0 refills | Status: DC | PRN
  Filled 2024-01-27: qty 270, 90d supply, fill #0

## 2024-01-27 MED FILL — Duloxetine HCl Enteric Coated Pellets Cap 60 MG (Base Eq): ORAL | 30 days supply | Qty: 30 | Fill #5 | Status: AC

## 2024-01-28 ENCOUNTER — Other Ambulatory Visit (HOSPITAL_BASED_OUTPATIENT_CLINIC_OR_DEPARTMENT_OTHER): Payer: Self-pay

## 2024-01-28 ENCOUNTER — Encounter: Payer: Self-pay | Admitting: Family Medicine

## 2024-01-28 ENCOUNTER — Ambulatory Visit: Admitting: Family Medicine

## 2024-01-28 VITALS — BP 133/57 | HR 70 | Ht 65.0 in | Wt 255.0 lb

## 2024-01-28 DIAGNOSIS — E1169 Type 2 diabetes mellitus with other specified complication: Secondary | ICD-10-CM | POA: Diagnosis not present

## 2024-01-28 DIAGNOSIS — E538 Deficiency of other specified B group vitamins: Secondary | ICD-10-CM

## 2024-01-28 DIAGNOSIS — E1159 Type 2 diabetes mellitus with other circulatory complications: Secondary | ICD-10-CM

## 2024-01-28 DIAGNOSIS — L989 Disorder of the skin and subcutaneous tissue, unspecified: Secondary | ICD-10-CM | POA: Diagnosis not present

## 2024-01-28 DIAGNOSIS — E1142 Type 2 diabetes mellitus with diabetic polyneuropathy: Secondary | ICD-10-CM

## 2024-01-28 DIAGNOSIS — F411 Generalized anxiety disorder: Secondary | ICD-10-CM | POA: Diagnosis not present

## 2024-01-28 DIAGNOSIS — K219 Gastro-esophageal reflux disease without esophagitis: Secondary | ICD-10-CM

## 2024-01-28 DIAGNOSIS — Z794 Long term (current) use of insulin: Secondary | ICD-10-CM | POA: Diagnosis not present

## 2024-01-28 DIAGNOSIS — I152 Hypertension secondary to endocrine disorders: Secondary | ICD-10-CM

## 2024-01-28 DIAGNOSIS — E559 Vitamin D deficiency, unspecified: Secondary | ICD-10-CM | POA: Diagnosis not present

## 2024-01-28 DIAGNOSIS — E785 Hyperlipidemia, unspecified: Secondary | ICD-10-CM

## 2024-01-28 LAB — COMPREHENSIVE METABOLIC PANEL WITH GFR
ALT: 41 U/L — ABNORMAL HIGH (ref 0–35)
AST: 38 U/L — ABNORMAL HIGH (ref 0–37)
Albumin: 4.4 g/dL (ref 3.5–5.2)
Alkaline Phosphatase: 102 U/L (ref 39–117)
BUN: 10 mg/dL (ref 6–23)
CO2: 29 meq/L (ref 19–32)
Calcium: 9.8 mg/dL (ref 8.4–10.5)
Chloride: 98 meq/L (ref 96–112)
Creatinine, Ser: 0.68 mg/dL (ref 0.40–1.20)
GFR: 100.65 mL/min (ref >60.00)
Glucose, Bld: 265 mg/dL — ABNORMAL HIGH (ref 70–99)
Potassium: 4.5 meq/L (ref 3.5–5.1)
Sodium: 138 meq/L (ref 135–145)
Total Bilirubin: 0.4 mg/dL (ref 0.2–1.2)
Total Protein: 6.8 g/dL (ref 6.0–8.3)

## 2024-01-28 LAB — VITAMIN B12: Vitamin B-12: 215 pg/mL (ref 211–911)

## 2024-01-28 LAB — HEMOGLOBIN A1C: Hgb A1c MFr Bld: 9.9 % — ABNORMAL HIGH (ref 4.6–6.5)

## 2024-01-28 MED ORDER — DOXYCYCLINE HYCLATE 100 MG PO TABS
100.0000 mg | ORAL_TABLET | Freq: Two times a day (BID) | ORAL | 0 refills | Status: AC
  Filled 2024-01-28: qty 10, 5d supply, fill #0

## 2024-01-28 NOTE — Assessment & Plan Note (Signed)
 Well controlled with lifestyle measures and omeprazole.

## 2024-01-28 NOTE — Assessment & Plan Note (Signed)
 Blood pressure is at goal for age and co-morbidities.   Recommendations: continue metoprolol  succinate 100 mg daily, olmesartan -hctz 20-12.5 mg daily - BP goal <130/80 - monitor and log blood pressures at home - check around the same time each day in a relaxed setting - Limit salt to <2000 mg/day - Follow DASH eating plan (heart healthy diet) - limit alcohol to 2 standard drinks per day for men and 1 per day for women - avoid tobacco products - get at least 2 hours of regular aerobic exercise weekly Patient aware of signs/symptoms requiring further/urgent evaluation.

## 2024-01-28 NOTE — Progress Notes (Signed)
 Established Patient Visit  Subjective:  Patient ID: Kelly Gallagher, female    DOB: 11/06/1970  Age: 53 y.o. MRN: [id number]  CC:  Chief Complaint  Patient presents with   Medical Management of Chronic Issues   Diabetes      HPI Kelly Gallagher is here for routine follow-up.    Discussed the use of AI scribe software for clinical note transcription with the patient, who gave verbal consent to proceed.  History of Present Illness Kelly Gallagher is a 53 year old female who presents with a non-healing spot on her back.  She reports a non-healing spot on her back that has been present for approximately two months. It is described as bumpy and feels like a scab. There is no drainage, but it may bleed slightly if scratched during sleep. There is no associated pain. She has been using Neosporin and covering it with a Band-Aid to prevent irritation from clothing.  She has a history of diabetes and is managing her condition with Ozempic , currently at a dose of 0.25 mg for eight weeks, with plans to increase to 0.5 mg. She reports an immediate improvement in blood sugar levels upon starting Ozempic , with no significant side effects such as vomiting or prolonged nausea. Her fasting blood sugar levels are generally under 200 mg/dL, with an average morning glucose of 194 mg/dL. Her HbA1c was previously 11.9% in July and has improved to  estimated 8.2% (per CGM) over the last month. She also takes Tresiba  120 units twice daily, metformin  1000 mg twice daily and 500 mg midday, and Lumejev 28 units with titration as needed.  She is on metoprolol  for blood pressure, with home readings around 125/65 mmHg. She also takes Lipitor for cholesterol and an omega-3 supplement. For mood, she uses Cymbalta  and hydroxyzine  as needed, which helps with sleep. She takes omeprazole  for reflux and vitamin D  2000 IU daily. She stopped B12 supplements following knee surgery eight weeks ago but plans to check her levels as  they were low in July.     Hypertension: - Medications: metoprolol  succinate 100 mg daily, olmesartan -hctz 20-12.5 mg daily - Compliance: good - Checking BP at home: 120s/70s - Denies any SOB, recurrent headaches, CP, vision changes, LE edema, dizziness, palpitations, or medication side effects.     Diabetes: - Checking glucose at home: yes, fasting < 200's - Medications: Tresiba  120 units BID, metformin  1,000 mg BID and 500 mg once a day; Lyumjev  28 units prior to meals (add additional 1 unit for each 50 points blood glucose is over 200 for a max dose of 35 units 3 times a day), and Ozempic  0.25 mg weekly (just finished week 8) - Compliance: good - Diet: low carb - Exercise: minimal, walking, swimming  - Eye exam: UTD, no retinopathy detected - Foot exam: UTD, 05/14/2023 - Microalbumin:  - Denies symptoms of hypoglycemia, polyuria, polydipsia, numbness extremities, foot ulcers/trauma, wounds that are not healing, medication side effects  Lab Results  Component Value Date   HGBA1C 9.9 (H) 01/28/2024       Hyperlipidemia: - medications: Lipitor 20 mg daily, Omega-3 - compliance: good - medication SEs: none The ASCVD Risk score (Arnett DK, et al., 2019) failed to calculate for the following reasons:   The valid total cholesterol range is 130 to 320 mg/dL       Mood follow-up: - Diagnosis: Anxiety - Treatment: Cymbalta  60 mg daily, hydroxyzine  10 mg PRN - Medication side effects: none -  SI/HI: none - Update:  Stable.    GERD: - managing with omeprazole  20 mg daily      Vitamin B12 deficiency: - Not current supplement  Lab Results  Component Value Date   VITAMINB12 215 01/28/2024     Vitamin D  deficiency: - 2,000 units daily     Wt Readings from Last 3 Encounters:  01/28/24 255 lb (115.7 kg)  12/06/23 253 lb 1.4 oz (114.8 kg)  10/20/23 245 lb (111.1 kg)   Past Medical History:  Diagnosis Date   Allergy 12/30/2020   seasonal   Anxiety 12/30/2020    Depression    Diabetes mellitus without complication (HCC)    GERD (gastroesophageal reflux disease)    Hypertension    PONV (postoperative nausea and vomiting)     Past Surgical History:  Procedure Laterality Date   CHOLECYSTECTOMY     COLONOSCOPY  01/13/2021   JMP   DILATION AND CURETTAGE OF UTERUS     KNEE ARTHROSCOPY W/ MENISCAL REPAIR Right 05/2010   KNEE ARTHROSCOPY WITH MEDIAL MENISECTOMY Left 12/06/2023   Procedure: ARTHROSCOPY, KNEE, WITH MEDIAL MENISCECTOMY and chondroplasty, loose body excision;  Surgeon: Yvone Rush, MD;  Location: [facility];  Service: Orthopedics;  Laterality: Left;    Family History  Problem Relation Age of Onset   Colon polyps Father    Cancer Father        Head and neck cancer. Has PEG   Hearing loss Father    Lupus Sister    Breast cancer Other    Breast cancer Other    Anxiety disorder Mother    Depression Mother    Diabetes Mother    Early death Mother    Hypertension Mother    Miscarriages / Stillbirths Mother    Anxiety disorder Sister    Diabetes Sister    Hypertension Sister    Arthritis Brother    Cancer Paternal Uncle    Colon cancer Neg Hx    Esophageal cancer Neg Hx    Rectal cancer Neg Hx    Stomach cancer Neg Hx     Social History   Socioeconomic History   Marital status: Married    Spouse name: Not on file   Number of children: Not on file   Years of education: Not on file   Highest education level: Some college, no degree  Occupational History   Not on file  Tobacco Use   Smoking status: Never   Smokeless tobacco: Never  Vaping Use   Vaping status: Never Used  Substance and Sexual Activity   Alcohol use: Never    Alcohol/week: 1.0 standard drink of alcohol    Types: 1 Glasses of wine per week   Drug use: Never   Sexual activity: Yes    Partners: Male    Birth control/protection: None  Other Topics Concern   Not on file  Social History Narrative   Not on file   Social Drivers of  Health   Financial Resource Strain: Low Risk  (01/27/2024)   Overall Financial Resource Strain (CARDIA)    Difficulty of Paying Living Expenses: Not very hard  Food Insecurity: No Food Insecurity (01/27/2024)   Hunger Vital Sign    Worried About Running Out of Food in the Last Year: Never true    Ran Out of Food in the Last Year: Never true  Transportation Needs: No Transportation Needs (01/27/2024)   PRAPARE - Administrator, Civil Service (Medical): No  Lack of Transportation (Non-Medical): No  Physical Activity: Inactive (01/27/2024)   Exercise Vital Sign    Days of Exercise per Week: 0 days    Minutes of Exercise per Session: Not on file  Stress: Stress Concern Present (01/27/2024)   [employer] of Occupational Health - Occupational Stress Questionnaire    Feeling of Stress: To some extent  Social Connections: Socially Integrated (01/27/2024)   Social Connection and Isolation Panel    Frequency of Communication with Friends and Family: More than three times a week    Frequency of Social Gatherings with Friends and Family: Once a week    Attends Religious Services: More than 4 times per year    Active Member of [employer] or Organizations: Yes    Attends Engineer, Structural: More than 4 times per year    Marital Status: Married  Catering Manager Violence: Not At Risk (06/04/2022)   Humiliation, Afraid, Rape, and Kick questionnaire    Fear of Current or Ex-Partner: No    Emotionally Abused: No    Physically Abused: No    Sexually Abused: No    ROS All ROS negative except what is listed in the HPI.   Objective:   Today's Vitals: BP (!) 133/57   Pulse 70   Ht 5' 5 (1.651 m)   Wt 255 lb (115.7 kg)   SpO2 97%   BMI 42.43 kg/m   Physical Exam Vitals reviewed.  Constitutional:      Appearance: Normal appearance. She is obese.  Cardiovascular:     Rate and Rhythm: Normal rate and regular rhythm.     Heart sounds: Normal heart sounds.  Pulmonary:      Effort: Pulmonary effort is normal.     Breath sounds: Normal breath sounds.  Skin:    General: Skin is warm and dry.     Findings: Lesion present.     Comments: See photo of ~ 1 cm raised lesion to right low back, no warmth, fluctuance, or drainage  Neurological:     Mental Status: She is alert and oriented to person, place, and time.  Psychiatric:        Mood and Affect: Mood normal.        Behavior: Behavior normal.        Thought Content: Thought content normal.        Judgment: Judgment normal.        Assessment & Plan:   Problem List Items Addressed This Visit       Active Problems   Type 2 diabetes mellitus, with long-term current use of insulin  (HCC) - Primary (Chronic)   Restarted Ozempic  about 2 months ago (previously caused n/v), but doing well so far. Planning to increase dose next week.  - Continue Tresiba  120 units BID, metformin  1000 mg BID, and 500 mg daily. - Ordered A1c test. - Monitor for side effects with dose changes, particularly nausea.      Relevant Orders   Hemoglobin A1c (Completed)   Comprehensive metabolic panel with GFR (Completed)   Hypertension associated with diabetes (HCC) (Chronic)   Blood pressure is at goal for age and co-morbidities.   Recommendations: continue metoprolol  succinate 100 mg daily, olmesartan -hctz 20-12.5 mg daily - BP goal <130/80 - monitor and log blood pressures at home - check around the same time each day in a relaxed setting - Limit salt to <2000 mg/day - Follow DASH eating plan (heart healthy diet) - limit alcohol to 2 standard drinks per day  for men and 1 per day for women - avoid tobacco products - get at least 2 hours of regular aerobic exercise weekly Patient aware of signs/symptoms requiring further/urgent evaluation.       Generalized anxiety disorder (Chronic)   On Duloxetine  60mg  daily and PRN hydroxyzine . Stable. No SI/HI. -Continue current regimen.       Hyperlipidemia associated with type 2  diabetes mellitus (HCC) (Chronic)   Medication management: Lipitor 20 mg daily, Omega-3 Lifestyle factors for lowering cholesterol include:  Diet therapy - heart-healthy diet rich in fruits, veggies, fiber-rich whole grains, lean meats, chicken, fish (at least twice a week), fat-free or 1% dairy products; foods low in saturated/trans fats, cholesterol, sodium, and sugar. Mediterranean diet has shown to be very heart healthy. Regular exercise - recommend at least 30 minutes a day, 5 times per week Weight management        Morbid obesity (HCC) (Chronic)   Encouraged healthy diet and regular exercise        GERD (gastroesophageal reflux disease) (Chronic)   Well controlled with lifestyle measures and omeprazole .       Vitamin B12 deficiency (Chronic)   No recent supplementation. Labs ordered      Relevant Orders   Vitamin B12 (Completed)   Vitamin D  deficiency (Chronic)   Continue supplementation. Labs ordered.       Other Visit Diagnoses       Skin lesion     - Prescribed doxycycline  for 5 days. - Advised warm compresses and keeping the area clean. - Referred to dermatology for further evaluation  - Instructed to monitor for any drastic changes and report if she occurs.   Relevant Medications   doxycycline  (VIBRA -TABS) 100 MG tablet   Other Relevant Orders   Ambulatory referral to Dermatology        Follow-up: Return in about 3 months (around 04/29/2024) for chronic disease management.   Kelly FURY Almarie, DNP, FNP-C  I,Emily Lagle,acting as a neurosurgeon for Kelly KATHEE Almarie, NP.,have documented all relevant documentation on the behalf of Kelly KATHEE Almarie, NP.  I, Kelly KATHEE Almarie, NP, have reviewed all documentation for this visit. The documentation on 01/28/2024 for the exam, diagnosis, procedures, and orders are all accurate and complete.

## 2024-01-28 NOTE — Assessment & Plan Note (Signed)
 Encouraged healthy diet and regular exercise

## 2024-01-28 NOTE — Assessment & Plan Note (Signed)
 Continue supplementation. Labs ordered.

## 2024-01-28 NOTE — Assessment & Plan Note (Signed)
 On Duloxetine  60mg  daily and PRN hydroxyzine . Stable. No SI/HI. -Continue current regimen.

## 2024-01-28 NOTE — Assessment & Plan Note (Signed)
 Restarted Ozempic  about 2 months ago (previously caused n/v), but doing well so far. Planning to increase dose next week.  - Continue Tresiba  120 units BID, metformin  1000 mg BID, and 500 mg daily. - Ordered A1c test. - Monitor for side effects with dose changes, particularly nausea.

## 2024-01-28 NOTE — Assessment & Plan Note (Signed)
 No recent supplementation. Labs ordered

## 2024-01-28 NOTE — Assessment & Plan Note (Signed)
 Medication management: Lipitor 20 mg daily, Omega-3 Lifestyle factors for lowering cholesterol include:  Diet therapy - heart-healthy diet rich in fruits, veggies, fiber-rich whole grains, lean meats, chicken, fish (at least twice a week), fat-free or 1% dairy products; foods low in saturated/trans fats, cholesterol, sodium, and sugar. Mediterranean diet has shown to be very heart healthy. Regular exercise - recommend at least 30 minutes a day, 5 times per week Weight management

## 2024-01-31 ENCOUNTER — Ambulatory Visit: Payer: Self-pay | Admitting: Family Medicine

## 2024-01-31 ENCOUNTER — Encounter: Payer: Self-pay | Admitting: Pharmacist

## 2024-01-31 DIAGNOSIS — R748 Abnormal levels of other serum enzymes: Secondary | ICD-10-CM

## 2024-02-01 ENCOUNTER — Other Ambulatory Visit: Payer: Self-pay

## 2024-02-03 ENCOUNTER — Other Ambulatory Visit (HOSPITAL_COMMUNITY): Payer: Self-pay

## 2024-02-04 ENCOUNTER — Other Ambulatory Visit (HOSPITAL_COMMUNITY): Payer: Self-pay

## 2024-02-09 ENCOUNTER — Other Ambulatory Visit (HOSPITAL_COMMUNITY): Payer: Self-pay

## 2024-03-05 ENCOUNTER — Other Ambulatory Visit: Payer: Self-pay

## 2024-03-05 ENCOUNTER — Other Ambulatory Visit: Payer: Self-pay | Admitting: Student

## 2024-03-05 DIAGNOSIS — I10 Essential (primary) hypertension: Secondary | ICD-10-CM

## 2024-03-05 DIAGNOSIS — F411 Generalized anxiety disorder: Secondary | ICD-10-CM

## 2024-03-05 MED FILL — Metoprolol Succinate Tab ER 24HR 100 MG (Tartrate Equiv): ORAL | 90 days supply | Qty: 90 | Fill #1 | Status: CN

## 2024-03-06 ENCOUNTER — Other Ambulatory Visit (HOSPITAL_COMMUNITY): Payer: Self-pay

## 2024-03-06 ENCOUNTER — Other Ambulatory Visit: Payer: Self-pay

## 2024-03-06 MED ORDER — OZEMPIC (0.25 OR 0.5 MG/DOSE) 2 MG/3ML ~~LOC~~ SOPN
0.5000 mg | PEN_INJECTOR | SUBCUTANEOUS | 0 refills | Status: DC
  Filled 2024-03-06 – 2024-03-09 (×2): qty 3, 28d supply, fill #0

## 2024-03-06 NOTE — Telephone Encounter (Signed)
 NOT [facility] PATIENT

## 2024-03-07 ENCOUNTER — Other Ambulatory Visit (HOSPITAL_COMMUNITY): Payer: Self-pay

## 2024-03-07 ENCOUNTER — Other Ambulatory Visit: Payer: Self-pay

## 2024-03-07 MED FILL — Olmesartan Medoxomil-Hydrochlorothiazide Tab 20-12.5 MG: ORAL | 90 days supply | Qty: 90 | Fill #0 | Status: CN

## 2024-03-07 MED FILL — Duloxetine HCl Enteric Coated Pellets Cap 60 MG (Base Eq): ORAL | 30 days supply | Qty: 30 | Fill #0 | Status: CN

## 2024-03-08 ENCOUNTER — Other Ambulatory Visit: Payer: Self-pay

## 2024-03-09 ENCOUNTER — Other Ambulatory Visit (HOSPITAL_COMMUNITY): Payer: Self-pay

## 2024-03-09 ENCOUNTER — Other Ambulatory Visit: Payer: Self-pay | Admitting: Family Medicine

## 2024-03-09 ENCOUNTER — Other Ambulatory Visit: Payer: Self-pay

## 2024-03-09 DIAGNOSIS — F411 Generalized anxiety disorder: Secondary | ICD-10-CM

## 2024-03-09 DIAGNOSIS — E1142 Type 2 diabetes mellitus with diabetic polyneuropathy: Secondary | ICD-10-CM

## 2024-03-09 MED ORDER — LYUMJEV KWIKPEN 100 UNIT/ML ~~LOC~~ SOPN
28.0000 [IU] | PEN_INJECTOR | Freq: Three times a day (TID) | SUBCUTANEOUS | 0 refills | Status: AC
  Filled 2024-03-09: qty 90, 86d supply, fill #0

## 2024-03-09 MED ORDER — HYDROXYZINE HCL 10 MG PO TABS
10.0000 mg | ORAL_TABLET | Freq: Three times a day (TID) | ORAL | 0 refills | Status: AC | PRN
  Filled 2024-03-09 – 2024-04-08 (×3): qty 270, 90d supply, fill #0

## 2024-03-09 MED FILL — Continuous Glucose System Sensor: 90 days supply | Qty: 6 | Fill #0 | Status: AC

## 2024-03-09 MED FILL — Olmesartan Medoxomil-Hydrochlorothiazide Tab 20-12.5 MG: ORAL | 90 days supply | Qty: 90 | Fill #0 | Status: AC

## 2024-03-09 MED FILL — Duloxetine HCl Enteric Coated Pellets Cap 60 MG (Base Eq): ORAL | 30 days supply | Qty: 30 | Fill #0 | Status: AC

## 2024-03-10 ENCOUNTER — Other Ambulatory Visit (HOSPITAL_COMMUNITY): Payer: Self-pay

## 2024-03-28 ENCOUNTER — Other Ambulatory Visit: Payer: Self-pay

## 2024-03-28 ENCOUNTER — Other Ambulatory Visit (HOSPITAL_COMMUNITY): Payer: Self-pay

## 2024-03-28 DIAGNOSIS — Z794 Long term (current) use of insulin: Secondary | ICD-10-CM

## 2024-03-28 MED ORDER — TRESIBA FLEXTOUCH 200 UNIT/ML ~~LOC~~ SOPN
PEN_INJECTOR | SUBCUTANEOUS | 1 refills | Status: AC
  Filled 2024-03-28: qty 114, 90d supply, fill #0
  Filled 2024-03-28: qty 114, 95d supply, fill #0

## 2024-03-29 ENCOUNTER — Other Ambulatory Visit (HOSPITAL_COMMUNITY): Payer: Self-pay

## 2024-03-31 ENCOUNTER — Other Ambulatory Visit (HOSPITAL_BASED_OUTPATIENT_CLINIC_OR_DEPARTMENT_OTHER): Payer: Self-pay

## 2024-03-31 ENCOUNTER — Other Ambulatory Visit (HOSPITAL_COMMUNITY): Payer: Self-pay

## 2024-04-03 ENCOUNTER — Other Ambulatory Visit: Payer: Self-pay

## 2024-04-03 ENCOUNTER — Other Ambulatory Visit (HOSPITAL_COMMUNITY): Payer: Self-pay

## 2024-04-07 ENCOUNTER — Other Ambulatory Visit: Payer: Self-pay | Admitting: Pharmacist

## 2024-04-08 ENCOUNTER — Other Ambulatory Visit (HOSPITAL_COMMUNITY): Payer: Self-pay

## 2024-04-10 ENCOUNTER — Other Ambulatory Visit: Payer: Self-pay

## 2024-04-10 ENCOUNTER — Other Ambulatory Visit (HOSPITAL_COMMUNITY): Payer: Self-pay

## 2024-04-10 MED FILL — Metoprolol Succinate Tab ER 24HR 100 MG (Tartrate Equiv): ORAL | 90 days supply | Qty: 90 | Fill #1 | Status: AC

## 2024-04-12 ENCOUNTER — Other Ambulatory Visit: Payer: Self-pay

## 2024-04-13 ENCOUNTER — Other Ambulatory Visit: Payer: Self-pay

## 2024-04-13 MED ORDER — OZEMPIC (0.25 OR 0.5 MG/DOSE) 2 MG/3ML ~~LOC~~ SOPN
0.2500 mg | PEN_INJECTOR | SUBCUTANEOUS | 0 refills | Status: AC
  Filled 2024-04-13: qty 3, 56d supply, fill #0
  Filled 2024-04-21: qty 3, fill #0
  Filled 2024-04-21: qty 3, 56d supply, fill #0

## 2024-04-14 ENCOUNTER — Other Ambulatory Visit: Payer: Self-pay | Admitting: Pharmacist

## 2024-04-14 ENCOUNTER — Other Ambulatory Visit: Payer: Self-pay

## 2024-04-14 ENCOUNTER — Telehealth: Payer: Self-pay | Admitting: Pharmacist

## 2024-04-14 NOTE — Telephone Encounter (Signed)
 Attempt (2nd) was made to contact patient by phone today for follow up by Clinical Pharmacist regarding diabetes.  Unable to reach patient. LM on VM with my contact number [phone].

## 2024-04-21 ENCOUNTER — Other Ambulatory Visit: Payer: Self-pay

## 2024-04-21 ENCOUNTER — Encounter: Payer: Self-pay | Admitting: Pharmacist

## 2024-04-21 ENCOUNTER — Other Ambulatory Visit: Payer: Self-pay | Admitting: Family Medicine

## 2024-04-21 DIAGNOSIS — E1142 Type 2 diabetes mellitus with diabetic polyneuropathy: Secondary | ICD-10-CM

## 2024-04-21 DIAGNOSIS — K219 Gastro-esophageal reflux disease without esophagitis: Secondary | ICD-10-CM

## 2024-04-21 MED ORDER — OMEPRAZOLE 20 MG PO CPDR
20.0000 mg | DELAYED_RELEASE_CAPSULE | Freq: Every day | ORAL | 0 refills | Status: AC
  Filled 2024-04-21: qty 90, 90d supply, fill #0

## 2024-04-21 MED ORDER — METFORMIN HCL ER 500 MG PO TB24
500.0000 mg | ORAL_TABLET | Freq: Every day | ORAL | 0 refills | Status: AC
  Filled 2024-04-21: qty 90, 90d supply, fill #0

## 2024-04-24 ENCOUNTER — Other Ambulatory Visit: Payer: Self-pay | Admitting: Family Medicine

## 2024-04-24 ENCOUNTER — Other Ambulatory Visit (HOSPITAL_COMMUNITY): Payer: Self-pay

## 2024-04-24 DIAGNOSIS — E1142 Type 2 diabetes mellitus with diabetic polyneuropathy: Secondary | ICD-10-CM

## 2024-04-24 MED ORDER — ATORVASTATIN CALCIUM 20 MG PO TABS
20.0000 mg | ORAL_TABLET | Freq: Every day | ORAL | 0 refills | Status: AC
  Filled 2024-04-24: qty 90, 90d supply, fill #0

## 2024-04-24 MED ORDER — METFORMIN HCL 1000 MG PO TABS
1000.0000 mg | ORAL_TABLET | Freq: Two times a day (BID) | ORAL | 0 refills | Status: AC
  Filled 2024-04-24: qty 180, 90d supply, fill #0

## 2024-04-26 ENCOUNTER — Other Ambulatory Visit: Payer: Self-pay

## 2024-04-28 ENCOUNTER — Telehealth: Payer: Self-pay | Admitting: Pharmacist

## 2024-04-28 ENCOUNTER — Other Ambulatory Visit: Payer: Self-pay | Admitting: Pharmacist

## 2024-04-28 DIAGNOSIS — E1142 Type 2 diabetes mellitus with diabetic polyneuropathy: Secondary | ICD-10-CM

## 2024-04-28 NOTE — Telephone Encounter (Signed)
 Opened in error

## 2024-04-28 NOTE — Progress Notes (Cosign Needed)
 "  04/28/2024 Name: Kelly Gallagher MRN: [id number] DOB: 03/05/1971  Chief Complaint  Patient presents with   Diabetes    Kelly Gallagher is a 54 y.o. year old female who presented for a telephone visit.   They were referred to the pharmacist by their PCP for assistance in managing diabetes.    Subjective:   Care Team: Primary Care Provider: Almarie Waddell NOVAK, NP ; Next Scheduled Visit: 05/04/2024    Medication Access/Adherence  Current Pharmacy:  [facility] - [facility] [address] [place] [address] Phone: [phone] Fax: [phone]  [facility] [place] - [facility] [address] [place] [address] Phone: [phone] Fax: [phone]  [facility] [phone] - [place], [place] - [address] AT [facility] OF [address] [place] [address] Phone: [phone] Fax: [phone]  [place] - [facility] [address] [place] [address] Phone: [phone] Fax: [phone]   Patient reports affordability concerns with their medications: No  - Patient is currently enrolled in Diabetes Management Program for [facility] Employees.  Patient reports access/transportation concerns to their pharmacy: No    Diabetes:  Current medications:  Ozempic  0.25mg  weekly (restart 1 week ago). She has gotten up to 0.5mg  dose but after a few week at 0.5mg  she experience nausea that lasted for 2 or 3 days. She was unable to eat anything but a few bites of toast. She has restarted Ozempic  at lower dose of 0.25mg  on 04/21/2024 and has not had any nausea but reports that she does not feel that it has worked well at lowering her blood glucose or suppressing her appetite.   Tresiba  U200 - inject 120 units twice a day Insulin  lyumjev  - 30 to 35 units with each meal 3 times a day Metformin  1000mg  twice a day Metformin  ER 500mg  once a day  Medications  tried in the past:  Mounjaro  and Ozempic  - caused severe nausea and diarrhea (on higher doses) - seen at hospital for dehydration. She had stopped all GLP 1 type agent around April 2025 and had gained about 20lbs back.  Jardiance  25mg  - took around 12/2021 but stopped due to frequent yeast infections.   Current glucose readings: see Continuous Glucose Monitor report below Using Libre 3+ sensors  Date of Download: 04/28/2024 % Time CGM is active: 93% Average Glucose: 274 mg/dL (previously was 807) Glucose Management Indicator: 9.9% (previously was 7.9%) Glucose Variability: 16.5% (Previously was 25.1%) (goal <36%) Time in Goal:  - Time in range 70-180: 1% (previously was 41%) - Time above range: 99% (previously was 59%) - Time below range: 0%  Observed patterns: Blood glucose has increased since she was off Ozempic  and has only improved minimally over the last week since she restarted Ozempic  0.25mg  weekly   Current medication access support: uses manufacturer coupon with Tresiba  at pharmacy   Objective:  Lab Results  Component Value Date   HGBA1C 9.9 (H) 01/28/2024    Lab Results  Component Value Date   CREATININE 0.68 01/28/2024   BUN 10 01/28/2024   NA 138 01/28/2024   K 4.5 01/28/2024   CL 98 01/28/2024   CO2 29 01/28/2024    Lab Results  Component Value Date   CHOL 119 09/30/2023   HDL 43.20 09/30/2023   LDLCALC 24 09/30/2023   TRIG 259.0 (H) 09/30/2023   CHOLHDL 3 09/30/2023  Medications Reviewed Today     Reviewed by Carla Milling, RPH-CPP (Pharmacist) on 04/28/24 at [phone]  Med List Status: <None>   Medication Order Taking? Sig Documenting Provider Last Dose Status Informant  atorvastatin  (LIPITOR) 20 MG tablet [id number]  Take 1 tablet (20 mg total) by mouth daily. Almarie Waddell NOVAK, NP  Active   B Complex Vitamins (B COMPLEX PO) [id number]  Take 1 capsule by mouth daily. [provider]  Active   Cholecalciferol (VITAMIN D3) 10 MCG (400 UNIT)  tablet [id number]  daily. [provider]  Active   Continuous Glucose Sensor (FREESTYLE LIBRE 3 PLUS SENSOR) MISC [id number]  Change sensor every 15 days. Almarie Waddell NOVAK, NP  Active   DULoxetine  (CYMBALTA ) 60 MG capsule [id number]  Take 1 capsule (60 mg total) by mouth daily. Almarie Waddell NOVAK, NP  Active   hydrOXYzine  (ATARAX ) 10 MG tablet [id number]  Take 1 tablet (10 mg total) by mouth 3 (three) times daily as needed. Almarie Waddell NOVAK, NP  Active   insulin  degludec (TRESIBA  FLEXTOUCH) 200 UNIT/ML FlexTouch Pen [id number] Yes Dose increase. Inject 120 Units into the skin in the morning and at bedtime. Dose increase.  Patient taking differently: 120 Units in the morning and at bedtime.   Almarie Waddell NOVAK, NP  Active   Insulin  Lispro-aabc (LYUMJEV  KWIKPEN) 100 UNIT/ML KwikPen [id number] Yes Inject 28 units prior to meals 3 times a day - add additional 1 unit for each 50 points blood glucose is over 200 for a max dose of 35 units 3 times a day (90 mL = 85 day supply)  Patient taking differently: Inject 35 Units into the skin 3 (three) times daily.   Almarie Waddell NOVAK, NP  Active   Insulin  Pen Needle (PEN NEEDLES) 31G X 5 MM MISC [id number]  Use with insulin  pens (both long acting and short acting) to inject insulin  up to 6 times per day. Domenica Harlene LABOR, MD  Active   metFORMIN  (GLUCOPHAGE ) 1000 MG tablet [id number]  Take 1 tablet (1,000 mg total) by mouth 2 (two) times daily with a meal. Almarie Waddell NOVAK, NP  Active   metFORMIN  (GLUCOPHAGE -XR) 500 MG 24 hr tablet [id number]  Take 1 tablet (500 mg total) by mouth daily with breakfast. Almarie Waddell NOVAK, NP  Active   metoprolol  succinate (TOPROL -XL) 100 MG 24 hr tablet [id number] Yes Take 1 tablet (100 mg total) by mouth daily. WITH OR IMMEDIATELY FOLLOWING A MEAL Almarie Waddell NOVAK, NP  Active   Multiple Vitamins-Calcium  (ONE-A-DAY WOMENS FORMULA PO) [id number]  Take 1 tablet by mouth daily. [provider]  Active   olmesartan -hydrochlorothiazide  (BENICAR  HCT)  20-12.5 MG tablet [id number] Yes Take 1 tablet by mouth daily. Almarie Waddell NOVAK, NP  Active   Omega-3 Fatty Acids  (FISH OIL ) 1000 MG CAPS [id number]  Take 1 capsule (1,000 mg total) by mouth daily. Sherlean Failing, MD  Active   omeprazole  (PRILOSEC) 20 MG capsule [id number] Yes Take 1 capsule (20 mg total) by mouth daily. Almarie Waddell NOVAK, NP  Active   ondansetron  (ZOFRAN -ODT) 4 MG disintegrating tablet [id number]  Dissolve 1 tablet (4 mg total) by mouth every 8 (eight) hours as needed for vomiting or nausea. Roselyn Carlin NOVAK, MD  Active   Probiotic Product (PROBIOTIC-10 PO) [id number]  Take by mouth. [provider]  Active   Semaglutide ,0.25 or 0.5MG /DOS, (OZEMPIC , 0.25 OR 0.5 MG/DOSE,) 2 MG/3ML SOPN [id number] Yes Inject 0.25 mg into the skin once a week. (Dose decreased due to nausea) Almarie,  Waddell NOVAK, NP  Active               Assessment/Plan:   Diabetes: Currently uncontrolled GMI has increased since she stopped Ozempic  for 2 week and restarted at lower dose of 0.25mg  weekly  - Recommend to continue Tresiba  U200 to 120 units twice a day - Continue insulin  lyumgev to 35 units with meals. (Add additional lyumgev - add 1 extra unit to base amount for each 50 points blood glucose is > 120) - Continue metformin  2500mg  / day and Januvia  100mg  daily  - Increase Ozempic  - 0.25mg  + 5 extra clicks (= 0.32mg  per dose) weekly (increasing slowly since patient has had GI side effects in past) - Reviewed blood glucose report. Continue to use Continuous Glucose Monitor to check blood glucose several times a day.  - Discussed next treatment steps. I think patient might be a good candidate for an insulin  pump. Sent  referral to PCP to review for Endocrinology  Follow Up Plan: check on Continuous Glucose Monitor in 5 to 7 days  Madelin Ray, PharmD Clinical Pharmacist [facility] [place]     "

## 2024-05-04 ENCOUNTER — Ambulatory Visit: Admitting: Family Medicine
